# Patient Record
Sex: Female | Born: 1935 | Race: White | Hispanic: No | Marital: Married | Smoking: Former smoker
Health system: Southern US, Community
[De-identification: ages and names within clinical notes are randomized; demographics above are authoritative.]

## PROBLEM LIST (undated history)

## (undated) DIAGNOSIS — M1990 Unspecified osteoarthritis, unspecified site: Secondary | ICD-10-CM

## (undated) DIAGNOSIS — G43909 Migraine, unspecified, not intractable, without status migrainosus: Secondary | ICD-10-CM

## (undated) DIAGNOSIS — F0390 Unspecified dementia without behavioral disturbance: Secondary | ICD-10-CM

## (undated) DIAGNOSIS — E039 Hypothyroidism, unspecified: Secondary | ICD-10-CM

## (undated) DIAGNOSIS — I10 Essential (primary) hypertension: Secondary | ICD-10-CM

## (undated) DIAGNOSIS — E7800 Pure hypercholesterolemia, unspecified: Secondary | ICD-10-CM

## (undated) DIAGNOSIS — Z8739 Personal history of other diseases of the musculoskeletal system and connective tissue: Secondary | ICD-10-CM

## (undated) DIAGNOSIS — Z9289 Personal history of other medical treatment: Secondary | ICD-10-CM

## (undated) HISTORY — PX: CHOLECYSTECTOMY: SHX55

## (undated) HISTORY — PX: DILATION AND CURETTAGE OF UTERUS: SHX78

## (undated) HISTORY — PX: JOINT REPLACEMENT: SHX530

## (undated) HISTORY — PX: ABDOMINAL HYSTERECTOMY: SHX81

## (undated) HISTORY — PX: TOTAL HIP ARTHROPLASTY: SHX124

## (undated) HISTORY — PX: FRACTURE SURGERY: SHX138

## (undated) HISTORY — PX: TUBAL LIGATION: SHX77

---

## 1986-07-04 HISTORY — PX: ANKLE FRACTURE SURGERY: SHX122

## 2000-02-29 ENCOUNTER — Encounter: Payer: Self-pay | Admitting: Internal Medicine

## 2000-02-29 ENCOUNTER — Ambulatory Visit (HOSPITAL_COMMUNITY): Admission: RE | Admit: 2000-02-29 | Discharge: 2000-02-29 | Payer: Self-pay | Admitting: Internal Medicine

## 2000-07-18 ENCOUNTER — Encounter: Payer: Self-pay | Admitting: Orthopedic Surgery

## 2000-07-19 ENCOUNTER — Encounter: Payer: Self-pay | Admitting: Orthopedic Surgery

## 2000-07-19 ENCOUNTER — Inpatient Hospital Stay (HOSPITAL_COMMUNITY): Admission: RE | Admit: 2000-07-19 | Discharge: 2000-07-21 | Payer: Self-pay | Admitting: Orthopedic Surgery

## 2001-03-07 ENCOUNTER — Ambulatory Visit (HOSPITAL_COMMUNITY): Admission: RE | Admit: 2001-03-07 | Discharge: 2001-03-07 | Payer: Self-pay | Admitting: Internal Medicine

## 2001-03-07 ENCOUNTER — Encounter: Payer: Self-pay | Admitting: Internal Medicine

## 2002-03-08 ENCOUNTER — Ambulatory Visit (HOSPITAL_COMMUNITY): Admission: RE | Admit: 2002-03-08 | Discharge: 2002-03-08 | Payer: Self-pay | Admitting: Internal Medicine

## 2002-03-08 ENCOUNTER — Encounter: Payer: Self-pay | Admitting: Internal Medicine

## 2003-03-11 ENCOUNTER — Encounter: Payer: Self-pay | Admitting: Internal Medicine

## 2003-03-11 ENCOUNTER — Ambulatory Visit (HOSPITAL_COMMUNITY): Admission: RE | Admit: 2003-03-11 | Discharge: 2003-03-11 | Payer: Self-pay | Admitting: Internal Medicine

## 2003-12-08 ENCOUNTER — Encounter (INDEPENDENT_AMBULATORY_CARE_PROVIDER_SITE_OTHER): Payer: Self-pay | Admitting: Specialist

## 2003-12-08 ENCOUNTER — Ambulatory Visit (HOSPITAL_COMMUNITY): Admission: RE | Admit: 2003-12-08 | Discharge: 2003-12-08 | Payer: Self-pay | Admitting: Gastroenterology

## 2004-03-26 ENCOUNTER — Ambulatory Visit (HOSPITAL_COMMUNITY): Admission: RE | Admit: 2004-03-26 | Discharge: 2004-03-26 | Payer: Self-pay | Admitting: Internal Medicine

## 2004-11-17 ENCOUNTER — Encounter: Admission: RE | Admit: 2004-11-17 | Discharge: 2004-11-17 | Payer: Self-pay | Admitting: Internal Medicine

## 2005-01-18 ENCOUNTER — Encounter: Admission: RE | Admit: 2005-01-18 | Discharge: 2005-01-18 | Payer: Self-pay | Admitting: Orthopedic Surgery

## 2005-01-20 ENCOUNTER — Ambulatory Visit (HOSPITAL_BASED_OUTPATIENT_CLINIC_OR_DEPARTMENT_OTHER): Admission: RE | Admit: 2005-01-20 | Discharge: 2005-01-20 | Payer: Self-pay | Admitting: Orthopedic Surgery

## 2005-01-20 ENCOUNTER — Ambulatory Visit (HOSPITAL_COMMUNITY): Admission: RE | Admit: 2005-01-20 | Discharge: 2005-01-20 | Payer: Self-pay | Admitting: Orthopedic Surgery

## 2005-05-17 ENCOUNTER — Ambulatory Visit (HOSPITAL_COMMUNITY): Admission: RE | Admit: 2005-05-17 | Discharge: 2005-05-17 | Payer: Self-pay | Admitting: Internal Medicine

## 2006-04-26 ENCOUNTER — Encounter: Admission: RE | Admit: 2006-04-26 | Discharge: 2006-04-26 | Payer: Self-pay | Admitting: Internal Medicine

## 2006-06-15 ENCOUNTER — Encounter: Admission: RE | Admit: 2006-06-15 | Discharge: 2006-06-15 | Payer: Self-pay | Admitting: Internal Medicine

## 2008-04-30 ENCOUNTER — Ambulatory Visit (HOSPITAL_COMMUNITY): Admission: RE | Admit: 2008-04-30 | Discharge: 2008-04-30 | Payer: Self-pay | Admitting: Internal Medicine

## 2008-12-15 ENCOUNTER — Ambulatory Visit: Admission: RE | Admit: 2008-12-15 | Discharge: 2008-12-15 | Payer: Self-pay | Admitting: Orthopedic Surgery

## 2008-12-19 ENCOUNTER — Inpatient Hospital Stay (HOSPITAL_COMMUNITY): Admission: RE | Admit: 2008-12-19 | Discharge: 2008-12-22 | Payer: Self-pay | Admitting: Orthopedic Surgery

## 2009-07-07 ENCOUNTER — Ambulatory Visit (HOSPITAL_COMMUNITY): Admission: RE | Admit: 2009-07-07 | Discharge: 2009-07-07 | Payer: Self-pay | Admitting: Internal Medicine

## 2010-10-11 LAB — CROSSMATCH
ABO/RH(D): A NEG
Antibody Screen: POSITIVE
DAT, IgG: NEGATIVE

## 2010-10-11 LAB — BASIC METABOLIC PANEL
BUN: 15 mg/dL (ref 6–23)
BUN: 8 mg/dL (ref 6–23)
CO2: 29 mEq/L (ref 19–32)
Calcium: 8.5 mg/dL (ref 8.4–10.5)
Calcium: 8.8 mg/dL (ref 8.4–10.5)
Chloride: 98 mEq/L (ref 96–112)
Creatinine, Ser: 1.05 mg/dL (ref 0.4–1.2)
Creatinine, Ser: 1.19 mg/dL (ref 0.4–1.2)
GFR calc Af Amer: 53 mL/min — ABNORMAL LOW (ref >60)
GFR calc Af Amer: >60 mL/min (ref >60)
GFR calc non Af Amer: 44 mL/min — ABNORMAL LOW (ref >60)
GFR calc non Af Amer: 58 mL/min — ABNORMAL LOW (ref >60)
Glucose, Bld: 138 mg/dL — ABNORMAL HIGH (ref 70–99)
Potassium: 3.4 mEq/L — ABNORMAL LOW (ref 3.5–5.1)
Potassium: 4.3 mEq/L (ref 3.5–5.1)
Sodium: 133 mEq/L — ABNORMAL LOW (ref 135–145)

## 2010-10-11 LAB — COMPREHENSIVE METABOLIC PANEL
ALT: 19 U/L (ref 0–35)
AST: 33 U/L (ref 0–37)
Albumin: 4.5 g/dL (ref 3.5–5.2)
Creatinine, Ser: 1.23 mg/dL — ABNORMAL HIGH (ref 0.4–1.2)
Glucose, Bld: 112 mg/dL — ABNORMAL HIGH (ref 70–99)
Potassium: 4.3 mEq/L (ref 3.5–5.1)
Sodium: 136 mEq/L (ref 135–145)
Total Bilirubin: 0.8 mg/dL (ref 0.3–1.2)
Total Protein: 7.8 g/dL (ref 6.0–8.3)

## 2010-10-11 LAB — CBC
HCT: 30.6 % — ABNORMAL LOW (ref 36.0–46.0)
HCT: 31.3 % — ABNORMAL LOW (ref 36.0–46.0)
HCT: 34.7 % — ABNORMAL LOW (ref 36.0–46.0)
Hemoglobin: 10.5 g/dL — ABNORMAL LOW (ref 12.0–15.0)
Hemoglobin: 10.8 g/dL — ABNORMAL LOW (ref 12.0–15.0)
Hemoglobin: 11.8 g/dL — ABNORMAL LOW (ref 12.0–15.0)
MCHC: 34.2 g/dL (ref 30.0–36.0)
MCHC: 34.4 g/dL (ref 30.0–36.0)
MCV: 93.5 fL (ref 78.0–100.0)
Platelets: 225 10*3/uL (ref 150–400)
Platelets: 256 10*3/uL (ref 150–400)
RDW: 13 % (ref 11.5–15.5)
RDW: 13.3 % (ref 11.5–15.5)
RDW: 13.3 % (ref 11.5–15.5)
WBC: 7 10*3/uL (ref 4.0–10.5)
WBC: 7.8 10*3/uL (ref 4.0–10.5)
WBC: 7.1 10*3/uL (ref 4.0–10.5)

## 2010-10-11 LAB — PROTIME-INR
INR: 1.1 (ref 0.00–1.49)
Prothrombin Time: 14.5 seconds (ref 11.6–15.2)
Prothrombin Time: 15.9 seconds — ABNORMAL HIGH (ref 11.6–15.2)
Prothrombin Time: 17.4 seconds — ABNORMAL HIGH (ref 11.6–15.2)
Prothrombin Time: 13.2 seconds (ref 11.6–15.2)

## 2010-10-11 LAB — APTT: aPTT: 31 seconds (ref 24–37)

## 2010-11-16 NOTE — Op Note (Signed)
[name], Diamond [name] NO.:  [id number]   MEDICAL RECORD NO.:  [id number]          PATIENT TYPE:  INP   LOCATION:  5040                         FACILITY:  [facility]   PHYSICIAN:  Nadara Mustard, MD     DATE OF BIRTH:  09/20/1926   DATE OF PROCEDURE:  12/19/2008  DATE OF DISCHARGE:                               OPERATIVE REPORT   PREOPERATIVE DIAGNOSIS:  Osteoarthritis, right hip.   POSTOPERATIVE DIAGNOSIS:  Osteoarthritis, right hip.   PROCEDURE:  Right total hip arthroplasty with DePuy components, 54-mm  acetabulum, 10-degree +4 polyethylene liner, 36-mm head with a -2 neck.   SURGEON:  Nadara Mustard, MD   ANESTHESIA:  General.   ESTIMATED BLOOD LOSS:  Minimal.   ANTIBIOTICS:  Vancomycin 1 g preoperatively and intraoperatively.   DISPOSITION:  To PACU in stable condition.   INDICATIONS FOR PROCEDURE:  The patient is an 77 year old woman with an  abductor lurch pain with activities of daily living who has failed  conservative care and presents at this time for right total hip  arthroplasty.  Risks and benefits were discussed including infection,  neurovascular injury, persistent pain, dislocation, DVT, pulmonary  embolus, and need for additional surgery.  The patient states she  understands and wished to proceed at this time.   DESCRIPTION FOR PROCEDURE:  The patient was brought to OR room #1 and  underwent a general anesthetic.  After adequate level of anesthesia was  obtained, the patient was placed in the left lateral decubitus position  with the right side up and the right lower extremity was prepped using  DuraPrep and draped into a sterile field.  An Collier Flowers was used to cover  all exposed skin.  A posterolateral incision was made and this carried  down.  The tensor fascia lata was split.  Retractors were placed.  The  piriformis and short external rotators and capsule was tagged, cut, and  retracted off the femoral neck.  The femoral head was  dislocated.  The  femoral neck cut was made 1 cm proximal to the calcar.  Attention was  first focused on the acetabulum.  The acetabulum was sequentially reamed  up to a 54-mm acetabulum.  The acetabular was placed in 45 degrees of  abduction and 20 degrees of anteversion.  One screw 20 mm in length was  placed superiorly to stabilize the cup.  The 10-degree +4 polyethylene  liner was placed.  The femur was then sequentially broached up to a size  10.  The size 10 femur was then trialed with different size, -2 neck,  had equal leg length, negative shuck, full flexion of 120 degrees, full  abduction, internal rotation of 70 degrees and the hip was stable.  The  trial components were then removed.  The hip was irrigated continuously  throughout the case.  The final size 10 stem +2, -2 head was inserted.  This was reduced, again placed through a full range of motion.  She had  full extension, external rotation, flexion, internal rotation of 70  degrees with stable hip.  The wound was irrigated with  normal saline.  The piriformis, short external rotators, and capsule were reapproximated  using #1 Ethibond.  The tensor fascia lata was closed using #1 Vicryl.  Subcu was closed using 2-0 Vicryl.  Skin was closed using approximating  staples.  The wound was covered with Adaptic orthopedic sponges, ABD  dressing, and Hypafix tape.  The patient was extubated and taken to the  PACU in stable condition.      Nadara Mustard, MD  Electronically Signed     MVD/MEDQ  D:  12/19/2008  T:  12/20/2008  Job:  [id number]

## 2010-11-19 NOTE — Op Note (Signed)
NAMEJULINE, SANDERFORD NO.:  [id number]   MEDICAL RECORD NO.:  [id number]          PATIENT TYPE:  AMB   LOCATION:  DSC                          FACILITY:  [facility]   PHYSICIAN:  Nadara Mustard, MD     DATE OF BIRTH:  Jul 24, 1926   DATE OF PROCEDURE:  01/20/2005  DATE OF DISCHARGE:                                 OPERATIVE REPORT   PREOPERATIVE DIAGNOSIS:  Hallux valgus deformity of right great toe.   POSTOPERATIVE DIAGNOSIS:  Hallux valgus deformity of right great toe.   PROCEDURE:  Chevron osteotomy of first metatarsal, right great toe.   SURGEON.:  Nadara Mustard, MD   ANESTHESIA:  Ankle block.   ESTIMATED BLOOD LOSS:  Minimal.   ANTIBIOTICS:  Cleocin 300 mg IV.   DRAINS:  None.   COMPLICATIONS:  None.   TOURNIQUET TIME:  Esmarch at the ankle for approximately 20 minutes.   DISPOSITION:  To PACU in stable condition.   INDICATION FOR PROCEDURE:  The patient is a 77 year old woman with painful  hallux valgus deformity of her right great toe.  The patient has failed  conservative care and presents at this time for surgical intervention.  The  risks and benefits were discussed including infection, neurovascular injury,  persistent pain, and need for additional surgery.  The patient states she  understands and wishes to proceed at this time.   DESCRIPTION OF PROCEDURE:  The patient underwent an ankle block in the  holding area, then was brought to OR 6.  After adequate level of anesthesia  was obtained, the patient's right lower extremity was prepped using DuraPrep  and draped into a sterile field.  The leg was elevated and Esmarch was  wrapped around the ankle for tourniquet control.  A longitudinal medial  incision was made; this was carried down through the retinaculum.  A  football shape of the retinaculum was ellipsed from the plantar aspect to  locate the sesamoids.  An ostectomy was first performed and then a Chevron  osteotomy.  The metatarsal head  was translated laterally approximately 4 mm.  This was stabilized with a 0.0625 K-wire and a second ostectomy was then  again performed.  The wound was irrigated with normal saline.  The patient  did have some tophaceous gout changes secondary to chronic gout.  The  tophaceous material was removed from the joint.  The toe was held adducted  and the retinacular was closed using 2-0 Vicryl and the skin was closed  using far-near/near-far suture with 2-0 nylon.  The wound was covered with  Adaptic, orthopedic sponges, sterile Webril and a Coban dressing.  The  patient was then taken to the PACU in stable condition.   Plan for touchdown weightbearing and followup the office in 2 weeks.       MVD/MEDQ  D:  01/20/2005  T:  01/21/2005  Job:  [id number]

## 2010-11-19 NOTE — Op Note (Signed)
[place]. [facility]  Patient:    Diamond [name], Diamond [name]                      MRN: [id number] Proc. Date: 07/19/00 Attending:  Nadara Mustard, M.D.                           Operative Report  PREOPERATIVE DIAGNOSIS:  Osteoarthritis and collapse of the left ankle and left subtalar joint.  POSTOPERATIVE DIAGNOSIS:  Osteoarthritis and collapse of the left ankle and left subtalar joint.  OPERATION PERFORMED: 1. Left tibial calcaneal fusion with a Katrinka Blazing Nephews revision nail,    11 mm x 15 cm, locked proximally and distally. 2. Removal of deep buried hardware, left fibula.  SURGEON:  Nadara Mustard, M.D.  ANESTHESIA:  General endotracheal.  ESTIMATED BLOOD LOSS:  Minimal.  ANTIBIOTICS: 600 mg of Clindamycin IV preoperatively.  TOURNIQUET TIME:  84 minutes at 300 mmHg.  DRAINS:  Ivette Loyal compressive dressing.  DISPOSITION:  To PACU in stable condition.  INDICATIONS FOR PROCEDURE:  The patient is a 77 year old woman who has been unable to perform activities of daily living due to degenerative collapse of her left ankle, left subtalar joint and presents at this time for surgical intervention.  The risks and benefits of surgery were discussed including infection, neurovascular injury, failure of the hardware, need for additional surgery, failure of fusion.  The patient states that he understands and wishes to proceed at this time.  DESCRIPTION OF PROCEDURE:  The patient was brought to the operating room 5 and underwent a general endotracheal anesthetic.  After an adequate level of anesthesia was obtained, the patients left lower extremity was prepped using DuraPrep, draped into a sterile field and Ioban was used to cover all exposed skin.  The leg was elevated and tourniquet inflated 300 mmHg.  A lateral incision was made over the fibula.  Using deep subperiosteal dissection, the intramedullary implant was identified and was removed. the fibular  excision at the distal aspect of the fibula was excised and this was used for bone graft.  Using the saw and the osteotomes, the distal tibia and talar dome were resected parallel to the floor with the foot at 90/90.  This was carried over and debridement of the medial malleolus was also performed.  The sclerotic subtalar joint was also debrided parallel to the joint surface.  This was reduced with the patient in slight amount of valgus and the ankle at 90 degrees.  The skin incision was made plantarly.  Blunt dissection was carried down to the calcaneus.  A guide wire was inserted from the calcaneus to the tibia and this was checked with a C-arm in AP and lateral planes.  This was overreamed to a 10.5 for the 11 mm nail.  The nail was inserted with the guide instrument laterally.  Two screws were placed distally and one screw proximally.  The alignment was then checked and the screw location was verified.  The wounds were irrigated with normal saline.  The deep fissure was closed using 2-0 Vicryl.  The skin was closed using a vertical mattress with 2-0 nylon.  There was no tension on the skin.  The wound was covered with Adaptic, orthopedic sponges and a compressive Ivette Loyal dressing was applied.  The tourniquet was deflated after application of the dressing. There was good capillary refill in the toes.  The patient was extubated, taken to  post anesthesia care unit in stable condition.  Plan for discharge when stable with ambulation, nonweightbearing on the left.  Follow-up in the office in two weeks. DD:  07/19/00 TD:  07/19/00 Job: [id number]

## 2010-11-19 NOTE — Discharge Summary (Signed)
NAMELONI, DELBRIDGE NO.:  [id number]   MEDICAL RECORD NO.:  [id number]          PATIENT TYPE:  INP   LOCATION:  5040                         FACILITY:  [facility]   PHYSICIAN:  Nadara Mustard, MD     DATE OF BIRTH:  24-Sep-1926   DATE OF ADMISSION:  12/19/2008  DATE OF DISCHARGE:  12/22/2008                               DISCHARGE SUMMARY   FINAL DIAGNOSIS:  Osteoarthritis, right hip.   PROCEDURE:  Right total hip arthroplasty.   Discharged to home in stable condition with [facility] for  physical therapy.  Prescriptions for Coumadin and Tylox.  Followup in  the office in 2 weeks.   HISTORY OF PRESENT ILLNESS:  The patient is an 77 year old woman with  osteoarthritis of her right hip.  She has failed conservative care and  pain with activities of daily living and presented at this time for  total hip arthroplasty.  The patient underwent total hip arthroplasty on  December 19, 2008.  She received DePuy components with a 54-mm acetabulum,  10-degree +4 polyethylene liner, 36-mm head, -2 neck, and a #10 stem.  She received vancomycin for infection prophylaxis.  Postoperatively, the  patient progressed well.  She was maintained on antibiotics for 24 hours  for infection prophylaxis.  She was maintained on Coumadin for DVT  prophylaxis.  She progressed well with her physical therapy and was  discharged to home in stable condition on June 21 with followup in the  office in 2 weeks.      Nadara Mustard, MD  Electronically Signed     MVD/MEDQ  D:  02/05/2009  T:  02/05/2009  Job:  [id number]

## 2010-11-19 NOTE — Op Note (Signed)
NAME:  SRI, CLEGG                       ACCOUNT NO.:  [id number]   MEDICAL RECORD NO.:  [id number]                   PATIENT TYPE:  AMB   LOCATION:  ENDO                                 FACILITY:  [facility]   PHYSICIAN:  Danise Edge, M.D.                DATE OF BIRTH:  [birth year]   DATE OF PROCEDURE:  12/08/2003  DATE OF DISCHARGE:                                 OPERATIVE REPORT   PROCEDURE INDICATION:  Ms. Shamone Winzer is a 77 year old female born 01/24/1927.  Ms. Wank is scheduled to undergo her first screening  colonoscopy with polypectomy to prevent colon cancer.   ENDOSCOPIST:  Danise Edge, M.D.   PREMEDICATION:  1. Versed 5 mg.  2. Demerol 50 mg.   PROCEDURE:  After obtaining informed consent Ms. Krontz was placed in the  left lateral decubitus position.  I administered intravenous Demerol and  intravenous Versed to achieve conscious sedation for the procedure.  The  patient's blood pressure, oxygen saturation and cardiac rhythm were  monitored throughout the procedure and documented in the medical record.   Anal inspection and digital rectal exam were normal.  The Olympus adjustable  pediatric colonoscope was introduced into the rectum and advanced to the  cecum.  Colonic preparation for exam today was excellent.   Rectum.  Approximately four diminutive (less than 1 mm) sessile polyps were  removed from the mid-distal rectum with the hot biopsy forceps.  From the  distal rectum a 3 mm sessile polyp was removed with the electrocautery  snare.   Sigmoid colon and descending colon.  Left colonic diverticulosis.   Splenic flexure normal.   Transverse colon.  From the proximal transverse colon a 1 mm sessile polyp  was removed with the cold biopsy forceps.   Hepatic flexure normal.   Ascending colon normal.   Cecum and ileocecal valve normal.   ASSESSMENT:  1. Left colonic diverticulosis.  2. A diminutive polyp was removed from the proximal  transverse colon and     multiple diminutive polyps were     removed from the mid-distal rectum.  All polyps were submitted in one     bottle for pathologic evaluation.  3. From the distal rectum, a 3 mm sessile polyp was removed with     electrocautery snare and submitted for pathologic interpretation.                                               Danise Edge, M.D.    MJ/MEDQ  D:  12/08/2003  T:  12/08/2003  Job:  [id number]

## 2010-11-19 NOTE — H&P (Signed)
[place]. [facility]  Patient:    Diamond [name], Diamond [name]                      MRN: [id number] Adm. Date:  07/19/00 Attending:  Nadara Mustard, M.D.                         History and Physical  HISTORY OF PRESENT ILLNESS:  The patient is a 77 year old woman who has had an increasing two-month history of pain in her left ankle, unable to perform activities of daily living, unable to perform the sports activities that she likes to perform.  She is status post an open reduction, internal fixation with a intramedullary rod placed up the fibula on June 04, 1986.  The patient has a radiographically complete collapse of the ankle, pain with ADLs, and also has significant sclerotic changes in the subtalar joint, and presents at this time for a tibial to calcaneal fusion.  ALLERGIES:  PENICILLIN causes HER BODY AND TONGUE TO SWELL.  CODEINE causes NAUSEA AND VOMITING.  CURRENT MEDICATIONS: 1. Hydrochlorothiazide. 2. Synthroid. 3. Lipitor.  PAST SURGICAL HISTORY:  Positive for an ORIF of the left fibula in December 1987.  SOCIAL HISTORY:  Negative for tobacco.  Positive for occasional alcohol.  FAMILY HISTORY:  Positive for heart disease, hypertension, child born with a congenital heart defect.  REVIEW OF SYSTEMS:  Positive for hypertension, thyroid problems, high cholesterol, and arthritis.  PHYSICAL EXAMINATION:  VITAL SIGNS:  Temperature 96.4 degrees, respirations 16, pulse 72, blood pressure 162/78.  GENERAL:  She is in no acute distress.  LUNGS:  Clear to auscultation.  CARDIOVASCULAR:  A regular rate and rhythm.  NECK:  Supple, no bruit.  EXTREMITIES:  Range of motion of her left ankle is from -20 to 30 degrees, with pain with range of motion of the ankle, as well as less than 10 degrees of inversion and eversion of the subtalar joint, and pain with subtalar motion.  She has a good dorsalis pedis pulse.  RADIOGRAPHS:  Show the fibular rod with  complete collapse of the ankle and sclerotic changes of the subtalar joint.  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  PLAN:  Will schedule her for a tibial calcaneal fusion.  The risks and benefits were discussed, including infection, neurovascular injury, nonhealing of the bone, need for additional surgery, persistent pain. The patient states she understands and wishes to proceed at this time. DD:  07/19/00 TD:  07/19/00 Job: [id number]

## 2011-08-04 DIAGNOSIS — R195 Other fecal abnormalities: Secondary | ICD-10-CM | POA: Diagnosis not present

## 2011-08-11 DIAGNOSIS — E039 Hypothyroidism, unspecified: Secondary | ICD-10-CM | POA: Diagnosis not present

## 2011-08-11 DIAGNOSIS — E782 Mixed hyperlipidemia: Secondary | ICD-10-CM | POA: Diagnosis not present

## 2011-08-11 DIAGNOSIS — D649 Anemia, unspecified: Secondary | ICD-10-CM | POA: Diagnosis not present

## 2011-08-11 DIAGNOSIS — R195 Other fecal abnormalities: Secondary | ICD-10-CM | POA: Diagnosis not present

## 2011-08-11 DIAGNOSIS — I10 Essential (primary) hypertension: Secondary | ICD-10-CM | POA: Diagnosis not present

## 2011-09-20 DIAGNOSIS — M25569 Pain in unspecified knee: Secondary | ICD-10-CM | POA: Diagnosis not present

## 2011-09-20 DIAGNOSIS — M1A9XX1 Chronic gout, unspecified, with tophus (tophi): Secondary | ICD-10-CM | POA: Diagnosis not present

## 2011-09-20 DIAGNOSIS — M2020 Hallux rigidus, unspecified foot: Secondary | ICD-10-CM | POA: Diagnosis not present

## 2011-09-20 DIAGNOSIS — M25579 Pain in unspecified ankle and joints of unspecified foot: Secondary | ICD-10-CM | POA: Diagnosis not present

## 2011-10-11 DIAGNOSIS — M1A9XX1 Chronic gout, unspecified, with tophus (tophi): Secondary | ICD-10-CM | POA: Diagnosis not present

## 2011-10-11 DIAGNOSIS — M25579 Pain in unspecified ankle and joints of unspecified foot: Secondary | ICD-10-CM | POA: Diagnosis not present

## 2011-11-15 DIAGNOSIS — M79609 Pain in unspecified limb: Secondary | ICD-10-CM | POA: Diagnosis not present

## 2011-11-15 DIAGNOSIS — M7150 Other bursitis, not elsewhere classified, unspecified site: Secondary | ICD-10-CM | POA: Diagnosis not present

## 2011-11-15 DIAGNOSIS — M659 Synovitis and tenosynovitis, unspecified: Secondary | ICD-10-CM | POA: Diagnosis not present

## 2011-11-22 DIAGNOSIS — M7660 Achilles tendinitis, unspecified leg: Secondary | ICD-10-CM | POA: Diagnosis not present

## 2011-12-29 DIAGNOSIS — M2020 Hallux rigidus, unspecified foot: Secondary | ICD-10-CM | POA: Diagnosis not present

## 2011-12-29 DIAGNOSIS — M25569 Pain in unspecified knee: Secondary | ICD-10-CM | POA: Diagnosis not present

## 2011-12-29 DIAGNOSIS — M1A9XX1 Chronic gout, unspecified, with tophus (tophi): Secondary | ICD-10-CM | POA: Diagnosis not present

## 2012-02-09 DIAGNOSIS — E782 Mixed hyperlipidemia: Secondary | ICD-10-CM | POA: Diagnosis not present

## 2012-02-09 DIAGNOSIS — I10 Essential (primary) hypertension: Secondary | ICD-10-CM | POA: Diagnosis not present

## 2012-02-09 DIAGNOSIS — D649 Anemia, unspecified: Secondary | ICD-10-CM | POA: Diagnosis not present

## 2012-02-09 DIAGNOSIS — E039 Hypothyroidism, unspecified: Secondary | ICD-10-CM | POA: Diagnosis not present

## 2012-02-13 DIAGNOSIS — N3941 Urge incontinence: Secondary | ICD-10-CM | POA: Diagnosis not present

## 2012-03-14 DIAGNOSIS — Z23 Encounter for immunization: Secondary | ICD-10-CM | POA: Diagnosis not present

## 2012-03-15 DIAGNOSIS — M1A9XX1 Chronic gout, unspecified, with tophus (tophi): Secondary | ICD-10-CM | POA: Diagnosis not present

## 2012-03-15 DIAGNOSIS — M25559 Pain in unspecified hip: Secondary | ICD-10-CM | POA: Diagnosis not present

## 2012-03-15 DIAGNOSIS — M5137 Other intervertebral disc degeneration, lumbosacral region: Secondary | ICD-10-CM | POA: Diagnosis not present

## 2012-08-07 DIAGNOSIS — I10 Essential (primary) hypertension: Secondary | ICD-10-CM | POA: Diagnosis not present

## 2012-08-07 DIAGNOSIS — Z0000 Encounter for general adult medical examination without abnormal findings: Secondary | ICD-10-CM | POA: Diagnosis not present

## 2012-08-07 DIAGNOSIS — F329 Major depressive disorder, single episode, unspecified: Secondary | ICD-10-CM | POA: Diagnosis not present

## 2012-08-07 DIAGNOSIS — M949 Disorder of cartilage, unspecified: Secondary | ICD-10-CM | POA: Diagnosis not present

## 2012-08-07 DIAGNOSIS — E782 Mixed hyperlipidemia: Secondary | ICD-10-CM | POA: Diagnosis not present

## 2012-09-20 DIAGNOSIS — M899 Disorder of bone, unspecified: Secondary | ICD-10-CM | POA: Diagnosis not present

## 2012-09-20 DIAGNOSIS — M949 Disorder of cartilage, unspecified: Secondary | ICD-10-CM | POA: Diagnosis not present

## 2012-12-27 DIAGNOSIS — M5030 Other cervical disc degeneration, unspecified cervical region: Secondary | ICD-10-CM | POA: Diagnosis not present

## 2012-12-27 DIAGNOSIS — M47812 Spondylosis without myelopathy or radiculopathy, cervical region: Secondary | ICD-10-CM | POA: Diagnosis not present

## 2013-02-05 DIAGNOSIS — E782 Mixed hyperlipidemia: Secondary | ICD-10-CM | POA: Diagnosis not present

## 2013-02-05 DIAGNOSIS — E039 Hypothyroidism, unspecified: Secondary | ICD-10-CM | POA: Diagnosis not present

## 2013-02-05 DIAGNOSIS — I10 Essential (primary) hypertension: Secondary | ICD-10-CM | POA: Diagnosis not present

## 2013-02-18 DIAGNOSIS — N3020 Other chronic cystitis without hematuria: Secondary | ICD-10-CM | POA: Diagnosis not present

## 2013-02-18 DIAGNOSIS — N952 Postmenopausal atrophic vaginitis: Secondary | ICD-10-CM | POA: Diagnosis not present

## 2013-02-21 DIAGNOSIS — S5000XA Contusion of unspecified elbow, initial encounter: Secondary | ICD-10-CM | POA: Diagnosis not present

## 2013-02-26 DIAGNOSIS — Z23 Encounter for immunization: Secondary | ICD-10-CM | POA: Diagnosis not present

## 2013-03-05 DIAGNOSIS — M1A9XX1 Chronic gout, unspecified, with tophus (tophi): Secondary | ICD-10-CM | POA: Diagnosis not present

## 2013-03-11 DIAGNOSIS — E039 Hypothyroidism, unspecified: Secondary | ICD-10-CM | POA: Diagnosis not present

## 2013-04-17 ENCOUNTER — Encounter (HOSPITAL_BASED_OUTPATIENT_CLINIC_OR_DEPARTMENT_OTHER): Payer: Self-pay | Admitting: Emergency Medicine

## 2013-04-17 ENCOUNTER — Emergency Department (HOSPITAL_BASED_OUTPATIENT_CLINIC_OR_DEPARTMENT_OTHER)
Admission: EM | Admit: 2013-04-17 | Discharge: 2013-04-17 | Disposition: A | Discharge status: Home / Self Care | Payer: Medicare Other | Attending: Emergency Medicine | Admitting: Emergency Medicine

## 2013-04-17 ENCOUNTER — Emergency Department (HOSPITAL_BASED_OUTPATIENT_CLINIC_OR_DEPARTMENT_OTHER): Payer: Medicare Other

## 2013-04-17 DIAGNOSIS — R630 Anorexia: Secondary | ICD-10-CM | POA: Insufficient documentation

## 2013-04-17 DIAGNOSIS — E079 Disorder of thyroid, unspecified: Secondary | ICD-10-CM | POA: Insufficient documentation

## 2013-04-17 DIAGNOSIS — Z79899 Other long term (current) drug therapy: Secondary | ICD-10-CM | POA: Diagnosis not present

## 2013-04-17 DIAGNOSIS — I10 Essential (primary) hypertension: Secondary | ICD-10-CM | POA: Diagnosis not present

## 2013-04-17 DIAGNOSIS — R1110 Vomiting, unspecified: Secondary | ICD-10-CM | POA: Diagnosis not present

## 2013-04-17 DIAGNOSIS — R110 Nausea: Secondary | ICD-10-CM | POA: Diagnosis not present

## 2013-04-17 DIAGNOSIS — M109 Gout, unspecified: Secondary | ICD-10-CM | POA: Insufficient documentation

## 2013-04-17 DIAGNOSIS — Z880 Allergy status to penicillin: Secondary | ICD-10-CM | POA: Diagnosis not present

## 2013-04-17 DIAGNOSIS — N390 Urinary tract infection, site not specified: Secondary | ICD-10-CM | POA: Diagnosis not present

## 2013-04-17 DIAGNOSIS — R5381 Other malaise: Secondary | ICD-10-CM | POA: Insufficient documentation

## 2013-04-17 HISTORY — DX: Essential (primary) hypertension: I10

## 2013-04-17 LAB — URINALYSIS, ROUTINE W REFLEX MICROSCOPIC
Bilirubin Urine: NEGATIVE
Glucose, UA: NEGATIVE mg/dL
Ketones, ur: NEGATIVE mg/dL
pH: 5.5 (ref 5.0–8.0)

## 2013-04-17 LAB — CBC WITH DIFFERENTIAL/PLATELET
Basophils Absolute: 0 10*3/uL (ref 0.0–0.1)
Basophils Relative: 0 % (ref 0–1)
HCT: 31.4 % — ABNORMAL LOW (ref 36.0–46.0)
Hemoglobin: 10.5 g/dL — ABNORMAL LOW (ref 12.0–15.0)
Lymphocytes Relative: 6 % — ABNORMAL LOW (ref 12–46)
MCHC: 33.4 g/dL (ref 30.0–36.0)
MCV: 92.4 fL (ref 78.0–100.0)
Monocytes Absolute: 1 10*3/uL (ref 0.1–1.0)
Monocytes Relative: 7 % (ref 3–12)
Neutro Abs: 11.4 10*3/uL — ABNORMAL HIGH (ref 1.7–7.7)
Neutrophils Relative %: 86 % — ABNORMAL HIGH (ref 43–77)
Platelets: 263 10*3/uL (ref 150–400)
WBC: 13.1 10*3/uL — ABNORMAL HIGH (ref 4.0–10.5)

## 2013-04-17 LAB — COMPREHENSIVE METABOLIC PANEL
ALT: 16 U/L (ref 0–35)
AST: 26 U/L (ref 0–37)
Albumin: 3.7 g/dL (ref 3.5–5.2)
Alkaline Phosphatase: 106 U/L (ref 39–117)
BUN: 22 mg/dL (ref 6–23)
CO2: 23 mEq/L (ref 19–32)
Chloride: 98 mEq/L (ref 96–112)
Potassium: 3.5 mEq/L (ref 3.5–5.1)
Total Bilirubin: 1.2 mg/dL (ref 0.3–1.2)
Total Protein: 7.4 g/dL (ref 6.0–8.3)

## 2013-04-17 LAB — LIPASE, BLOOD: Lipase: 16 U/L (ref 11–59)

## 2013-04-17 MED ORDER — ONDANSETRON HCL 4 MG/2ML IJ SOLN
4.0000 mg | Freq: Once | INTRAMUSCULAR | Status: AC
  Administered 2013-04-17: 4 mg via INTRAVENOUS
  Filled 2013-04-17: qty 2

## 2013-04-17 MED ORDER — CEFPODOXIME PROXETIL 200 MG PO TABS: 100.0000 mg | ORAL_TABLET | Freq: Two times a day (BID) | ORAL | Status: AC

## 2013-04-17 MED ORDER — SODIUM CHLORIDE 0.9 % IV SOLN
1000.0000 mL | Freq: Once | INTRAVENOUS | Status: AC
  Administered 2013-04-17: 1000 mL via INTRAVENOUS

## 2013-04-17 MED ORDER — CEFPODOXIME PROXETIL 200 MG PO TABS
200.0000 mg | ORAL_TABLET | Freq: Once | ORAL | Status: AC
  Administered 2013-04-17: 200 mg via ORAL
  Filled 2013-04-17: qty 1

## 2013-04-17 MED ORDER — SODIUM CHLORIDE 0.9 % IV SOLN
Freq: Once | INTRAVENOUS | Status: AC
  Administered 2013-04-17: 13:00:00 via INTRAVENOUS

## 2013-04-17 NOTE — ED Notes (Signed)
MD at bedside. 

## 2013-04-17 NOTE — ED Provider Notes (Signed)
CSN: [id number]     Arrival date & time 04/17/13  1027 History   First MD Initiated Contact with Patient 04/17/13 1028     Chief Complaint  Patient presents with  . Nausea  . Emesis   (Consider location/radiation/quality/duration/timing/severity/associated sxs/prior Treatment) HPI Patient presents from home with concerns of nausea, vomiting, fatigue. Patient explicitly denies fever, dyspnea, chest pain, abdominal pain.  Symptoms began several days ago without clear precipitant.  Since onset symptoms have been progressive, with no clear alleviating or exacerbating factors. There is concurrent anorexia, with by mouth intolerance. Patient presents via EMS.  Providers report the patient was normotensive, awake and alert on their arrival, remained so throughout transport.  Past Medical History  Diagnosis Date  . Hypertension   . Thyroid disease   . Gout    No past surgical history on file. No family history on file. History  Substance Use Topics  . Smoking status: Not on file  . Smokeless tobacco: Not on file  . Alcohol Use: Not on file   OB History   Grav Para Term Preterm Abortions TAB SAB Ect Mult Living                 Review of Systems  Constitutional:       Per HPI, otherwise negative  HENT:       Per HPI, otherwise negative  Respiratory:       Per HPI, otherwise negative  Cardiovascular:       Per HPI, otherwise negative  Gastrointestinal: Positive for nausea and vomiting. Negative for abdominal pain and diarrhea.  Endocrine:       Negative aside from HPI  Genitourinary:       Neg aside from HPI   Musculoskeletal:       Per HPI, otherwise negative  Skin: Negative.   Neurological: Negative for syncope.    Allergies  Penicillins  Home Medications   Current Outpatient Rx  Name  Route  Sig  Dispense  Refill  . allopurinol (ZYLOPRIM) 100 MG tablet   Oral   Take 100 mg by mouth daily.         . citalopram (CELEXA) 10 MG tablet   Oral   Take 10 mg by  mouth daily.         . colchicine 0.6 MG tablet   Oral   Take 0.6 mg by mouth daily.         Marland Kitchen levothyroxine (SYNTHROID, LEVOTHROID) 100 MCG tablet   Oral   Take 100 mcg by mouth daily before breakfast.         . solifenacin (VESICARE) 10 MG tablet   Oral   Take by mouth daily.          BP 122/43  Pulse 88  Temp(Src) 99.3 F (37.4 C) (Oral)  Resp 16  Ht 5' (1.524 m)  Wt 128 lb (58.06 kg)  BMI 25 kg/m2  SpO2 92% Physical Exam  Nursing note and vitals reviewed. Constitutional: She is oriented to person, place, and time. She appears well-developed and well-nourished. No distress.  HENT:  Head: Normocephalic and atraumatic.  Eyes: Conjunctivae and EOM are normal.  Cardiovascular: Normal rate and regular rhythm.   Pulmonary/Chest: Effort normal and breath sounds normal. No stridor. No respiratory distress.  Abdominal: She exhibits no distension. There is no tenderness. There is no rebound.  Musculoskeletal: She exhibits no edema.  Neurological: She is alert and oriented to person, place, and time. No cranial nerve deficit.  Skin: Skin is warm and dry.  Psychiatric: She has a normal mood and affect.    ED Course  Procedures (including critical care time) Labs Review Labs Reviewed  CBC WITH DIFFERENTIAL - Abnormal; Notable for the following:    WBC 13.1 (*)    RBC 3.40 (*)    Hemoglobin 10.5 (*)    HCT 31.4 (*)    Neutrophils Relative % 86 (*)    Neutro Abs 11.4 (*)    Lymphocytes Relative 6 (*)    All other components within normal limits  COMPREHENSIVE METABOLIC PANEL  LIPASE, BLOOD  URINALYSIS, ROUTINE W REFLEX MICROSCOPIC  TROPONIN I  LACTIC ACID, PLASMA  CG4 I-STAT (LACTIC ACID)   Imaging Review Dg Chest 2 View  04/17/2013   CLINICAL DATA:  Nausea.  EXAM: CHEST  2 VIEW  COMPARISON:  12/15/2008.  FINDINGS: Trachea is midline. Heart size normal. Lungs are clear. No pleural fluid.  IMPRESSION: No acute findings.   Electronically Signed   By: Leanna Battles M.D.   On: 04/17/2013 11:15    EKG Interpretation   None      Cardiac monitor 75 sinus rhythm normal Pulse oximetry 100% room air normal   2:43 PM Patient substantially better.  She is talking freely, laughing. Patient's husband states that she feels substantially better as well.  Vital signs remained stable.  She and her husband are aware of all results, including evidence for urinary tract infection. MDM  No diagnosis found.  This elderly female presents from home with weakness and nausea.  On exam she is awake and alert, oriented x3 vital signs are initially unremarkable, and remained so throughout her emergency department course.  Patient's labs are most notable for evidence of ongoing renal dysfunction and mild leukocytosis with evidence of urinary tract infection.  The patient improved substantially here with fluid rehydration.  She was started on antibiotics, and given the absence of distress, fever, and with her substantial improvement, was appropriate for further evaluation and management as an outpatient.  She has both a primary care physician and a urologist with whom she can followup.   Gerhard Munch, MD 04/17/13 1444

## 2013-04-17 NOTE — ED Notes (Signed)
Patient preparing for discharge. 

## 2013-04-18 LAB — URINE CULTURE: Colony Count: >=100000

## 2013-04-19 DIAGNOSIS — N3020 Other chronic cystitis without hematuria: Secondary | ICD-10-CM | POA: Diagnosis not present

## 2013-04-19 DIAGNOSIS — N3090 Cystitis, unspecified without hematuria: Secondary | ICD-10-CM | POA: Diagnosis not present

## 2013-05-22 DIAGNOSIS — M1710 Unilateral primary osteoarthritis, unspecified knee: Secondary | ICD-10-CM | POA: Diagnosis not present

## 2013-07-31 DIAGNOSIS — M25529 Pain in unspecified elbow: Secondary | ICD-10-CM | POA: Diagnosis not present

## 2013-07-31 DIAGNOSIS — R29898 Other symptoms and signs involving the musculoskeletal system: Secondary | ICD-10-CM | POA: Diagnosis not present

## 2013-08-08 ENCOUNTER — Other Ambulatory Visit: Payer: Self-pay | Admitting: Internal Medicine

## 2013-08-08 DIAGNOSIS — F3289 Other specified depressive episodes: Secondary | ICD-10-CM | POA: Diagnosis not present

## 2013-08-08 DIAGNOSIS — N183 Chronic kidney disease, stage 3 unspecified: Secondary | ICD-10-CM | POA: Diagnosis not present

## 2013-08-08 DIAGNOSIS — Z1231 Encounter for screening mammogram for malignant neoplasm of breast: Secondary | ICD-10-CM

## 2013-08-08 DIAGNOSIS — M899 Disorder of bone, unspecified: Secondary | ICD-10-CM | POA: Diagnosis not present

## 2013-08-08 DIAGNOSIS — M949 Disorder of cartilage, unspecified: Secondary | ICD-10-CM | POA: Diagnosis not present

## 2013-08-08 DIAGNOSIS — I10 Essential (primary) hypertension: Secondary | ICD-10-CM | POA: Diagnosis not present

## 2013-08-08 DIAGNOSIS — F329 Major depressive disorder, single episode, unspecified: Secondary | ICD-10-CM | POA: Diagnosis not present

## 2013-08-08 DIAGNOSIS — E782 Mixed hyperlipidemia: Secondary | ICD-10-CM | POA: Diagnosis not present

## 2013-08-08 DIAGNOSIS — E039 Hypothyroidism, unspecified: Secondary | ICD-10-CM | POA: Diagnosis not present

## 2013-08-08 DIAGNOSIS — Z0000 Encounter for general adult medical examination without abnormal findings: Secondary | ICD-10-CM | POA: Diagnosis not present

## 2013-08-12 DIAGNOSIS — M19029 Primary osteoarthritis, unspecified elbow: Secondary | ICD-10-CM | POA: Diagnosis not present

## 2013-08-14 DIAGNOSIS — M25529 Pain in unspecified elbow: Secondary | ICD-10-CM | POA: Diagnosis not present

## 2013-08-23 ENCOUNTER — Ambulatory Visit
Admission: RE | Admit: 2013-08-23 | Discharge: 2013-08-23 | Disposition: A | Discharge status: Home / Self Care | Payer: Medicare Other | Source: Ambulatory Visit | Attending: Internal Medicine | Admitting: Internal Medicine

## 2013-08-23 DIAGNOSIS — E039 Hypothyroidism, unspecified: Secondary | ICD-10-CM | POA: Diagnosis not present

## 2013-08-23 DIAGNOSIS — Z1231 Encounter for screening mammogram for malignant neoplasm of breast: Secondary | ICD-10-CM | POA: Diagnosis not present

## 2013-11-04 DIAGNOSIS — N3941 Urge incontinence: Secondary | ICD-10-CM | POA: Diagnosis not present

## 2013-11-04 DIAGNOSIS — N3020 Other chronic cystitis without hematuria: Secondary | ICD-10-CM | POA: Diagnosis not present

## 2013-11-14 DIAGNOSIS — M5137 Other intervertebral disc degeneration, lumbosacral region: Secondary | ICD-10-CM | POA: Diagnosis not present

## 2013-11-14 DIAGNOSIS — S93419A Sprain of calcaneofibular ligament of unspecified ankle, initial encounter: Secondary | ICD-10-CM | POA: Diagnosis not present

## 2014-02-06 DIAGNOSIS — N183 Chronic kidney disease, stage 3 unspecified: Secondary | ICD-10-CM | POA: Diagnosis not present

## 2014-02-06 DIAGNOSIS — E782 Mixed hyperlipidemia: Secondary | ICD-10-CM | POA: Diagnosis not present

## 2014-02-06 DIAGNOSIS — M109 Gout, unspecified: Secondary | ICD-10-CM | POA: Diagnosis not present

## 2014-02-06 DIAGNOSIS — I10 Essential (primary) hypertension: Secondary | ICD-10-CM | POA: Diagnosis not present

## 2014-02-06 DIAGNOSIS — E039 Hypothyroidism, unspecified: Secondary | ICD-10-CM | POA: Diagnosis not present

## 2014-03-03 DIAGNOSIS — Z23 Encounter for immunization: Secondary | ICD-10-CM | POA: Diagnosis not present

## 2014-03-26 DIAGNOSIS — M1710 Unilateral primary osteoarthritis, unspecified knee: Secondary | ICD-10-CM | POA: Diagnosis not present

## 2014-03-26 DIAGNOSIS — M47817 Spondylosis without myelopathy or radiculopathy, lumbosacral region: Secondary | ICD-10-CM | POA: Diagnosis not present

## 2014-03-26 DIAGNOSIS — M5137 Other intervertebral disc degeneration, lumbosacral region: Secondary | ICD-10-CM | POA: Diagnosis not present

## 2014-05-12 DIAGNOSIS — N3020 Other chronic cystitis without hematuria: Secondary | ICD-10-CM | POA: Diagnosis not present

## 2014-06-03 ENCOUNTER — Emergency Department (HOSPITAL_BASED_OUTPATIENT_CLINIC_OR_DEPARTMENT_OTHER): Payer: Medicare Other

## 2014-06-03 ENCOUNTER — Encounter (HOSPITAL_BASED_OUTPATIENT_CLINIC_OR_DEPARTMENT_OTHER): Payer: Self-pay | Admitting: *Deleted

## 2014-06-03 ENCOUNTER — Emergency Department (HOSPITAL_BASED_OUTPATIENT_CLINIC_OR_DEPARTMENT_OTHER)
Admission: EM | Admit: 2014-06-03 | Discharge: 2014-06-03 | Disposition: A | Discharge status: Home / Self Care | Payer: Medicare Other | Source: Home / Self Care | Attending: Emergency Medicine | Admitting: Emergency Medicine

## 2014-06-03 DIAGNOSIS — Z96641 Presence of right artificial hip joint: Secondary | ICD-10-CM | POA: Diagnosis present

## 2014-06-03 DIAGNOSIS — Z791 Long term (current) use of non-steroidal anti-inflammatories (NSAID): Secondary | ICD-10-CM | POA: Diagnosis not present

## 2014-06-03 DIAGNOSIS — B9620 Unspecified Escherichia coli [E. coli] as the cause of diseases classified elsewhere: Secondary | ICD-10-CM | POA: Diagnosis not present

## 2014-06-03 DIAGNOSIS — R195 Other fecal abnormalities: Secondary | ICD-10-CM | POA: Diagnosis not present

## 2014-06-03 DIAGNOSIS — E039 Hypothyroidism, unspecified: Secondary | ICD-10-CM | POA: Diagnosis present

## 2014-06-03 DIAGNOSIS — S42202A Unspecified fracture of upper end of left humerus, initial encounter for closed fracture: Secondary | ICD-10-CM | POA: Diagnosis not present

## 2014-06-03 DIAGNOSIS — M109 Gout, unspecified: Secondary | ICD-10-CM | POA: Diagnosis present

## 2014-06-03 DIAGNOSIS — E785 Hyperlipidemia, unspecified: Secondary | ICD-10-CM | POA: Diagnosis present

## 2014-06-03 DIAGNOSIS — I10 Essential (primary) hypertension: Secondary | ICD-10-CM | POA: Diagnosis present

## 2014-06-03 DIAGNOSIS — R488 Other symbolic dysfunctions: Secondary | ICD-10-CM | POA: Diagnosis not present

## 2014-06-03 DIAGNOSIS — R262 Difficulty in walking, not elsewhere classified: Secondary | ICD-10-CM | POA: Diagnosis not present

## 2014-06-03 DIAGNOSIS — R531 Weakness: Secondary | ICD-10-CM | POA: Diagnosis not present

## 2014-06-03 DIAGNOSIS — S42292D Other displaced fracture of upper end of left humerus, subsequent encounter for fracture with routine healing: Secondary | ICD-10-CM | POA: Diagnosis not present

## 2014-06-03 DIAGNOSIS — Z880 Allergy status to penicillin: Secondary | ICD-10-CM | POA: Diagnosis not present

## 2014-06-03 DIAGNOSIS — F329 Major depressive disorder, single episode, unspecified: Secondary | ICD-10-CM | POA: Diagnosis not present

## 2014-06-03 DIAGNOSIS — D649 Anemia, unspecified: Secondary | ICD-10-CM | POA: Diagnosis present

## 2014-06-03 DIAGNOSIS — M6281 Muscle weakness (generalized): Secondary | ICD-10-CM | POA: Diagnosis not present

## 2014-06-03 DIAGNOSIS — N390 Urinary tract infection, site not specified: Secondary | ICD-10-CM | POA: Diagnosis not present

## 2014-06-03 DIAGNOSIS — E079 Disorder of thyroid, unspecified: Secondary | ICD-10-CM | POA: Diagnosis present

## 2014-06-03 DIAGNOSIS — S42302A Unspecified fracture of shaft of humerus, left arm, initial encounter for closed fracture: Secondary | ICD-10-CM | POA: Diagnosis not present

## 2014-06-03 DIAGNOSIS — Z0389 Encounter for observation for other suspected diseases and conditions ruled out: Secondary | ICD-10-CM | POA: Diagnosis not present

## 2014-06-03 DIAGNOSIS — R51 Headache: Secondary | ICD-10-CM | POA: Diagnosis not present

## 2014-06-03 DIAGNOSIS — W19XXXA Unspecified fall, initial encounter: Secondary | ICD-10-CM

## 2014-06-03 DIAGNOSIS — S42212A Unspecified displaced fracture of surgical neck of left humerus, initial encounter for closed fracture: Secondary | ICD-10-CM | POA: Diagnosis not present

## 2014-06-03 DIAGNOSIS — Z87891 Personal history of nicotine dependence: Secondary | ICD-10-CM | POA: Diagnosis not present

## 2014-06-03 DIAGNOSIS — K2971 Gastritis, unspecified, with bleeding: Secondary | ICD-10-CM | POA: Diagnosis not present

## 2014-06-03 DIAGNOSIS — N3281 Overactive bladder: Secondary | ICD-10-CM | POA: Diagnosis not present

## 2014-06-03 DIAGNOSIS — K922 Gastrointestinal hemorrhage, unspecified: Secondary | ICD-10-CM | POA: Diagnosis not present

## 2014-06-03 DIAGNOSIS — B999 Unspecified infectious disease: Secondary | ICD-10-CM | POA: Diagnosis not present

## 2014-06-03 DIAGNOSIS — S0990XA Unspecified injury of head, initial encounter: Secondary | ICD-10-CM | POA: Diagnosis not present

## 2014-06-03 DIAGNOSIS — W1830XD Fall on same level, unspecified, subsequent encounter: Secondary | ICD-10-CM | POA: Diagnosis not present

## 2014-06-03 DIAGNOSIS — R296 Repeated falls: Secondary | ICD-10-CM | POA: Diagnosis present

## 2014-06-03 LAB — CBC WITH DIFFERENTIAL/PLATELET
BASOS ABS: 0 10*3/uL (ref 0.0–0.1)
BASOS PCT: 0 % (ref 0–1)
EOS ABS: 0.3 10*3/uL (ref 0.0–0.7)
Eosinophils Relative: 3 % (ref 0–5)
HCT: 32.9 % — ABNORMAL LOW (ref 36.0–46.0)
Hemoglobin: 11.1 g/dL — ABNORMAL LOW (ref 12.0–15.0)
Lymphocytes Relative: 28 % (ref 12–46)
Lymphs Abs: 2.9 10*3/uL (ref 0.7–4.0)
MCH: 31.7 pg (ref 26.0–34.0)
MCHC: 33.7 g/dL (ref 30.0–36.0)
MCV: 94 fL (ref 78.0–100.0)
MONOS PCT: 6 % (ref 3–12)
Monocytes Absolute: 0.7 10*3/uL (ref 0.1–1.0)
NEUTROS PCT: 63 % (ref 43–77)
Neutro Abs: 6.6 10*3/uL (ref 1.7–7.7)
Platelets: 269 10*3/uL (ref 150–400)
RBC: 3.5 MIL/uL — ABNORMAL LOW (ref 3.87–5.11)
RDW: 13 % (ref 11.5–15.5)
WBC: 10.4 10*3/uL (ref 4.0–10.5)

## 2014-06-03 LAB — BASIC METABOLIC PANEL
ANION GAP: 16 — AB (ref 5–15)
BUN: 23 mg/dL (ref 6–23)
CHLORIDE: 97 meq/L (ref 96–112)
CO2: 25 mEq/L (ref 19–32)
CREATININE: 1.3 mg/dL — AB (ref 0.50–1.10)
Calcium: 9.7 mg/dL (ref 8.4–10.5)
GFR, EST AFRICAN AMERICAN: 42 mL/min — AB (ref >90)
GFR, EST NON AFRICAN AMERICAN: 36 mL/min — AB (ref >90)
Glucose, Bld: 122 mg/dL — ABNORMAL HIGH (ref 70–99)
POTASSIUM: 3.9 meq/L (ref 3.7–5.3)
Sodium: 138 mEq/L (ref 137–147)

## 2014-06-03 MED ORDER — OXYCODONE-ACETAMINOPHEN 5-325 MG PO TABS: 2.0000 | ORAL_TABLET | ORAL | Status: DC | PRN

## 2014-06-03 NOTE — Discharge Instructions (Signed)
Humerus Fracture, Treated with Immobilization  The humerus is the large bone in your upper arm. You have a broken (fractured) humerus. These fractures are easily diagnosed with X-rays.  TREATMENT   Simple fractures which will heal without disability are treated with simple immobilization. Immobilization means you will wear a cast, splint, or sling. You have a fracture which will do well with immobilization. The fracture will heal well simply by being held in a good position until it is stable enough to begin range of motion exercises. Do not take part in activities which would further injure your arm.   HOME CARE INSTRUCTIONS    Put ice on the injured area.   Put ice in a plastic bag.   Place a towel between your skin and the bag.   Leave the ice on for 15-20 minutes, 03-04 times a day.   If you have a cast:   Do not scratch the skin under the cast using sharp or pointed objects.   Check the skin around the cast every day. You may put lotion on any red or sore areas.   Keep your cast dry and clean.   If you have a splint:   Wear the splint as directed.   Keep your splint dry and clean.   You may loosen the elastic around the splint if your fingers become numb, tingle, or turn cold or blue.   If you have a sling:   Wear the sling as directed.   Do not put pressure on any part of your cast or splint until it is fully hardened.   Your cast or splint can be protected during bathing with a plastic bag. Do not lower the cast or splint into water.   Only take over-the-counter or prescription medicines for pain, discomfort, or fever as directed by your caregiver.   Do range of motion exercises as instructed by your caregiver.   Follow up as directed by your caregiver. This is very important in order to avoid permanent injury or disability and chronic pain.  SEEK IMMEDIATE MEDICAL CARE IF:    Your skin or nails in the injured arm turn blue or gray.   Your arm feels cold or numb.   You develop severe  pain in the injured arm.   You are having problems with the medicines you were given.  MAKE SURE YOU:    Understand these instructions.   Will watch your condition.   Will get help right away if you are not doing well or get worse.  Document Released: 09/26/2000 Document Revised: 09/12/2011 Document Reviewed: 08/04/2010  [employer] Patient Information 2015 [employer]. This information is not intended to replace advice given to you by your health care provider. Make sure you discuss any questions you have with your health care provider.

## 2014-06-03 NOTE — ED Provider Notes (Signed)
CSN: [id number]     Arrival date & time 06/03/14  2028 History  This chart was scribed for Mirian MoMatthew Tanessa Tidd, MD by Gwenyth Oberatherine Macek, ED Scribe. This patient was seen in room MH12/MH12 and the patient's care was started at 8:58 PM.    Chief Complaint  Patient presents with  . Shoulder Injury   The history is provided by the patient. No language interpreter was used.    HPI Comments: Diamond [name] is a 80 y.o. female who presents to the Emergency Department complaining of constant, moderate left shoulder pain that extends to her elbow and started 1 hour ago after she fell on her kitchen floor. She notes mild headache as an associated symptom. Pt states she was getting her medicine from a cabinet while wearing socks and slipped. She hit her head, but did not lose consciousness after the fall. Pt notes pain becomes worse with movement. She last ate 3 hours ago. Pt denies taking anti-coagulants. She denies neck pain, back pain, abdominal pain, chest pain, nausea, diarrhea, constipation, vomiting and fever as associated symptoms.  Timing of symptoms is constant.  Past Medical History  Diagnosis Date  . Hypertension   . Thyroid disease   . Gout    Past Surgical History  Procedure Laterality Date  . Tubal ligation    . Joint replacement     History reviewed. No pertinent family history. History  Substance Use Topics  . Smoking status: Never Smoker   . Smokeless tobacco: Not on file  . Alcohol Use: No   OB History    No data available     Review of Systems  Constitutional: Negative for fever.  Cardiovascular: Negative for chest pain.  Gastrointestinal: Negative for nausea, vomiting, abdominal pain, diarrhea and constipation.  Musculoskeletal: Positive for arthralgias. Negative for back pain and neck pain.  Neurological: Positive for headaches.  All other systems reviewed and are negative.  Allergies  Penicillins  Home Medications   Prior to Admission medications   Medication  Sig Start Date End Date Taking? Authorizing Provider  allopurinol (ZYLOPRIM) 100 MG tablet Take 100 mg by mouth daily.    Historical Provider, MD  citalopram (CELEXA) 10 MG tablet Take 10 mg by mouth daily.    Historical Provider, MD  colchicine 0.6 MG tablet Take 0.6 mg by mouth daily.    Historical Provider, MD  levothyroxine (SYNTHROID, LEVOTHROID) 100 MCG tablet Take 100 mcg by mouth daily before breakfast.    Historical Provider, MD  oxyCODONE-acetaminophen (PERCOCET/ROXICET) 5-325 MG per tablet Take 2 tablets by mouth every 4 (four) hours as needed for severe pain. 06/03/14   Mirian MoMatthew Dorise Gangi, MD  solifenacin (VESICARE) 10 MG tablet Take by mouth daily.    Historical Provider, MD   BP 173/70 mmHg  Pulse 70  Temp(Src) 97.8 F (36.6 C) (Oral)  Resp 16  Ht 5' (1.524 m)  Wt 121 lb (54.885 kg)  BMI 23.63 kg/m2  SpO2 100% Physical Exam  Constitutional: She is oriented to person, place, and time. She appears well-developed and well-nourished.  HENT:  Head: Normocephalic and atraumatic.  Right Ear: External ear normal.  Left Ear: External ear normal.  Eyes: Conjunctivae and EOM are normal. Pupils are equal, round, and reactive to light.  Neck: Normal range of motion. Neck supple.  Cardiovascular: Normal rate, regular rhythm, normal heart sounds and intact distal pulses.   Pulmonary/Chest: Effort normal and breath sounds normal.  Abdominal: Soft. Bowel sounds are normal. There is no tenderness.  Musculoskeletal:  Left shoulder: She exhibits decreased range of motion, tenderness, bony tenderness and deformity.       Left upper arm: She exhibits tenderness, bony tenderness, swelling and deformity.  Neurological: She is alert and oriented to person, place, and time.  Skin: Skin is warm and dry.  Vitals reviewed.   ED Course  Procedures (including critical care time) DIAGNOSTIC STUDIES: Oxygen Saturation is 98% on RA, normal by my interpretation.    COORDINATION OF CARE: 9:02 PM  Discussed results of shoulder x-ray with pt. Discussed treatment plan which includes x-rays of left humerus and left elbow and CT head. Pt agreed to plan.  Labs Review Labs Reviewed  CBC WITH DIFFERENTIAL - Abnormal; Notable for the following:    RBC 3.50 (*)    Hemoglobin 11.1 (*)    HCT 32.9 (*)    All other components within normal limits  BASIC METABOLIC PANEL - Abnormal; Notable for the following:    Glucose, Bld 122 (*)    Creatinine, Ser 1.30 (*)    GFR calc non Af Amer 36 (*)    GFR calc Af Amer 42 (*)    Anion gap 16 (*)    All other components within normal limits    Imaging Review Dg Chest 2 View  06/03/2014   CLINICAL DATA:  Status post fall today with a left humerus fracture.  EXAM: CHEST  2 VIEW  COMPARISON:  PA and lateral chest 04/17/2013.  FINDINGS: The lungs are clear. Heart size is normal. No pneumothorax or pleural fluid. Acute proximal left humerus fracture is identified. Degenerative disease is seen about the acromioclavicular joints.  IMPRESSION: No acute cardiopulmonary disease.  Acute proximal left humerus fracture.   Electronically Signed   By: Drusilla Kanner M.D.   On: 06/03/2014 21:39   Ct Head Wo Contrast  06/03/2014   CLINICAL DATA:  Fall with head injury and headache. Initial encounter.  EXAM: CT HEAD WITHOUT CONTRAST  TECHNIQUE: Contiguous axial images were obtained from the base of the skull through the vertex without intravenous contrast.  COMPARISON:  None  FINDINGS: Atrophy and chronic small-vessel white matter ischemic changes identified.  No acute intracranial abnormalities are identified, including mass lesion or mass effect, hydrocephalus, extra-axial fluid collection, midline shift, hemorrhage, or acute infarction.  No acute bony abnormalities are noted.  Chronic right sphenoid sinus disease noted.  IMPRESSION: No evidence of acute intracranial abnormality.  Atrophy and chronic small-vessel white matter ischemic changes.  Chronic right sphenoid  sinusitis.   Electronically Signed   By: Laveda Abbe M.D.   On: 06/03/2014 21:52   Dg Shoulder Left  06/03/2014   CLINICAL DATA:  Status post fall today with a blow to the left shoulder. Left shoulder pain.  EXAM: LEFT SHOULDER - 2+ VIEW  COMPARISON:  None.  FINDINGS: The patient has a fracture of the proximal left humerus. The fracture is oblique in orientation extending from the lateral cortex at the surgical neck in an inferior and medial orientation through the proximal diaphysis. No other acute abnormality is identified. Acromioclavicular degenerative change is noted.  IMPRESSION: Proximal left humerus fractured as described.   Electronically Signed   By: Drusilla Kanner M.D.   On: 06/03/2014 21:08   Dg Humerus Left  06/03/2014   CLINICAL DATA:  80 year old female status post fall today with severe left shoulder pain  EXAM: LEFT HUMERUS - 2+ VIEW  COMPARISON:  Concurrently obtained radiographs of the left shoulder  FINDINGS: Comminuted proximal humerus fracture through  the surgical neck. There are at least 3 fracture fragments. Humeral head remains located. The visualized thorax remains unremarkable. There is associated soft tissue swelling.  IMPRESSION: Comminuted 3 part proximal left humeral diaphyseal fracture extending into the surgical neck.   Electronically Signed   By: Malachy MoanHeath  McCullough M.D.   On: 06/03/2014 21:39     EKG Interpretation   Date/Time:  Tuesday June 03 2014 21:41:51 EST Ventricular Rate:  72 PR Interval:  146 QRS Duration: 84 QT Interval:  308 QTC Calculation: 337 R Axis:   69 Text Interpretation:  Sinus rhythm with occasional Premature ventricular  complexes ST \\T \ T wave abnormality, consider inferior ischemia Abnormal  ECG No significant change since last tracing Confirmed by Mirian MoGentry, Genisis Sonnier  [phone]([id number]) on 06/03/2014 10:32:39 PM      MDM   Final diagnoses:  Proximal humerus fracture, left, closed, initial encounter    80 y.o. female with pertinent PMH of  HTN, gout presents with proximal humeral fracture after mechanical fall with direct blow.  Rest of imaging unremarkable. NV intact. No open injuries. Consulted orthopedics who recommended sling and fu in the office next day for potential operative repair.    1. Proximal humerus fracture, left, closed, initial encounter   2. Nicholaus BloomFall           Gerica Koble, MD 06/04/14 1147

## 2014-06-03 NOTE — ED Notes (Signed)
Patient transported to X-ray 

## 2014-06-03 NOTE — ED Notes (Signed)
Pt c/o fall with left shoulder pain x 1 hr ago

## 2014-06-04 DIAGNOSIS — S42295A Other nondisplaced fracture of upper end of left humerus, initial encounter for closed fracture: Secondary | ICD-10-CM | POA: Diagnosis not present

## 2014-06-04 DIAGNOSIS — M25512 Pain in left shoulder: Secondary | ICD-10-CM | POA: Diagnosis not present

## 2014-06-06 ENCOUNTER — Inpatient Hospital Stay (HOSPITAL_COMMUNITY)
Admission: EM | Admit: 2014-06-06 | Discharge: 2014-06-09 | DRG: 690 | Disposition: A | Discharge status: Skilled Nursing Facility | Payer: Medicare Other | Attending: Internal Medicine | Admitting: Internal Medicine

## 2014-06-06 ENCOUNTER — Encounter (HOSPITAL_COMMUNITY): Payer: Self-pay | Admitting: *Deleted

## 2014-06-06 DIAGNOSIS — D649 Anemia, unspecified: Secondary | ICD-10-CM | POA: Diagnosis present

## 2014-06-06 DIAGNOSIS — I10 Essential (primary) hypertension: Secondary | ICD-10-CM | POA: Diagnosis present

## 2014-06-06 DIAGNOSIS — B999 Unspecified infectious disease: Secondary | ICD-10-CM | POA: Diagnosis not present

## 2014-06-06 DIAGNOSIS — N3281 Overactive bladder: Secondary | ICD-10-CM | POA: Diagnosis present

## 2014-06-06 DIAGNOSIS — M109 Gout, unspecified: Secondary | ICD-10-CM | POA: Diagnosis present

## 2014-06-06 DIAGNOSIS — R296 Repeated falls: Secondary | ICD-10-CM | POA: Diagnosis present

## 2014-06-06 DIAGNOSIS — Z791 Long term (current) use of non-steroidal anti-inflammatories (NSAID): Secondary | ICD-10-CM | POA: Diagnosis not present

## 2014-06-06 DIAGNOSIS — Z96641 Presence of right artificial hip joint: Secondary | ICD-10-CM | POA: Diagnosis present

## 2014-06-06 DIAGNOSIS — K922 Gastrointestinal hemorrhage, unspecified: Secondary | ICD-10-CM | POA: Diagnosis not present

## 2014-06-06 DIAGNOSIS — Z880 Allergy status to penicillin: Secondary | ICD-10-CM

## 2014-06-06 DIAGNOSIS — F329 Major depressive disorder, single episode, unspecified: Secondary | ICD-10-CM | POA: Diagnosis present

## 2014-06-06 DIAGNOSIS — N390 Urinary tract infection, site not specified: Principal | ICD-10-CM | POA: Insufficient documentation

## 2014-06-06 DIAGNOSIS — S42292D Other displaced fracture of upper end of left humerus, subsequent encounter for fracture with routine healing: Secondary | ICD-10-CM | POA: Diagnosis not present

## 2014-06-06 DIAGNOSIS — E039 Hypothyroidism, unspecified: Secondary | ICD-10-CM | POA: Diagnosis not present

## 2014-06-06 DIAGNOSIS — Z87891 Personal history of nicotine dependence: Secondary | ICD-10-CM | POA: Diagnosis not present

## 2014-06-06 DIAGNOSIS — E079 Disorder of thyroid, unspecified: Secondary | ICD-10-CM | POA: Diagnosis present

## 2014-06-06 DIAGNOSIS — W1830XD Fall on same level, unspecified, subsequent encounter: Secondary | ICD-10-CM | POA: Diagnosis not present

## 2014-06-06 DIAGNOSIS — R531 Weakness: Secondary | ICD-10-CM | POA: Diagnosis not present

## 2014-06-06 DIAGNOSIS — S42302A Unspecified fracture of shaft of humerus, left arm, initial encounter for closed fracture: Secondary | ICD-10-CM | POA: Diagnosis not present

## 2014-06-06 DIAGNOSIS — F32A Depression, unspecified: Secondary | ICD-10-CM | POA: Diagnosis present

## 2014-06-06 DIAGNOSIS — R488 Other symbolic dysfunctions: Secondary | ICD-10-CM | POA: Diagnosis not present

## 2014-06-06 DIAGNOSIS — B9620 Unspecified Escherichia coli [E. coli] as the cause of diseases classified elsewhere: Secondary | ICD-10-CM | POA: Diagnosis not present

## 2014-06-06 DIAGNOSIS — E785 Hyperlipidemia, unspecified: Secondary | ICD-10-CM | POA: Diagnosis present

## 2014-06-06 DIAGNOSIS — R195 Other fecal abnormalities: Secondary | ICD-10-CM | POA: Diagnosis not present

## 2014-06-06 DIAGNOSIS — M6281 Muscle weakness (generalized): Secondary | ICD-10-CM | POA: Diagnosis not present

## 2014-06-06 DIAGNOSIS — R262 Difficulty in walking, not elsewhere classified: Secondary | ICD-10-CM | POA: Diagnosis not present

## 2014-06-06 DIAGNOSIS — K2971 Gastritis, unspecified, with bleeding: Secondary | ICD-10-CM | POA: Diagnosis not present

## 2014-06-06 HISTORY — DX: Personal history of other diseases of the musculoskeletal system and connective tissue: Z87.39

## 2014-06-06 HISTORY — DX: Unspecified osteoarthritis, unspecified site: M19.90

## 2014-06-06 HISTORY — DX: Migraine, unspecified, not intractable, without status migrainosus: G43.909

## 2014-06-06 HISTORY — DX: Pure hypercholesterolemia, unspecified: E78.00

## 2014-06-06 HISTORY — DX: Hypothyroidism, unspecified: E03.9

## 2014-06-06 HISTORY — DX: Personal history of other medical treatment: Z92.89

## 2014-06-06 LAB — URINALYSIS, ROUTINE W REFLEX MICROSCOPIC
Bilirubin Urine: NEGATIVE
GLUCOSE, UA: NEGATIVE mg/dL
KETONES UR: NEGATIVE mg/dL
Nitrite: POSITIVE — AB
Protein, ur: 30 mg/dL — AB
Specific Gravity, Urine: 1.016 (ref 1.005–1.030)
Urobilinogen, UA: 0.2 mg/dL (ref 0.0–1.0)
pH: 5.5 (ref 5.0–8.0)

## 2014-06-06 LAB — RETICULOCYTES
RBC.: 3.02 MIL/uL — AB (ref 3.87–5.11)
RETIC COUNT ABSOLUTE: 54.4 10*3/uL (ref 19.0–186.0)
Retic Ct Pct: 1.8 % (ref 0.4–3.1)

## 2014-06-06 LAB — VITAMIN B12: Vitamin B-12: 897 pg/mL (ref 211–911)

## 2014-06-06 LAB — CBC WITH DIFFERENTIAL/PLATELET
BASOS ABS: 0 10*3/uL (ref 0.0–0.1)
BASOS PCT: 0 % (ref 0–1)
Eosinophils Absolute: 0.1 10*3/uL (ref 0.0–0.7)
Eosinophils Relative: 1 % (ref 0–5)
HEMATOCRIT: 30 % — AB (ref 36.0–46.0)
Hemoglobin: 9.8 g/dL — ABNORMAL LOW (ref 12.0–15.0)
LYMPHS PCT: 15 % (ref 12–46)
Lymphs Abs: 1.5 10*3/uL (ref 0.7–4.0)
MCH: 30.5 pg (ref 26.0–34.0)
MCHC: 32.7 g/dL (ref 30.0–36.0)
MCV: 93.5 fL (ref 78.0–100.0)
MONO ABS: 0.8 10*3/uL (ref 0.1–1.0)
Monocytes Relative: 8 % (ref 3–12)
NEUTROS ABS: 7.8 10*3/uL — AB (ref 1.7–7.7)
Neutrophils Relative %: 76 % (ref 43–77)
Platelets: 237 10*3/uL (ref 150–400)
RBC: 3.21 MIL/uL — ABNORMAL LOW (ref 3.87–5.11)
RDW: 13.3 % (ref 11.5–15.5)
WBC: 10.2 10*3/uL (ref 4.0–10.5)

## 2014-06-06 LAB — COMPREHENSIVE METABOLIC PANEL
ALT: 13 U/L (ref 0–35)
AST: 32 U/L (ref 0–37)
Albumin: 3.9 g/dL (ref 3.5–5.2)
Alkaline Phosphatase: 63 U/L (ref 39–117)
Anion gap: 14 (ref 5–15)
BILIRUBIN TOTAL: 0.6 mg/dL (ref 0.3–1.2)
BUN: 19 mg/dL (ref 6–23)
CHLORIDE: 98 meq/L (ref 96–112)
CO2: 27 meq/L (ref 19–32)
CREATININE: 1.13 mg/dL — AB (ref 0.50–1.10)
Calcium: 10 mg/dL (ref 8.4–10.5)
GFR calc Af Amer: 50 mL/min — ABNORMAL LOW (ref >90)
GFR calc non Af Amer: 43 mL/min — ABNORMAL LOW (ref >90)
Glucose, Bld: 108 mg/dL — ABNORMAL HIGH (ref 70–99)
POTASSIUM: 4.2 meq/L (ref 3.7–5.3)
Sodium: 139 mEq/L (ref 137–147)
Total Protein: 7.4 g/dL (ref 6.0–8.3)

## 2014-06-06 LAB — URINE MICROSCOPIC-ADD ON

## 2014-06-06 LAB — FOLATE

## 2014-06-06 LAB — POC OCCULT BLOOD, ED: Fecal Occult Bld: POSITIVE — AB

## 2014-06-06 LAB — IRON AND TIBC
IRON: 23 ug/dL — AB (ref 42–135)
Saturation Ratios: 8 % — ABNORMAL LOW (ref 20–55)
TIBC: 279 ug/dL (ref 250–470)
UIBC: 256 ug/dL (ref 125–400)

## 2014-06-06 LAB — FERRITIN: Ferritin: 108 ng/mL (ref 10–291)

## 2014-06-06 MED ORDER — SIMVASTATIN 40 MG PO TABS
40.0000 mg | ORAL_TABLET | Freq: Every day | ORAL | Status: DC
  Administered 2014-06-06 – 2014-06-08 (×3): 40 mg via ORAL
  Filled 2014-06-06 (×4): qty 1

## 2014-06-06 MED ORDER — PANTOPRAZOLE SODIUM 40 MG IV SOLR
40.0000 mg | INTRAVENOUS | Status: DC
  Administered 2014-06-06 – 2014-06-09 (×4): 40 mg via INTRAVENOUS
  Filled 2014-06-06 (×3): qty 40

## 2014-06-06 MED ORDER — ACETAMINOPHEN 650 MG RE SUPP: 650.0000 mg | Freq: Four times a day (QID) | RECTAL | Status: DC | PRN

## 2014-06-06 MED ORDER — CEFTRIAXONE SODIUM IN DEXTROSE 20 MG/ML IV SOLN
1.0000 g | INTRAVENOUS | Status: DC
  Administered 2014-06-07 – 2014-06-08 (×2): 1 g via INTRAVENOUS
  Filled 2014-06-06 (×3): qty 50

## 2014-06-06 MED ORDER — CITALOPRAM HYDROBROMIDE 20 MG PO TABS
20.0000 mg | ORAL_TABLET | Freq: Every day | ORAL | Status: DC
  Administered 2014-06-07 – 2014-06-09 (×3): 20 mg via ORAL
  Filled 2014-06-06 (×3): qty 1

## 2014-06-06 MED ORDER — MORPHINE SULFATE 2 MG/ML IJ SOLN: 2.0000 mg | INTRAMUSCULAR | Status: DC | PRN

## 2014-06-06 MED ORDER — LEVOTHYROXINE SODIUM 50 MCG PO TABS
50.0000 ug | ORAL_TABLET | Freq: Every day | ORAL | Status: DC
  Administered 2014-06-07 – 2014-06-09 (×3): 50 ug via ORAL
  Filled 2014-06-06 (×5): qty 1

## 2014-06-06 MED ORDER — DEXTROSE 5 % IV SOLN
1.0000 g | Freq: Once | INTRAVENOUS | Status: AC
  Administered 2014-06-06: 1 g via INTRAVENOUS

## 2014-06-06 MED ORDER — ACETAMINOPHEN 325 MG PO TABS
650.0000 mg | ORAL_TABLET | Freq: Four times a day (QID) | ORAL | Status: DC | PRN
  Administered 2014-06-07 – 2014-06-09 (×5): 650 mg via ORAL
  Filled 2014-06-06 (×4): qty 2

## 2014-06-06 MED ORDER — DARIFENACIN HYDROBROMIDE ER 7.5 MG PO TB24
7.5000 mg | ORAL_TABLET | Freq: Every day | ORAL | Status: DC
  Administered 2014-06-07 – 2014-06-09 (×3): 7.5 mg via ORAL
  Filled 2014-06-06 (×3): qty 1

## 2014-06-06 NOTE — Consult Note (Signed)
Subjective:   HPI  The patient is an 80 year old female who was admitted to the hospital because of weakness and falling. She recently fell and sustained a left humeral fracture. She is currently in a sling.  We are asked to see her in regards to heme positive stool. The patient and her husband report that she has had dark stools for months. When I asked him specifically about this and they said that the stool  was a dark brown not black. She denies rectal bleeding. She denies abdominal pain, vomiting, or hematemesis. She takes Aleve every day for gout and has done so over the past year. She had a colonoscopy 10 years ago by Dr. Danise EdgeMartin Johnson with findings of diverticulosis and colon polyps. I asked her specifically if she had another colonoscopy 2 years ago but she states no. Review of Systems No chest pain or shortness of breath  Past Medical History  Diagnosis Date  . Hypertension   . Thyroid disease   . Gout    Past Surgical History  Procedure Laterality Date  . Tubal ligation    . Joint replacement     History   Social History  . Marital Status: Married    Spouse Name: N/A    Number of Children: N/A  . Years of Education: N/A   Occupational History  . Not on file.   Social History Main Topics  . Smoking status: Never Smoker   . Smokeless tobacco: Not on file  . Alcohol Use: No  . Drug Use: No  . Sexual Activity: Not on file   Other Topics Concern  . Not on file   Social History Narrative   family history is not on file. Current facility-administered medications: acetaminophen (TYLENOL) tablet 650 mg, 650 mg, Oral, Q6H PRN **OR** acetaminophen (TYLENOL) suppository 650 mg, 650 mg, Rectal, Q6H PRN, Maryann Mikhail, DO;  [START ON 06/07/2014] cefTRIAXone (ROCEPHIN) 1 g in dextrose 5 % 50 mL IVPB - Premix, 1 g, Intravenous, Q24H, Maryann Mikhail, DO;  [START ON 06/07/2014] citalopram (CELEXA) tablet 20 mg, 20 mg, Oral, Daily, Maryann Mikhail, DO [START ON 06/07/2014]  darifenacin (ENABLEX) 24 hr tablet 7.5 mg, 7.5 mg, Oral, Daily, Maryann Mikhail, DO;  [START ON 06/07/2014] levothyroxine (SYNTHROID, LEVOTHROID) tablet 50 mcg, 50 mcg, Oral, QAC breakfast, Maryann Mikhail, DO;  morphine 2 MG/ML injection 2 mg, 2 mg, Intravenous, Q4H PRN, Maryann Mikhail, DO;  pantoprazole (PROTONIX) injection 40 mg, 40 mg, Intravenous, Q24H, Maryann Mikhail, DO, 40 mg at 06/06/14 1406 simvastatin (ZOCOR) tablet 40 mg, 40 mg, Oral, q1800, Maryann Mikhail, DO Allergies  Allergen Reactions  . Penicillins Swelling    Lips      Objective:     BP 130/55 mmHg  Pulse 76  Temp(Src) 98.5 F (36.9 C) (Oral)  Resp 20  Ht 5' (1.524 m)  Wt 61.916 kg (136 lb 8 oz)  BMI 26.66 kg/m2  SpO2 96%  She is in no distress  Heart regular rhythm no murmurs  Lungs clear  Left arm is in a sling  Abdomen: Bowel sounds normal, soft, nontender  Hemoglobin and hematocrit are 9.8 and 30. One year ago hemoglobin and hematocrit were 10.5 and 31.4.  Laboratory No components found for: D1    Assessment:     Heme positive stool.      Plan:     I think that this patient's heme positive stool could be explained by her daily use of Aleve. She did not describe true melena to  me. Provided that her hemoglobin and hematocrit remained relatively stable which they have appeared to be over the past year I would elect not to put this patient through an EGD or colonoscopy. The risk of sedation and procedures in this elderly patient seem to outweigh the benefits in my opinion just for diagnostic purposes. Since she continues to take Aleve I would recommend that she be on a prophylactic PPI to protect her gastric mucosa such as pantoprazole 40 mg daily.

## 2014-06-06 NOTE — Progress Notes (Signed)
PT Cancellation Note  Patient Details Name: Diamond [name] MRN: [id number] DOB: 02-10-1927   Cancelled Treatment:    Reason Eval/Treat Not Completed: Other (comment) (Pt declined) Pt had just finished with OT and c/o back pain from sitting up in the chair, therefore declining PT eval at this time. Will reattempt evaluation as able.   SPC left in pt room for use with next PT per OT recommendation.    Karolee StampsGray, April [facility]on [facility] 06/06/2014, 3:55 PM

## 2014-06-06 NOTE — ED Provider Notes (Signed)
CSN: [id number]     Arrival date & time 06/06/14  1016 History   First MD Initiated Contact with Patient 06/06/14 1034     Chief Complaint  Patient presents with  . Fall  . Weakness     (Consider location/radiation/quality/duration/timing/severity/associated sxs/prior Treatment) HPI Comments: Patient presents to the ER for evaluation of progressive worsening of generalized weakness. Patient reports that she was seen in the ER 4 days ago after a fall. She was diagnosed with a proximal humerus fracture, has been wearing a sling for this. Since the fall, she has had progressive worsening of her generalized weakness. This morning she was unable to get out of bed and husband was unable to get her to stand because of her weakness. She has not had any fever, nausea, vomiting, diarrhea. Patient denies chest pain and shortness of breath.  Patient is a 80 y.o. female presenting with fall and weakness.  Fall Pertinent negatives include no chest pain and no shortness of breath.  Weakness Pertinent negatives include no chest pain and no shortness of breath.    Past Medical History  Diagnosis Date  . Hypertension   . Hypothyroidism   . High cholesterol   . History of gout   . History of blood transfusion     "related to my hip OR"  . Migraine     "q 5-6 years" (06/06/2014)  . Arthritis     "everywhere"   Past Surgical History  Procedure Laterality Date  . Tubal ligation    . Joint replacement    . Cholecystectomy  1970's  . Total hip arthroplasty Right 1980's  . Fracture surgery    . Ankle fracture surgery Right 1988    "crushed it"  . Dilation and curettage of uterus     History reviewed. No pertinent family history. History  Substance Use Topics  . Smoking status: Former Smoker -- 2.00 packs/day for 40 years    Types: Cigarettes    Quit date: 05/05/1987  . Smokeless tobacco: Never Used  . Alcohol Use: Yes     Comment: "stopped drinking in 2000"   OB History    No data  available     Review of Systems  Constitutional: Positive for fatigue.  Respiratory: Negative for shortness of breath.   Cardiovascular: Negative for chest pain.  Musculoskeletal: Positive for arthralgias.  Neurological: Positive for weakness.  All other systems reviewed and are negative.     Allergies  Penicillins  Home Medications   Prior to Admission medications   Medication Sig Start Date End Date Taking? Authorizing Provider  citalopram (CELEXA) 20 MG tablet Take 20 mg by mouth daily.   Yes Historical Provider, MD  levothyroxine (SYNTHROID, LEVOTHROID) 50 MCG tablet Take 50 mcg by mouth daily before breakfast.   Yes Historical Provider, MD  oxyCODONE-acetaminophen (PERCOCET/ROXICET) 5-325 MG per tablet Take 2 tablets by mouth every 4 (four) hours as needed for severe pain. 06/03/14  Yes Mirian Mo, MD  simvastatin (ZOCOR) 40 MG tablet Take 40 mg by mouth daily.   Yes Historical Provider, MD  solifenacin (VESICARE) 5 MG tablet Take 5 mg by mouth daily.   Yes Historical Provider, MD   BP 136/61 mmHg  Pulse 80  Temp(Src) 98.2 F (36.8 C) (Oral)  Resp 16  Ht 5' (1.524 m)  Wt 136 lb 8 oz (61.916 kg)  BMI 26.66 kg/m2  SpO2 98% Physical Exam  Constitutional: She is oriented to person, place, and time. She appears well-developed and well-nourished.  No distress.  HENT:  Head: Normocephalic and atraumatic.  Right Ear: Hearing normal.  Left Ear: Hearing normal.  Nose: Nose normal.  Mouth/Throat: Oropharynx is clear and moist and mucous membranes are normal.  Eyes: Conjunctivae and EOM are normal. Pupils are equal, round, and reactive to light.  Neck: Normal range of motion. Neck supple.  Cardiovascular: Regular rhythm, S1 normal and S2 normal.  Exam reveals no gallop and no friction rub.   No murmur heard. Pulmonary/Chest: Effort normal and breath sounds normal. No respiratory distress. She exhibits no tenderness.  Abdominal: Soft. Normal appearance and bowel sounds are  normal. There is no hepatosplenomegaly. There is no tenderness. There is no rebound, no guarding, no tenderness at McBurney's point and negative Murphy's sign. No hernia.  Musculoskeletal:       Right shoulder: She exhibits decreased range of motion and bony tenderness.  Neurological: She is alert and oriented to person, place, and time. She has normal strength. No cranial nerve deficit or sensory deficit. Coordination normal. GCS eye subscore is 4. GCS verbal subscore is 5. GCS motor subscore is 6.  Skin: Skin is warm, dry and intact. No rash noted. No cyanosis.  Psychiatric: She has a normal mood and affect. Her speech is normal and behavior is normal. Thought content normal.  Nursing note and vitals reviewed.   ED Course  Procedures (including critical care time) Labs Review Labs Reviewed  CBC WITH DIFFERENTIAL - Abnormal; Notable for the following:    RBC 3.21 (*)    Hemoglobin 9.8 (*)    HCT 30.0 (*)    Neutro Abs 7.8 (*)    All other components within normal limits  COMPREHENSIVE METABOLIC PANEL - Abnormal; Notable for the following:    Glucose, Bld 108 (*)    Creatinine, Ser 1.13 (*)    GFR calc non Af Amer 43 (*)    GFR calc Af Amer 50 (*)    All other components within normal limits  URINALYSIS, ROUTINE W REFLEX MICROSCOPIC - Abnormal; Notable for the following:    APPearance TURBID (*)    Hgb urine dipstick SMALL (*)    Protein, ur 30 (*)    Nitrite POSITIVE (*)    Leukocytes, UA LARGE (*)    All other components within normal limits  URINE MICROSCOPIC-ADD ON - Abnormal; Notable for the following:    Squamous Epithelial / LPF FEW (*)    Bacteria, UA MANY (*)    All other components within normal limits  IRON AND TIBC - Abnormal; Notable for the following:    Iron 23 (*)    Saturation Ratios 8 (*)    All other components within normal limits  RETICULOCYTES - Abnormal; Notable for the following:    RBC. 3.02 (*)    All other components within normal limits  BASIC  METABOLIC PANEL - Abnormal; Notable for the following:    Glucose, Bld 103 (*)    Creatinine, Ser 1.17 (*)    GFR calc non Af Amer 41 (*)    GFR calc Af Amer 48 (*)    All other components within normal limits  CBC - Abnormal; Notable for the following:    RBC 3.05 (*)    Hemoglobin 9.6 (*)    HCT 28.7 (*)    All other components within normal limits  POC OCCULT BLOOD, ED - Abnormal; Notable for the following:    Fecal Occult Bld POSITIVE (*)    All other components within normal limits  URINE CULTURE  VITAMIN B12  FOLATE  FERRITIN  TSH    Imaging Review No results found.   EKG Interpretation None      MDM   Final diagnoses:  Generalized weakness  Frequent falls  UTI (lower urinary tract infection)  Gastrointestinal hemorrhage, unspecified gastritis, unspecified gastrointestinal hemorrhage type    Patient presents to the ER for evaluation of generalized weakness. Patient has had a fall 4 days ago secondary to weakness, resulting in proximal humerus fracture. Since then she has progressively worsened. She was unable to get out of bed this morning. Workup reveals evidence of urinary tract infection. She also has anemia below her baseline with fecal occult positive stools. Vital signs, however, are normal. She will require hospitalization for further management of UTI and workup for anemia.    Gilda Creasehristopher J. Pollina, MD 06/07/14 1003

## 2014-06-06 NOTE — ED Notes (Signed)
Attempted report 

## 2014-06-06 NOTE — H&P (Signed)
[facility] History and Physical  Diamond RedDelores A [name] ZOX:[id number] DOB: 18-May-1927 DOA: 06/06/2014  Referring physician: Dr. Jaci Carrelhristopher Pollina PCP: Dr.[facility] Specialists:   Chief Complaint: Weakness and fall  HPI: Diamond [name] is a 80 y.o. female  With a history hypertension, hypothyroidism presented to the emergency department with complaints of generalized weakness and fall. Patient was recently seen on 06/03/2014 after sustaining a fall and found to have a left humeral fracture. She was placed in a sling and sent home at that time. Patient then presented on day of admission with complaints of recurrent falls.  Patient's husband is unable to take care of her at home due to her continuous falls.  EMS has been called several times to assist with lifting the patient off of the floor after sliding out of bed.  She denies dizziness, chest pain, shortness of breath, abdominal pain, nausea, vomiting, diarrhea, constipation.  She does state that over the past several months she has had several dark stools.  She admits to having a colonoscopy 2 years ago and it was normal.  In the ER, patient was found to have a positive fecal occult, a UTI.  [facility] was called for admission.  Review of Systems:  Constitutional: Denies fever, chills, diaphoresis, appetite change and fatigue.  HEENT: Denies photophobia, eye pain, redness, hearing loss, ear pain, congestion, sore throat, rhinorrhea, sneezing, mouth sores, trouble swallowing, neck pain, neck stiffness and tinnitus.   Respiratory: Denies SOB, DOE, cough, chest tightness,  and wheezing.   Cardiovascular: Denies chest pain, palpitations and leg swelling.  Gastrointestinal: Has had dark stools over the past few months Genitourinary: Denies dysuria, urgency, frequency, hematuria, flank pain and difficulty urinating.  Musculoskeletal: Denies myalgias, back pain, joint swelling, arthralgias and gait problem.  Skin: Denies pallor, rash and wound.    Neurological: Complains of generalized weakness.  Hematological: Denies adenopathy. Easy bruising, personal or family bleeding history  Psychiatric/Behavioral: Denies suicidal ideation, mood changes, confusion, nervousness, sleep disturbance and agitation  Past Medical History  Diagnosis Date  . Hypertension   . Thyroid disease   . Gout    Past Surgical History  Procedure Laterality Date  . Tubal ligation    . Joint replacement     Social History:  reports that she has never smoked. She does not have any smokeless tobacco history on file. She reports that she does not drink alcohol or use illicit drugs.  Allergies  Allergen Reactions  . Penicillins Swelling    Lips     History reviewed. No pertinent family history.   Prior to Admission medications   Medication Sig Start Date End Date Taking? Authorizing Provider  citalopram (CELEXA) 20 MG tablet Take 20 mg by mouth daily.   Yes Historical Provider, MD  levothyroxine (SYNTHROID, LEVOTHROID) 50 MCG tablet Take 50 mcg by mouth daily before breakfast.   Yes Historical Provider, MD  oxyCODONE-acetaminophen (PERCOCET/ROXICET) 5-325 MG per tablet Take 2 tablets by mouth every 4 (four) hours as needed for severe pain. 06/03/14  Yes Mirian MoMatthew Gentry, MD  simvastatin (ZOCOR) 40 MG tablet Take 40 mg by mouth daily.   Yes Historical Provider, MD  solifenacin (VESICARE) 5 MG tablet Take 5 mg by mouth daily.   Yes Historical Provider, MD   Physical Exam: Filed Vitals:   06/06/14 1300  BP: 110/70  Pulse: 81  Temp:   Resp:      General: Well developed, well nourished, NAD, appears stated age  HEENT: NCAT, PERRLA, EOMI, Anicteic Sclera, mucous membranes  moist.   Neck: Supple, no JVD, no masses  Cardiovascular: S1 S2 auscultated, no rubs, murmurs or gallops. Regular rate and rhythm.  Respiratory: Clear to auscultation bilaterally with equal chest rise  Abdomen: Soft, nontender, nondistended, + bowel sounds  Extremities: warm dry  without cyanosis clubbing or edema. LUE in sling  Neuro: AAOx3, cranial nerves grossly intact. Strength equal and bilateral in upper/lower ext, limited testing in LUE due to recent fracture and pain  Skin: Without rashes exudates or nodules  Psych: Normal affect and demeanor with intact judgement and insight  Labs on Admission:  Basic Metabolic Panel:  Recent Labs Lab 06/03/14 2143 06/06/14 1128  NA 138 139  K 3.9 4.2  CL 97 98  CO2 25 27  GLUCOSE 122* 108*  BUN 23 19  CREATININE 1.30* 1.13*  CALCIUM 9.7 10.0   Liver Function Tests:  Recent Labs Lab 06/06/14 1128  AST 32  ALT 13  ALKPHOS 63  BILITOT 0.6  PROT 7.4  ALBUMIN 3.9   No results for input(s): LIPASE, AMYLASE in the last 168 hours. No results for input(s): AMMONIA in the last 168 hours. CBC:  Recent Labs Lab 06/03/14 2143 06/06/14 1128  WBC 10.4 10.2  NEUTROABS 6.6 7.8*  HGB 11.1* 9.8*  HCT 32.9* 30.0*  MCV 94.0 93.5  PLT 269 237   Cardiac Enzymes: No results for input(s): CKTOTAL, CKMB, CKMBINDEX, TROPONINI in the last 168 hours.  BNP (last 3 results) No results for input(s): PROBNP in the last 8760 hours. CBG: No results for input(s): GLUCAP in the last 168 hours.  Radiological Exams on Admission: No results found.  EKG: None  Assessment/Plan  UTI  -Patient will be admitted to medical floor. -UA showed many bacteria, TNTC WBC, large leukocytes, positive nitrites -Urine culture currently pending -Continue ceftriaxone  Generalized weakness with frequent falls -Possibly secondary to urinary tract infection versus anemia -Will check TSH, vitamin B-12, folate -Will consult PT and OT for evaluation and treatment  ?GI Bleed/ Melanotic Stools/normocytic anemia -Patient states this has been ongoing for the last several months -Patient has had colonoscopy 2 years ago and states it was normal (not in EPIC, there is a pathology report from 2005) -Consulted gastroenterology Deboraha Sprang([facility]) -Fecal  was positive -Baseline hemoglobin appears to be approximately 10 -Continue to monitor CBC -Will place patient on clear liquid diet -Will give patient Protonix IV  Left humeral fracture -Orthopedics consulted and appreciated -Continue sling  Depression -Continue Celexa  Overactive bladder -Continue present care  Hypothyroidism -Continue Synthroid  DVT prophylaxis: SCDs  Code Status: Full  Condition: Guarded  Family Communication: Family at bedside. Admission, patients condition and plan of care including tests being ordered have been discussed with the patient and family who indicate understanding and agree with the plan and Code Status.  Disposition Plan: Admitted   Time spent: 60 minutes  Kayde Warehime D.O. [facility] Pager [phone]  If 7PM-7AM, please contact night-coverage [email or web address] Password [email or web address] 06/06/2014, 1:43 PM

## 2014-06-06 NOTE — ED Notes (Signed)
Hospitalist at the bedside 

## 2014-06-06 NOTE — Plan of Care (Signed)
Problem: Phase I Progression Outcomes Goal: Pain controlled with appropriate interventions Outcome: Completed/Met Date Met:  06/06/14 Goal: OOB as tolerated unless otherwise ordered Outcome: Completed/Met Date Met:  06/06/14

## 2014-06-06 NOTE — Progress Notes (Signed)
ANTIBIOTIC CONSULT NOTE - INITIAL  Pharmacy Consult for Rocephin Indication: UTI  Allergies  Allergen Reactions  . Penicillins Swelling    Lips     Patient Measurements: Height: 5' (152.4 cm) Weight: 136 lb 8 oz (61.916 kg) IBW/kg (Calculated) : 45.5  Vital Signs: Temp: 98.5 F (36.9 C) (12/04 1439) Temp Source: Oral (12/04 1439) BP: 130/55 mmHg (12/04 1439) Pulse Rate: 76 (12/04 1439) Intake/Output from previous day:   Intake/Output from this shift: Total I/O In: 50 [I.V.:50] Out: -   Labs:  Recent Labs  06/03/14 2143 06/06/14 1128  WBC 10.4 10.2  HGB 11.1* 9.8*  PLT 269 237  CREATININE 1.30* 1.13*   Estimated Creatinine Clearance: 29.9 mL/min (by C-G formula based on Cr of 1.13). No results for input(s): VANCOTROUGH, VANCOPEAK, VANCORANDOM, GENTTROUGH, GENTPEAK, GENTRANDOM, TOBRATROUGH, TOBRAPEAK, TOBRARND, AMIKACINPEAK, AMIKACINTROU, AMIKACIN in the last 72 hours.   Microbiology: No results found for this or any previous visit (from the past 720 hour(s)).  Medical History: Past Medical History  Diagnosis Date  . Hypertension   . Thyroid disease   . Gout     Medications:  Anti-infectives    Start     Dose/Rate Route Frequency Ordered Stop   06/06/14 1315  cefTRIAXone (ROCEPHIN) 1 g in dextrose 5 % 50 mL IVPB     1 g100 mL/hr over 30 Minutes Intravenous  Once 06/06/14 1306 06/06/14 1355     Assessment: 80 year old female admitted with generalized weakness and fall with dirty UA to start Rocephin for UTI. WBC wnl. Afebrile. SCr 1.30 >>1.13. Estimated CrCl ~30 mL/min. One time dose was given at 1300 today. Urine culture pending.   Note patient has history of lip swelling with penicillins. Spoke with Dr. Catha GosselinMikhail- patient tolerated first dose and MD wants to continue with monitoring.   Goal of Therapy:  Clinical resolution of infection  Plan:  1. Rocephin 1g IV q24h. 2. Monitor for any signs and symptoms of allergic reaction.   Link SnufferJessica Shanera Meske,  PharmD, BCPS Clinical Pharmacist [phone] 06/06/2014,3:18 PM

## 2014-06-06 NOTE — ED Notes (Signed)
Pt and family member reports having fall 4 days ago and injured left arm/shhoulder. Having increase in fatigue and unsteady gait, difficulty ambulating since last night. A&o at triage.

## 2014-06-06 NOTE — ED Notes (Signed)
Phlebotomy at the bedside  

## 2014-06-06 NOTE — Evaluation (Signed)
Occupational Therapy Evaluation Patient Details Name: Diamond [name] A Schembri MRN: [id number] DOB: 12-05-1926 Today's Date: 06/06/2014    History of Present Illness This 80 yo admitted with weakness and falls. Found to have a UTI, occult blood in stool, and anemia. Patient was recently seen on 06/03/2014 after sustaining a fall and found to have a left humeral fracture (sling and NWB'ing--no surgery per family). She was placed in a sling and sent home at that time. Patient then presented on day of admission with complaints of recurrent falls.  Patient's husband is unable to take care of her at home due to her continuous falls.   Clinical Impression   This 80 yo female admitted with above presents to acute OT with increased pain, decreased balance, decreased mobility, decreased use of LUE, fear of falling, and decreased cognition all affecting her ability to care for herself. She will benefit from acute OT with follow up at SNF.    Follow Up Recommendations  SNF    Equipment Recommendations   (TBD at next venue)       Precautions / Restrictions Precautions Precautions: Fall Required Braces or Orthoses: Sling (LUE at all times) Restrictions Weight Bearing Restrictions: Yes LUE Weight Bearing: Non weight bearing      Mobility Bed Mobility Overal bed mobility: Needs Assistance Bed Mobility: Supine to Sit     Supine to sit: Mod assist     General bed mobility comments: with min guard A to use RUE to scoot forward to EOB with increased time and VCs  Transfers Overall transfer level: Needs assistance Equipment used: 1 person hand held assist Transfers: Sit to/from Stand Sit to Stand: Mod assist              Balance Overall balance assessment: Needs assistance Sitting-balance support: Single extremity supported;Feet supported Sitting balance-Leahy Scale: Poor     Standing balance support: Single extremity supported Standing balance-Leahy Scale: Poor                              ADL Overall ADL's : Needs assistance/impaired Eating/Feeding: Set up;Supervision/ safety;Sitting   Grooming: Moderate assistance;Sitting   Upper Body Bathing: Moderate assistance;Sitting   Lower Body Bathing: Moderate assistance (with Mod A sit<>stand)   Upper Body Dressing : Total assistance;Sitting   Lower Body Dressing: Total assistance (with Mod A sit<>stand)   Toilet Transfer: Moderate assistance;Ambulation (one person HHA with gait belt)   Toileting- Clothing Manipulation and Hygiene: Total assistance (with Mod A sit<>stand)         General ADL Comments: Pt quite fearful of falling               Pertinent Vitals/Pain Pain Assessment: Faces Pain Score: 5  Pain Location: back Pain Descriptors / Indicators: Aching Pain Intervention(s): Monitored during session;Repositioned     Hand Dominance  right   Extremity/Trunk Assessment Upper Extremity Assessment Upper Extremity Assessment: LUE deficits/detail LUE Deficits / Details: 3 part humeral fx--non-operative, sling at all times LUE Coordination: decreased fine motor;decreased gross motor              Cognition Arousal/Alertness: Awake/alert Behavior During Therapy: WFL for tasks assessed/performed Overall Cognitive Status: Impaired/Different from baseline Area of Impairment: Orientation Orientation Level: Disoriented to;Time;Situation   Memory: Decreased recall of precautions (for LUE)                        Home Living Family/patient expects to  be discharged to:: Skilled nursing facility                                        Prior Functioning/Environment Level of Independence: Independent (until 3 days ago when she fell and broke her LUE)             OT Diagnosis: Generalized weakness;Cognitive deficits;Acute pain   OT Problem List: Decreased strength;Decreased range of motion;Decreased activity tolerance;Impaired balance (sitting and/or  standing);Pain;Impaired UE functional use;Decreased coordination;Decreased cognition;Decreased knowledge of use of DME or AE;Decreased knowledge of precautions   OT Treatment/Interventions: Self-care/ADL training;Patient/family education;Balance training;DME and/or AE instruction;Cognitive remediation/compensation;Therapeutic activities    OT Goals(Current goals can be found in the care plan section) Acute Rehab OT Goals Patient Stated Goal: to not fall again OT Goal Formulation: With patient Time For Goal Achievement: 06/13/14 Potential to Achieve Goals: Good  OT Frequency: Min 2X/week   Barriers to D/C: Decreased caregiver support             End of Session Equipment Utilized During Treatment: Gait belt  Activity Tolerance: Patient limited by fatigue;Patient limited by pain Patient left: in bed;with call bell/phone within reach;with bed alarm set;with family/visitor present   Time: [id number] OT Time Calculation (min): 35 min Charges:  OT General Charges $OT Visit: 1 Procedure OT Evaluation $Initial OT Evaluation Tier I: 1 Procedure OT Treatments $Self Care/Home Management : 23-37 mins  Evette GeorgesLeonard, Reinhart Saulters Eva [phone] 06/06/2014, 4:12 PM

## 2014-06-06 NOTE — ED Notes (Signed)
New sling applied. One on pt was too small.

## 2014-06-06 NOTE — Progress Notes (Signed)
Report called to ED  

## 2014-06-07 LAB — BASIC METABOLIC PANEL
Anion gap: 14 (ref 5–15)
BUN: 16 mg/dL (ref 6–23)
CHLORIDE: 100 meq/L (ref 96–112)
CO2: 26 meq/L (ref 19–32)
Calcium: 9.7 mg/dL (ref 8.4–10.5)
Creatinine, Ser: 1.17 mg/dL — ABNORMAL HIGH (ref 0.50–1.10)
GFR calc Af Amer: 48 mL/min — ABNORMAL LOW (ref >90)
GFR calc non Af Amer: 41 mL/min — ABNORMAL LOW (ref >90)
GLUCOSE: 103 mg/dL — AB (ref 70–99)
POTASSIUM: 3.9 meq/L (ref 3.7–5.3)
Sodium: 140 mEq/L (ref 137–147)

## 2014-06-07 LAB — CBC
HCT: 28.7 % — ABNORMAL LOW (ref 36.0–46.0)
HEMOGLOBIN: 9.6 g/dL — AB (ref 12.0–15.0)
MCH: 31.5 pg (ref 26.0–34.0)
MCHC: 33.4 g/dL (ref 30.0–36.0)
MCV: 94.1 fL (ref 78.0–100.0)
Platelets: 247 10*3/uL (ref 150–400)
RBC: 3.05 MIL/uL — AB (ref 3.87–5.11)
RDW: 13.4 % (ref 11.5–15.5)
WBC: 7.7 10*3/uL (ref 4.0–10.5)

## 2014-06-07 LAB — TSH: TSH: 3.89 u[IU]/mL (ref 0.350–4.500)

## 2014-06-07 NOTE — Plan of Care (Signed)
Problem: Phase II Progression Outcomes Goal: IV changed to normal saline lock Outcome: Completed/Met Date Met:  06/07/14     

## 2014-06-07 NOTE — Plan of Care (Signed)
Problem: Consults Goal: General Medical Patient Education See Patient Education Module for specific education.  Outcome: Completed/Met Date Met:  06/07/14 Goal: Skin Care Protocol Initiated - if Braden Score 18 or less If consults are not indicated, leave blank or document N/A  Outcome: Completed/Met Date Met:  06/07/14 Goal: Nutrition Consult-if indicated Outcome: Completed/Met Date Met:  06/07/14 Goal: Diabetes Guidelines if Diabetic/Glucose > 140 If diabetic or lab glucose is > 140 mg/dl - Initiate Diabetes/Hyperglycemia Guidelines & Document Interventions  Outcome: Not Applicable Date Met:  43/83/81  Problem: Phase I Progression Outcomes Goal: Initial discharge plan identified Outcome: Progressing Goal: Hemodynamically stable Outcome: Progressing Goal: Other Phase I Outcomes/Goals Outcome: Progressing  Problem: Phase II Progression Outcomes Goal: Progress activity as tolerated unless otherwise ordered Outcome: Progressing Goal: Discharge plan established Outcome: Progressing Goal: Vital signs remain stable Outcome: Progressing Goal: Obtain order to discontinue catheter if appropriate Outcome: Progressing Goal: Other Phase II Outcomes/Goals Outcome: Progressing  Problem: Phase III Progression Outcomes Goal: Pain controlled on oral analgesia Outcome: Progressing Goal: Activity at appropriate level-compared to baseline (UP IN CHAIR FOR HEMODIALYSIS)  Outcome: Progressing Goal: Voiding independently Outcome: Progressing Goal: IV/normal saline lock discontinued Outcome: Progressing Goal: Foley discontinued Outcome: Progressing Goal: Discharge plan remains appropriate-arrangements made Outcome: Progressing Goal: Other Phase III Outcomes/Goals Outcome: Progressing  Problem: Discharge Progression Outcomes Goal: Discharge plan in place and appropriate Outcome: Progressing Goal: Pain controlled with appropriate interventions Outcome: Progressing Goal: Hemodynamically  stable Outcome: Progressing Goal: Complications resolved/controlled Outcome: Progressing Goal: Tolerating diet Outcome: Progressing Goal: Activity appropriate for discharge plan Outcome: Progressing Goal: Other Discharge Outcomes/Goals Outcome: Progressing

## 2014-06-07 NOTE — Plan of Care (Signed)
Problem: Phase I Progression Outcomes Goal: Voiding-avoid urinary catheter unless indicated Outcome: Completed/Met Date Met:  06/07/14

## 2014-06-07 NOTE — Evaluation (Signed)
Physical Therapy Evaluation Patient Details Name: Diamond [name] A Jerger MRN: [id number] DOB: 28-Dec-1926 Today's Date: 06/07/2014   History of Present Illness  This 80 yo admitted with weakness and falls. Found to have a UTI, occult blood in stool, and anemia. Patient was recently seen on 06/03/2014 after sustaining a fall and found to have a left humeral fracture (sling and NWB'ing--no surgery per family). She was placed in a sling and sent home at that time. Patient then presented on day of admission with complaints of recurrent falls.  Patient's husband is unable to take care of her at home due to her continuous falls.  Clinical Impression  Pt was seen for evaluation of her physical function after recent history of multiple falls and difficulty managing safety at home.  Pt has reduced LE strength and ROM in ankles, hips that are factoring into her ability to catch her balance.  Her current functional level is to need SNF care to recover deficits and reduce fall risk.    Follow Up Recommendations SNF;Supervision/Assistance - 24 hour    Equipment Recommendations  Other (comment) (hemiwalker)    Recommendations for Other Services       Precautions / Restrictions Precautions Precautions: Fall Required Braces or Orthoses: Sling (new L humeral fracture) Restrictions Weight Bearing Restrictions: Yes LUE Weight Bearing: Non weight bearing (L arm)      Mobility  Bed Mobility Overal bed mobility: Needs Assistance Bed Mobility: Supine to Sit     Supine to sit: Mod assist     General bed mobility comments: Pt just tends to reach for PT's hand rather than initiate her sequence to help herself  Transfers Overall transfer level: Needs assistance Equipment used: 1 person hand held assist Transfers: Sit to/from [employer] CorporationStand;Stand Pivot Transfers Sit to Stand: Min assist Stand pivot transfers: Min assist       General transfer comment: used a SPC and reminded pt mult times how to manage  cane  Ambulation/Gait Ambulation/Gait assistance: Min assist;Min guard Ambulation Distance (Feet): 200 Feet Assistive device: Straight cane;1 person hand held assist Gait Pattern/deviations: Step-to pattern;Shuffle;Decreased step length - right;Decreased step length - left;Decreased dorsiflexion - right;Decreased dorsiflexion - left;Drifts right/left;Narrow base of support;Ataxic Gait velocity: slow then fast Gait velocity interpretation: at or above normal speed for age/gender General Gait Details: Pt is too unsafe at a faster speed and has to stop at times to control it.  Not sure what she is doing herself, struggling to control  Stairs            Wheelchair Mobility    Modified Rankin (Stroke Patients Only)       Balance Overall balance assessment: Needs assistance (with supervision not to take off before she is ready) Sitting-balance support: Feet supported Sitting balance-Leahy Scale: Fair     Standing balance support: Single extremity supported Standing balance-Leahy Scale: Poor Standing balance comment: Pt is somewhat confined in sling and does not see this as an impediment                             Pertinent Vitals/Pain Pain Assessment: No/denies pain Pain Score: 0-No pain    Home Living Family/patient expects to be discharged to:: Private residence Living Arrangements: Children Available Help at Discharge: Family Type of Home: House Home Access: Stairs to enter Entrance Stairs-Rails: None Entrance Stairs-Number of Steps: 2 Home Layout: One level Home Equipment: Shower seat      Prior Function Level of Independence:  Independent               Hand Dominance        Extremity/Trunk Assessment   Upper Extremity Assessment: LUE deficits/detail       LUE Deficits / Details: 3 part humeral fx--non-operative, sling at all times   Lower Extremity Assessment: Generalized weakness      Cervical / Trunk Assessment: Normal   Communication   Communication: No difficulties  Cognition Arousal/Alertness: Awake/alert Behavior During Therapy: WFL for tasks assessed/performed Overall Cognitive Status: Impaired/Different from baseline Area of Impairment: Safety/judgement;Problem solving     Memory: Decreased recall of precautions   Safety/Judgement: Decreased awareness of safety;Decreased awareness of deficits   Problem Solving: Slow processing;Requires verbal cues;Requires tactile cues General Comments: reminders for sequencing sit to [facility]    General Comments General comments (skin integrity, edema, etc.): Pt in sling on LUE and cannot stop herself from drifiting into obstacles, needed cues to avoid them    Exercises        Assessment/Plan    PT Assessment Patient needs continued PT services  PT Diagnosis Abnormality of gait   PT Problem List Decreased strength;Decreased range of motion;Decreased activity tolerance;Decreased balance;Decreased mobility;Decreased coordination;Decreased cognition;Decreased knowledge of use of DME;Decreased safety awareness  PT Treatment Interventions DME instruction;Gait training;Stair training;Functional mobility training;Therapeutic activities;Therapeutic exercise;Balance training;Neuromuscular re-education;Cognitive remediation;Patient/family education   PT Goals (Current goals can be found in the Care Plan section) Acute Rehab PT Goals Patient Stated Goal: to not fall again PT Goal Formulation: With patient/family Time For Goal Achievement: 06/20/14 Potential to Achieve Goals: Good    Frequency Min 3X/week   Barriers to discharge Inaccessible home environment;Other (comment) (needs 24/7 sup with injured L UE from recent fall)      Co-evaluation               End of Session Equipment Utilized During Treatment: Gait belt Activity Tolerance: Patient tolerated treatment well;Patient limited by fatigue Patient left: in chair;with call bell/phone  within reach;with family/visitor present;with chair alarm set Nurse Communication: Mobility status         Time: 1201-1229 PT Time Calculation (min) (ACUTE ONLY): 28 min   Charges:   PT Evaluation $Initial PT Evaluation Tier I: 1 Procedure PT Treatments $Gait Training: 8-22 mins  Samul Dadauth Chaquana Nichols, PT MS Acute Rehab Dept. Number: [phone]   PT G Codes:          Ivar DrapeStout, Lashonne Shull E 06/07/2014, 1:31 PM

## 2014-06-07 NOTE — Progress Notes (Signed)
No signs of active bleeding. Hemoglobin and hematocrit stable. Patient feels well.  As per my note yesterday if hemoglobin and hematocrit remain stable I would not pursue endoscopic evaluation. We will sign off. Call us if needed.

## 2014-06-07 NOTE — Progress Notes (Signed)
[facility] PROGRESS NOTE  Diamond [name] ZOX:[id number] DOB: 01-28-1927 DOA: 06/06/2014 PCP: [name],[name], MD  Assessment/Plan: #1 urinary tract infection Urine cultures are pending. Continue empiric Rocephin.  #2?? Melanotic stools/normocytic anemia Unknown etiology. Patient had FOBT which was positive by her hemoglobin has remained stable. Patient with no overt GI bleed. Patient hemodynamically stable. Patient has been seen in consultation by gastroenterology, who feel that if patient's hemoglobin remained stable no further endoscopic evaluation is needed at this time. Follow H&H. Continue PPI.  #3 generalized weakness and frequent falls Likely secondary to urinary tract infection and debility. Urine cultures are pending. TSH is within normal limits at 3.890. PT/OT pending.  #4 left humeral fracture Continue current sling. Follow-up with orthopedics.  #5 depression Continue home regimen of Celexa.  #6 hyperlipidemia Continue Zocor.   #7 overactive bladder Continue Enablex.  #8 prophylaxis PPI for GI prophylaxis. SCDs for DVT prophylaxis.   Code Status: Full Family Communication: Updated patient, no family at bedside. Disposition Plan: Back to skilled nursing facility when medically stable.   Consultants:  Gastroenterology: Dr. [name] 06/06/2014  Procedures:  None  Antibiotics:  IV Rocephin 06/06/2014  HPI/Subjective: Patient with no complaints. Patient denies any melanotic stools.  Objective: Filed Vitals:   06/07/14 0520  BP: 136/61  Pulse: 80  Temp: 98.2 F (36.8 C)  Resp: 16    Intake/Output Summary (Last 24 hours) at 06/07/14 0910 Last data filed at 06/06/14 2015  Gross per 24 hour  Intake     50 ml  Output    100 ml  Net    -50 ml   Filed Weights   06/06/14 1032 06/06/14 1439  Weight: 55.339 kg (122 lb) 61.916 kg (136 lb 8 oz)    Exam:   General:  NAD  Cardiovascular: RRR  Respiratory: CTAB  Abdomen: Soft, nontender,  nondistended, positive bowel sounds.  Musculoskeletal: No clubbing cyanosis or edema. Left upper extremity in a sling.  Data Reviewed: Basic Metabolic Panel:  Recent Labs Lab 06/03/14 2143 06/06/14 1128 06/07/14 0631  NA 138 139 140  K 3.9 4.2 3.9  CL 97 98 100  CO2 25 27 26   GLUCOSE 122* 108* 103*  BUN 23 19 16   CREATININE 1.30* 1.13* 1.17*  CALCIUM 9.7 10.0 9.7   Liver Function Tests:  Recent Labs Lab 06/06/14 1128  AST 32  ALT 13  ALKPHOS 63  BILITOT 0.6  PROT 7.4  ALBUMIN 3.9   No results for input(s): LIPASE, AMYLASE in the last 168 hours. No results for input(s): AMMONIA in the last 168 hours. CBC:  Recent Labs Lab 06/03/14 2143 06/06/14 1128 06/07/14 0631  WBC 10.4 10.2 7.7  NEUTROABS 6.6 7.8*  --   HGB 11.1* 9.8* 9.6*  HCT 32.9* 30.0* 28.7*  MCV 94.0 93.5 94.1  PLT 269 237 247   Cardiac Enzymes: No results for input(s): CKTOTAL, CKMB, CKMBINDEX, TROPONINI in the last 168 hours. BNP (last 3 results) No results for input(s): PROBNP in the last 8760 hours. CBG: No results for input(s): GLUCAP in the last 168 hours.  No results found for this or any previous visit (from the past 240 hour(s)).   Studies: No results found.  Scheduled Meds: . cefTRIAXone (ROCEPHIN)  IV  1 g Intravenous Q24H  . citalopram  20 mg Oral Daily  . darifenacin  7.5 mg Oral Daily  . levothyroxine  50 mcg Oral QAC breakfast  . pantoprazole (PROTONIX) IV  40 mg Intravenous Q24H  . simvastatin  40 mg Oral q1800   Continuous Infusions:   Principal Problem:   UTI (lower urinary tract infection) Active Problems:   Anemia   Generalized weakness   Left humeral fracture   Depression   Hypothyroidism   Overactive bladder   Normocytic anemia    Time spent: 40 MINS    [facility] Hospital[facility]OMPSON,[name] [facility] Pager [phone]. If 7PM-7AM, please contact night-coverage at [email or web address], password [email or web address] 06/07/2014, 9:10 AM  LOS: 1 day

## 2014-06-08 DIAGNOSIS — B9620 Unspecified Escherichia coli [E. coli] as the cause of diseases classified elsewhere: Secondary | ICD-10-CM

## 2014-06-08 LAB — URINE CULTURE: Colony Count: >=100000

## 2014-06-08 LAB — CBC
HEMATOCRIT: 28 % — AB (ref 36.0–46.0)
HEMOGLOBIN: 9.2 g/dL — AB (ref 12.0–15.0)
MCH: 30.3 pg (ref 26.0–34.0)
MCHC: 32.9 g/dL (ref 30.0–36.0)
MCV: 92.1 fL (ref 78.0–100.0)
Platelets: 281 10*3/uL (ref 150–400)
RBC: 3.04 MIL/uL — ABNORMAL LOW (ref 3.87–5.11)
RDW: 13.4 % (ref 11.5–15.5)
WBC: 7.7 10*3/uL (ref 4.0–10.5)

## 2014-06-08 LAB — BASIC METABOLIC PANEL
Anion gap: 16 — ABNORMAL HIGH (ref 5–15)
BUN: 19 mg/dL (ref 6–23)
CHLORIDE: 100 meq/L (ref 96–112)
CO2: 25 mEq/L (ref 19–32)
CREATININE: 1.18 mg/dL — AB (ref 0.50–1.10)
Calcium: 9 mg/dL (ref 8.4–10.5)
GFR calc non Af Amer: 41 mL/min — ABNORMAL LOW (ref >90)
GFR, EST AFRICAN AMERICAN: 47 mL/min — AB (ref >90)
GLUCOSE: 104 mg/dL — AB (ref 70–99)
POTASSIUM: 3.7 meq/L (ref 3.7–5.3)
Sodium: 141 mEq/L (ref 137–147)

## 2014-06-08 MED ORDER — IRON DEXTRAN 50 MG/ML IJ SOLN
25.0000 mg | Freq: Once | INTRAMUSCULAR | Status: AC
  Administered 2014-06-08: 25 mg via INTRAVENOUS
  Filled 2014-06-08: qty 0.5

## 2014-06-08 MED ORDER — POLYSACCHARIDE IRON COMPLEX 150 MG PO CAPS
150.0000 mg | ORAL_CAPSULE | Freq: Every day | ORAL | Status: DC
  Administered 2014-06-08 – 2014-06-09 (×2): 150 mg via ORAL
  Filled 2014-06-08 (×2): qty 1

## 2014-06-08 MED ORDER — SODIUM CHLORIDE 0.9 % IV SOLN
1000.0000 mg | Freq: Once | INTRAVENOUS | Status: AC
  Administered 2014-06-08: 1000 mg via INTRAVENOUS
  Filled 2014-06-08 (×2): qty 20

## 2014-06-08 NOTE — Clinical Social Work Psychosocial (Signed)
Clinical Social Work Department BRIEF PSYCHOSOCIAL ASSESSMENT 06/08/2014  Patient:  Diamond [name], Diamond [name]     Account Number:  [id number]     Admit date:  06/06/2014  Clinical Social Worker:  Hubert Azure  Date/Time:  06/08/2014 05:40 PM  Referred by:  Physician  Date Referred:  06/08/2014 Referred for  SNF Placement   Other Referral:   Interview type:  Patient Other interview type:   Patient's husband Diamond [name] and daughters were present at bedside.    PSYCHOSOCIAL DATA Living Status:  HUSBAND Admitted from facility:   Level of care:   Primary support name:  Diamond [name] [phone]) Primary support relationship to patient:  SPOUSE Degree of support available:   Good    CURRENT CONCERNS Current Concerns  Post-Acute Placement   Other Concerns:    SOCIAL WORK ASSESSMENT / PLAN CSW met with patient who was alert and oriented. Patient's husband was present at bedside and daughters were present in the room. CSW introduced self and explained role. CSW explained SNF placement process and discussed d/c plan with patient. Per patient, she does not use any equipment at home and prefers [facility] as she has friends there and the facility is close to her home. CSW encouraged patient to have at least 2 additional facilities. Patient states she only wants to go to [facility], but is agreeable to [facility] search in the event [place] does not have a bed available.   Assessment/plan status:  Other - See comment Other assessment/ plan:   CSW to complete FL2 and submit PASARR for SNF placement.   Information/referral to community resources:   CSW provided patient and husband with community SNF list.    PATIENT'S/FAMILY'S RESPONSE TO PLAN OF CARE: Patient and husband were cooperative and pleasant. Patient states she needs the rehab, but wants to go to a facility she has heard good things about from friends "[facility]." Patient understands she can  receive quality rehab in a "quality place," that is not [facility] if she has to go somewhere else.    [place], [place] Weekend Clinical Social Worker [phone]

## 2014-06-08 NOTE — Progress Notes (Signed)
[facility] PROGRESS NOTE  Diamond [name] ZOX:[id number] DOB: 1927/05/13 DOA: 06/06/2014 PCP: [name],[name], MD  Assessment/Plan: #1 E Coli urinary tract infection Urine cultures c/w E Coli. Sensitivities are pending. Continue empiric Rocephin.  #2?? Melanotic stools/normocytic anemia Unknown etiology. Patient had FOBT which was positive by her hemoglobin has remained stable. Patient with no overt GI bleed. Patient hemodynamically stable. Patient has been seen in consultation by gastroenterology, who feel that if patient's hemoglobin remained stable no further endoscopic evaluation is needed at this time. Follow H&H. Continue PPI.  #3 generalized weakness and frequent falls Likely secondary to urinary tract infection and debility. Urine cultures with E Coli, sensitivites pending. TSH is within normal limits at 3.890. PT/OT pending.  #4 left humeral fracture Continue current sling. Follow-up with orthopedics.  #5 depression Continue home regimen of Celexa.  #6 hyperlipidemia Continue Zocor.   #7 overactive bladder Continue Enablex.  #8 prophylaxis PPI for GI prophylaxis. SCDs for DVT prophylaxis.   Code Status: Full Family Communication: Updated patient, no family at bedside. Disposition Plan: Back to skilled nursing facility when medically stable.   Consultants:  Gastroenterology: Dr. [name] 06/06/2014  Procedures:  None  Antibiotics:  IV Rocephin 06/06/2014  HPI/Subjective: Patient with no complaints. Patient denies any melanotic stools. Patient states she feels better.  Objective: Filed Vitals:   06/08/14 0546  BP: 126/47  Pulse: 70  Temp: 97.4 F (36.3 C)  Resp: 18    Intake/Output Summary (Last 24 hours) at 06/08/14 0832 Last data filed at 06/07/14 1327  Gross per 24 hour  Intake    290 ml  Output      0 ml  Net    290 ml   Filed Weights   06/06/14 1032 06/06/14 1439  Weight: 55.339 kg (122 lb) 61.916 kg (136 lb 8 oz)     Exam:   General:  NAD  Cardiovascular: RRR  Respiratory: CTAB  Abdomen: Soft, nontender, nondistended, positive bowel sounds.  Musculoskeletal: No clubbing cyanosis or edema. Left upper extremity in a sling.  Data Reviewed: Basic Metabolic Panel:  Recent Labs Lab 06/03/14 2143 06/06/14 1128 06/07/14 0631  NA 138 139 140  K 3.9 4.2 3.9  CL 97 98 100  CO2 25 27 26   GLUCOSE 122* 108* 103*  BUN 23 19 16   CREATININE 1.30* 1.13* 1.17*  CALCIUM 9.7 10.0 9.7   Liver Function Tests:  Recent Labs Lab 06/06/14 1128  AST 32  ALT 13  ALKPHOS 63  BILITOT 0.6  PROT 7.4  ALBUMIN 3.9   No results for input(s): LIPASE, AMYLASE in the last 168 hours. No results for input(s): AMMONIA in the last 168 hours. CBC:  Recent Labs Lab 06/03/14 2143 06/06/14 1128 06/07/14 0631 06/08/14 0614  WBC 10.4 10.2 7.7 7.7  NEUTROABS 6.6 7.8*  --   --   HGB 11.1* 9.8* 9.6* 9.2*  HCT 32.9* 30.0* 28.7* 28.0*  MCV 94.0 93.5 94.1 92.1  PLT 269 237 247 281   Cardiac Enzymes: No results for input(s): CKTOTAL, CKMB, CKMBINDEX, TROPONINI in the last 168 hours. BNP (last 3 results) No results for input(s): PROBNP in the last 8760 hours. CBG: No results for input(s): GLUCAP in the last 168 hours.  Recent Results (from the past 240 hour(s))  Urine culture     Status: None (Preliminary result)   Collection Time: 06/06/14  1:19 PM  Result Value Ref Range Status   Specimen Description URINE, CLEAN CATCH  Final   Special Requests NONE  Final   Culture  Setup Time   Final    06/06/2014 13:35 Performed at [facility]    Colony Count   Final    >=100,000 COLONIES/ML Performed at [facility]    Culture   Final    ESCHERICHIA COLI Performed at [facility]    Report Status PENDING  Incomplete     Studies: No results found.  Scheduled Meds: . cefTRIAXone (ROCEPHIN)  IV  1 g Intravenous Q24H  . citalopram  20 mg Oral Daily  . darifenacin  7.5 mg Oral  Daily  . iron polysaccharides  150 mg Oral Daily  . levothyroxine  50 mcg Oral QAC breakfast  . pantoprazole (PROTONIX) IV  40 mg Intravenous Q24H  . simvastatin  40 mg Oral q1800   Continuous Infusions:   Principal Problem:   E. coli UTI Active Problems:   Anemia   Generalized weakness   Left humeral fracture   Depression   Hypothyroidism   Overactive bladder   Normocytic anemia    Time spent: 40 MINS    [facility] CenterHOMPSON,Anaise Sterbenz MD [facility] Pager [phone]. If 7PM-7AM, please contact night-coverage at [email or web address], password [email or web address] 06/08/2014, 8:32 AM  LOS: 2 days

## 2014-06-08 NOTE — Plan of Care (Signed)
Problem: Phase I Progression Outcomes Goal: Initial discharge plan identified Outcome: Completed/Met Date Met:  06/08/14 Goal: Hemodynamically stable Outcome: Completed/Met Date Met:  06/08/14 Goal: Other Phase I Outcomes/Goals Outcome: Not Applicable Date Met:  15/52/08  Problem: Phase II Progression Outcomes Goal: Progress activity as tolerated unless otherwise ordered Outcome: Completed/Met Date Met:  06/08/14 Goal: Discharge plan established Outcome: Progressing Goal: Vital signs remain stable Outcome: Completed/Met Date Met:  06/08/14 Goal: Obtain order to discontinue catheter if appropriate Outcome: Not Applicable Date Met:  08/26/34 Goal: Other Phase II Outcomes/Goals Outcome: Not Applicable Date Met:  06/26/48  Problem: Phase III Progression Outcomes Goal: Voiding independently Outcome: Completed/Met Date Met:  06/08/14 Goal: Foley discontinued Outcome: Not Applicable Date Met:  75/30/05  Problem: Discharge Progression Outcomes Goal: Tolerating diet Outcome: Completed/Met Date Met:  06/08/14

## 2014-06-08 NOTE — Progress Notes (Signed)
UR Completed.  [phone]  

## 2014-06-08 NOTE — Progress Notes (Signed)
MEDICATION RELATED CONSULT NOTE - INITIAL   Pharmacy Consult for IV Iron Indication: Iron replacement  Allergies  Allergen Reactions  . Codeine Swelling    Lips  . Penicillins Swelling    Lips     Patient Measurements: Height: 5' (152.4 cm) Weight: 136 lb 8 oz (61.916 kg) IBW/kg (Calculated) : 45.5  Vital Signs: Temp: 97.4 F (36.3 C) (12/06 0546) Temp Source: Oral (12/06 0546) BP: 126/47 mmHg (12/06 0546) Pulse Rate: 70 (12/06 0546) Intake/Output from previous day: 12/05 0701 - 12/06 0700 In: 290 [P.O.:240; IV Piggyback:50] Out: -  Intake/Output from this shift:    Labs:  Recent Labs  06/06/14 1128 06/07/14 0631 06/08/14 0614  WBC 10.2 7.7 7.7  HGB 9.8* 9.6* 9.2*  HCT 30.0* 28.7* 28.0*  PLT 237 247 281  CREATININE 1.13* 1.17* 1.18*  ALBUMIN 3.9  --   --   PROT 7.4  --   --   AST 32  --   --   ALT 13  --   --   ALKPHOS 63  --   --   BILITOT 0.6  --   --    Estimated Creatinine Clearance: 28.7 mL/min (by C-G formula based on Cr of 1.18).   Microbiology: Recent Results (from the past 720 hour(s))  Urine culture     Status: None (Preliminary result)   Collection Time: 06/06/14  1:19 PM  Result Value Ref Range Status   Specimen Description URINE, CLEAN CATCH  Final   Special Requests NONE  Final   Culture  Setup Time   Final    06/06/2014 13:35 Performed at [facility]    Colony Count   Final    >=100,000 COLONIES/ML Performed at [facility]    Culture   Final    ESCHERICHIA COLI Performed at [facility]    Report Status PENDING  Incomplete    Medical History: Past Medical History  Diagnosis Date  . Hypertension   . Hypothyroidism   . High cholesterol   . History of gout   . History of blood transfusion     "related to my hip OR"  . Migraine     "q 5-6 years" (06/06/2014)  . Arthritis     "everywhere"    Assessment: 80 year old female with melanotic stools and normocytic anemia (FOBT +) to receive IV iron  replacement with low iron stores.   Hgb 9.2 (down from 9.6).  Iron -23, TIBC 279, Sat ratios 8, Ferritin 108.  Goal of Therapy:  Hgb 14-15  Plan:  Iron dextran 25mg  test dose x1 with monitoring for 1 hr. If no reaction at 1 hr, give Iron dextran 1000mg  x1.   Link SnufferJessica Karolina Zamor, PharmD, BCPS Clinical Pharmacist [phone] 06/08/2014,9:20 AM

## 2014-06-08 NOTE — Progress Notes (Signed)
Pt was able to tolerate iv iron well. No c/o of pain. Pt is comfortable at the moment.

## 2014-06-09 DIAGNOSIS — E039 Hypothyroidism, unspecified: Secondary | ICD-10-CM | POA: Diagnosis not present

## 2014-06-09 DIAGNOSIS — S42302A Unspecified fracture of shaft of humerus, left arm, initial encounter for closed fracture: Secondary | ICD-10-CM | POA: Diagnosis not present

## 2014-06-09 DIAGNOSIS — F329 Major depressive disorder, single episode, unspecified: Secondary | ICD-10-CM | POA: Diagnosis not present

## 2014-06-09 DIAGNOSIS — R296 Repeated falls: Secondary | ICD-10-CM | POA: Diagnosis not present

## 2014-06-09 DIAGNOSIS — M6281 Muscle weakness (generalized): Secondary | ICD-10-CM | POA: Diagnosis not present

## 2014-06-09 DIAGNOSIS — S42292D Other displaced fracture of upper end of left humerus, subsequent encounter for fracture with routine healing: Secondary | ICD-10-CM | POA: Diagnosis not present

## 2014-06-09 DIAGNOSIS — R262 Difficulty in walking, not elsewhere classified: Secondary | ICD-10-CM | POA: Diagnosis not present

## 2014-06-09 DIAGNOSIS — B999 Unspecified infectious disease: Secondary | ICD-10-CM | POA: Diagnosis not present

## 2014-06-09 DIAGNOSIS — N390 Urinary tract infection, site not specified: Secondary | ICD-10-CM | POA: Insufficient documentation

## 2014-06-09 DIAGNOSIS — I10 Essential (primary) hypertension: Secondary | ICD-10-CM | POA: Diagnosis not present

## 2014-06-09 DIAGNOSIS — B9620 Unspecified Escherichia coli [E. coli] as the cause of diseases classified elsewhere: Secondary | ICD-10-CM | POA: Diagnosis not present

## 2014-06-09 DIAGNOSIS — W1830XD Fall on same level, unspecified, subsequent encounter: Secondary | ICD-10-CM | POA: Diagnosis not present

## 2014-06-09 DIAGNOSIS — S42302D Unspecified fracture of shaft of humerus, left arm, subsequent encounter for fracture with routine healing: Secondary | ICD-10-CM | POA: Diagnosis not present

## 2014-06-09 DIAGNOSIS — D649 Anemia, unspecified: Secondary | ICD-10-CM | POA: Diagnosis not present

## 2014-06-09 DIAGNOSIS — R531 Weakness: Secondary | ICD-10-CM | POA: Diagnosis not present

## 2014-06-09 DIAGNOSIS — E038 Other specified hypothyroidism: Secondary | ICD-10-CM | POA: Diagnosis not present

## 2014-06-09 DIAGNOSIS — R488 Other symbolic dysfunctions: Secondary | ICD-10-CM | POA: Diagnosis not present

## 2014-06-09 DIAGNOSIS — E780 Pure hypercholesterolemia: Secondary | ICD-10-CM | POA: Diagnosis not present

## 2014-06-09 LAB — CBC
HCT: 28.5 % — ABNORMAL LOW (ref 36.0–46.0)
Hemoglobin: 9.6 g/dL — ABNORMAL LOW (ref 12.0–15.0)
MCH: 31.9 pg (ref 26.0–34.0)
MCHC: 33.7 g/dL (ref 30.0–36.0)
MCV: 94.7 fL (ref 78.0–100.0)
Platelets: 288 10*3/uL (ref 150–400)
RBC: 3.01 MIL/uL — ABNORMAL LOW (ref 3.87–5.11)
RDW: 13.4 % (ref 11.5–15.5)
WBC: 8.3 10*3/uL (ref 4.0–10.5)

## 2014-06-09 LAB — BASIC METABOLIC PANEL
Anion gap: 15 (ref 5–15)
BUN: 19 mg/dL (ref 6–23)
CALCIUM: 9 mg/dL (ref 8.4–10.5)
CO2: 24 meq/L (ref 19–32)
Chloride: 100 mEq/L (ref 96–112)
Creatinine, Ser: 1.08 mg/dL (ref 0.50–1.10)
GFR calc Af Amer: 53 mL/min — ABNORMAL LOW (ref >90)
GFR, EST NON AFRICAN AMERICAN: 45 mL/min — AB (ref >90)
Glucose, Bld: 107 mg/dL — ABNORMAL HIGH (ref 70–99)
Potassium: 3.7 mEq/L (ref 3.7–5.3)
SODIUM: 139 meq/L (ref 137–147)

## 2014-06-09 MED ORDER — OXYCODONE-ACETAMINOPHEN 5-325 MG PO TABS: 1.0000 | ORAL_TABLET | ORAL | Status: DC | PRN

## 2014-06-09 MED ORDER — CEFUROXIME AXETIL 500 MG PO TABS
500.0000 mg | ORAL_TABLET | Freq: Two times a day (BID) | ORAL | Status: DC
Start: 2014-06-09 — End: 2014-06-09
  Administered 2014-06-09: 500 mg via ORAL
  Filled 2014-06-09 (×3): qty 1

## 2014-06-09 MED ORDER — PANTOPRAZOLE SODIUM 40 MG PO TBEC: 40.0000 mg | DELAYED_RELEASE_TABLET | Freq: Every day | ORAL | Status: DC

## 2014-06-09 MED ORDER — POLYSACCHARIDE IRON COMPLEX 150 MG PO CAPS: 150.0000 mg | ORAL_CAPSULE | Freq: Every day | ORAL | Status: DC

## 2014-06-09 MED ORDER — CEFUROXIME AXETIL 500 MG PO TABS: 500.0000 mg | ORAL_TABLET | Freq: Two times a day (BID) | ORAL | Status: DC

## 2014-06-09 NOTE — Plan of Care (Signed)
Problem: Phase III Progression Outcomes Goal: Pain controlled on oral analgesia Outcome: Completed/Met Date Met:  06/09/14     

## 2014-06-09 NOTE — Plan of Care (Signed)
Problem: Phase III Progression Outcomes Goal: Discharge plan remains appropriate-arrangements made Outcome: Progressing     

## 2014-06-09 NOTE — Clinical Social Work Placement (Addendum)
Clinical Social Work Department CLINICAL SOCIAL WORK PLACEMENT NOTE 06/09/2014  Patient:  Diamond [name],Diamond [name]  Account Number:  [id number] Admit date:  06/06/2014  Clinical Social Worker:  Derenda FennelBASHIRA NIXON, CLINICAL SOCIAL WORKER  Date/time:  06/09/2014 11:11 AM  Clinical Social Work is seeking post-discharge placement for this patient at the following level of care:   SKILLED NURSING   (*CSW will update this form in Epic as items are completed)   06/09/2014  Patient/family provided with [facility] of Clinical Social Work's list of facilities offering this level of care within the geographic area requested by the patient (or if unable, by the patient's family).  06/09/2014  Patient/family informed of their freedom to choose among providers that offer the needed level of care, that participate in Medicare, Medicaid or managed care program needed by the patient, have an available bed and are willing to accept the patient.  06/09/2014  Patient/family informed of [facility]' ownership interest in [facility], as well as of the fact that they are under no obligation to receive care at this facility.  PASARR submitted to [employer] on 06/09/2014 PASARR number received on 06/09/2014  FL2 transmitted to all facilities in geographic area requested by pt/family on  06/09/2014 FL2 transmitted to all facilities within larger geographic area on   Patient informed that his/her managed care company has contracts with or will negotiate with  certain facilities, including the following:   YES     Patient/family informed of bed offers received:  06/09/2014 [facility] Physician recommends and patient chooses bed at    [facility] be [facility]  [facility] 06[facility]14  [facility](Tyrihanna Wingert [place], [place], [phone]) Patient to be transferred to facility by [facility]  (Aidan Moten Hol[place], [place]CSWA, [phone]) Patient and family notified of transfer on 06/09/14  [facility](Addalyne Vandehei [place], [place],  [phone]) Name of family member notified:  Chrissie NoaWilliam- pt spouse  [facility](Mima Cranmore [facility], [place]CSWA, [phone])  The following physician request were entered in Epic:   Additional Comments:   Derenda FennelBashira Nixon, MSW, LCSWA [phone] 06/09/2014 11:13 AM

## 2014-06-09 NOTE — Progress Notes (Signed)
[facility]

## 2014-06-09 NOTE — Plan of Care (Signed)
Problem: Phase III Progression Outcomes Goal: Activity at appropriate level-compared to baseline (UP IN CHAIR FOR HEMODIALYSIS)  Outcome: Progressing     

## 2014-06-09 NOTE — Clinical Social Work Note (Addendum)
[facility] Anticipated discharge date:12/7 Family notified: husband at bedside [facility] at 3:15  CSW signing off.  Merlyn LotJenna Holoman, LCSWA Clinical Social Worker [phone]

## 2014-06-09 NOTE — Progress Notes (Signed)
Physical Therapy Treatment Patient Details Name: Diamond [name] Engebretsen MRN: [id number] DOB: 1927-05-05 Today's Date: 06/09/2014    History of Present Illness This 80 yo admitted with weakness and falls. Found to have [name] UTI, occult blood in stool, and anemia. Patient was recently seen on 06/03/2014 after sustaining [name] fall and found to have [name] left humeral fracture (sling and NWB'ing--no surgery per family). She was placed in [name] sling and sent home at that time. Patient then presented on day of admission with complaints of recurrent falls.  Patient's husband is unable to take care of her at home due to her continuous falls.    PT Comments    Progressing this session with endurance and stability with cane.  Still high fall risk needing SNF level rehab and limited safety due to cognitive deficits as well needing cues for precautions with left arm and difficulty with direction finding back to room in hallway.   Follow Up Recommendations  SNF;Supervision/Assistance - 24 hour     Equipment Recommendations  Other (comment) (TBA at next venue)    Recommendations for Other Services       Precautions / Restrictions Precautions Precautions: Fall Required Braces or Orthoses: Sling Restrictions LUE Weight Bearing: Non weight bearing    Mobility  Bed Mobility               General bed mobility comments: up in chair  Transfers Overall transfer level: Needs assistance Equipment used: Straight cane Transfers: Sit to/from Stand Sit to Stand: Min guard            Ambulation/Gait Ambulation/Gait assistance: Min assist Ambulation Distance (Feet): 200 Feet Assistive device: Straight cane Gait Pattern/deviations: Step-through pattern;Decreased stride length;Shuffle     General Gait Details: cues for step length   Stairs            Wheelchair Mobility    Modified Rankin (Stroke Patients Only)       Balance Overall balance assessment: Needs assistance         Standing  balance support: Single extremity supported Standing balance-Leahy Scale: Poor Standing balance comment: needs external support with cane for balance/safety                    Cognition Arousal/Alertness: Awake/alert Behavior During Therapy: WFL for tasks assessed/performed Overall Cognitive Status: Impaired/Different from baseline Area of Impairment: Memory     Memory: Decreased short-term memory;Decreased recall of precautions (thought it was [name] week ago she last saw PT, was 2 days ago)       Problem Solving: Slow processing General Comments: multimodal cues for transfers with cane    Exercises Other Exercises Other Exercises: seated scapular retraction x 5 w/5 sec hold Other Exercises: squeezing rolled washcloth x 5    General Comments        Pertinent Vitals/Pain Faces Pain Scale: Hurts little more Pain Location: back and left arm with mobility Pain Intervention(s): Monitored during session    Home Living                      Prior Function            PT Goals (current goals can now be found in the care plan section) Progress towards PT goals: Progressing toward goals    Frequency  Min 3X/week    PT Plan Current plan remains appropriate    Co-evaluation             End of Session  Equipment Utilized During Treatment: Gait belt Activity Tolerance: Patient limited by fatigue Patient left: in chair;with call bell/phone within reach;with family/visitor present;with chair alarm set     Time: 1002-1033 PT Time Calculation (min) (ACUTE ONLY): 31 min  Charges:  $Gait Training: 8-22 mins $Therapeutic Activity: 8-22 mins                    G Codes:      WYNN,CYNDI 06/09/2014, 1:31 PM Sheran Lawlessyndi Wynn, PT [phone] 06/09/2014

## 2014-06-09 NOTE — Care Management Note (Signed)
    Page 1 of 1   06/09/2014     8:04:22 PM CARE MANAGEMENT NOTE 06/09/2014  Patient:  Diamond [name],Diamond [name]   Account Number:  [id number]  Date Initiated:  06/09/2014  Documentation initiated by:  Letha CapeAYLOR,Advik Weatherspoon  Subjective/Objective Assessment:   dx fall, humerus fx, uti  admit- from home.     Action/Plan:   pt eval- rec snf   Anticipated DC Date:  06/09/2014   Anticipated DC Plan:  SKILLED NURSING FACILITY  In-house referral  Clinical Social Worker         Choice offered to / List presented to:             Status of service:  Completed, signed off Medicare Important Message given?  YES (If response is "NO", the following Medicare IM given date fields will be blank) Date Medicare IM given:  06/09/2014 Medicare IM given by:  Letha CapeAYLOR,Kaleeyah Cuffie Date Additional Medicare IM given:   Additional Medicare IM given by:    Discharge Disposition:  SKILLED NURSING FACILITY  Per UR Regulation:  Reviewed for med. necessity/level of care/duration of stay  If discussed at Long Length of Stay Meetings, dates discussed:    Comments:

## 2014-06-09 NOTE — Clinical Social Work Note (Signed)
[facility].    [facility] if patient is able to DC.  CSW will continue to follow.  Merlyn LotJenna Holoman, LCSWA Clinical Social Worker [phone]

## 2014-06-09 NOTE — Progress Notes (Signed)
[facility] at bedside to receive patient.

## 2014-06-09 NOTE — Progress Notes (Signed)
Medicare Important Message given?  YES (If response is "NO", the following Medicare IM given date fields will be blank) Date Medicare IM given:  06/09/14 Medicare IM given by:  Shade Rivenbark 

## 2014-06-09 NOTE — Discharge Summary (Signed)
Physician Discharge Summary  Mylo RedDelores A Cabana ZOX:[id number] DOB: 10-27-1926 DOA: 06/06/2014  PCP: [name],[name], MD  Admit date: 06/06/2014 Discharge date: 06/09/2014  Time spent: 65 minutes  Recommendations for Outpatient Follow-up:  1. Patient be discharged to [facility] skilled nursing facility.  2. Follow-up with Dr. Lajoyce Cornersuda of orthopedics 06/19/2014 at 3:15 PM for follow-up on left humeral fracture. 3. Follow up with M.D. at the skilled nursing facility. Patient will need a CBC done in 1 week to follow-up on her hemoglobin.  Discharge Diagnoses:  Principal Problem:   E. coli UTI Active Problems:   Anemia   Generalized weakness   Left humeral fracture   Depression   Hypothyroidism   Overactive bladder   Normocytic anemia   UTI (lower urinary tract infection)   Discharge Condition: Stable and improved  Diet recommendation: Regular  Filed Weights   06/06/14 1032 06/06/14 1439  Weight: 55.339 kg (122 lb) 61.916 kg (136 lb 8 oz)    History of present illness:  Imagine A Archuletta is a 80 y.o. female  With a history hypertension, hypothyroidism presented to the emergency department with complaints of generalized weakness and fall. Patient was recently seen on 06/03/2014 after sustaining a fall and found to have a left humeral fracture. She was placed in a sling and sent home at that time. Patient then presented on day of admission with complaints of recurrent falls. Patient's husband is unable to take care of her at home due to her continuous falls. EMS has been called several times to assist with lifting the patient off of the floor after sliding out of bed. She denied dizziness, chest pain, shortness of breath, abdominal pain, nausea, vomiting, diarrhea, constipation. She did state that over the past several months she has had several dark stools. She admited to having a colonoscopy 2 years ago and it was normal. In the ER, patient was found to have a positive fecal occult,  a UTI. [facility] was called for admission.   Hospital Course:  #1 E Coli urinary tract infection On admission urinalysis done was consistent with a UTI. Patient was placed empirically on IV Rocephin. Urine cultures grew Escherichia coli which was pansensitive. Patient was transitioned to oral Ceftin and will be discharged on 5 days of oral Ceftin to complete a one-week course of antibiotic therapy.   #2?? Melanotic stools/normocytic anemia Unknown etiology. Patient had FOBT which was positive but her hemoglobin has remained stable. Patient with no overt GI bleed. Patient hemodynamically stable. Patient has been seen in consultation by gastroenterology, who felt that if patient's hemoglobin remained stable no further endoscopic evaluation is needed at this time. Patient was placed on a PPI. Patient be discharged on a PPI. Patient's hemoglobin remained stable throughout the hospitalization.  #3 generalized weakness and frequent falls Likely secondary to urinary tract infection and debility. Patient was empirically placed on IV Rocephin. Urine cultures with E Coli, which was pansensitive. Patient will be transitioned to oral Ceftin and will need 5 more days of oral Ceftin to complete a one-week course of antibiotic therapy. Patient be discharged to skilled nursing facility.   #4 left humeral fracture Patient prior to admission was recently seen after a fall on 06/03/2014 after sustaining a fall and found to have a left humeral fracture. Patient was placed in a sling. Patient was admitted and her pain was managed. Patient will follow-up with Dr. Lajoyce Cornersuda of orthopedics as outpatient on 06/19/2014.   #5 depression Continued on home regimen of Celexa.  #  6 hyperlipidemia Continued on Zocor.   #7 overactive bladder Continued on Enablex.   Procedures:  None  Consultations:  Gastroenterology: Dr. Evette Cristal 06/06/2014  Discharge Exam: Filed Vitals:   06/09/14 1449  BP: 149/88  Pulse:   Temp: 97.7 F  (36.5 C)  Resp: 15    General: NAD Cardiovascular: RRR Respiratory: CTAB  Discharge Instructions You were cared for by a hospitalist during your hospital stay. If you have any questions about your discharge medications or the care you received while you were in the hospital after you are discharged, you can call the unit and asked to speak with the hospitalist on call if the hospitalist that took care of you is not available. Once you are discharged, your primary care physician will handle any further medical issues. Please note that NO REFILLS for any discharge medications will be authorized once you are discharged, as it is imperative that you return to your primary care physician (or establish a relationship with a primary care physician if you do not have one) for your aftercare needs so that they can reassess your need for medications and monitor your lab values.  Discharge Instructions    Diet general    Complete by:  As directed      Discharge instructions    Complete by:  As directed   Follow up Dr Lajoyce Corners on 06/19/14 Follow up [facility] SN[facility]     Increase activity slowly    Complete by:  As directed   NWB LUE          Current Discharge Medication List    START taking these medications   Details  cefUROXime (CEFTIN) 500 MG tablet Take 1 tablet (500 mg total) by mouth 2 (two) times daily with a meal. Take for 5 days then stop. Qty: 10 tablet, Refills: 0    iron polysaccharides (NIFEREX) 150 MG capsule Take 1 capsule (150 mg total) by mouth daily. Qty: 30 capsule, Refills: 0    pantoprazole (PROTONIX) 40 MG tablet Take 1 tablet (40 mg total) by mouth daily. Qty: 30 tablet, Refills: 0      CONTINUE these medications which have CHANGED   Details  oxyCODONE-acetaminophen (PERCOCET/ROXICET) 5-325 MG per tablet Take 1-2 tablets by mouth every 4 (four) hours as needed for severe pain. Qty: 20 tablet, Refills: 0      CONTINUE these medications which have NOT CHANGED   Details   citalopram (CELEXA) 20 MG tablet Take 20 mg by mouth daily.    levothyroxine (SYNTHROID, LEVOTHROID) 50 MCG tablet Take 50 mcg by mouth daily before breakfast.    simvastatin (ZOCOR) 40 MG tablet Take 40 mg by mouth daily.    solifenacin (VESICARE) 5 MG tablet Take 5 mg by mouth daily.       Allergies  Allergen Reactions  . Codeine Swelling    Lips  . Penicillins Swelling    Lips swelled with PCN; tolerating Cephalosporins 05/2014   Follow-up Information    Follow up with Nadara Mustard, MD On 06/19/2014.   Specialty:  Orthopedic Surgery   Why:  Appointment with Lajoyce Corners will be on 06/19/14 at 3:15pm    Contact information:   [address] [place] [address] [phone]       Please follow up.   Why:  f/u with MD at SNF       The results of significant diagnostics from this hospitalization (including imaging, microbiology, ancillary and laboratory) are listed below for reference.    Significant  Diagnostic Studies: Dg Chest 2 View  06/03/2014   CLINICAL DATA:  Status post fall today with a left humerus fracture.  EXAM: CHEST  2 VIEW  COMPARISON:  PA and lateral chest 04/17/2013.  FINDINGS: The lungs are clear. Heart size is normal. No pneumothorax or pleural fluid. Acute proximal left humerus fracture is identified. Degenerative disease is seen about the acromioclavicular joints.  IMPRESSION: No acute cardiopulmonary disease.  Acute proximal left humerus fracture.   Electronically Signed   By: Drusilla Kannerhomas  Dalessio M.D.   On: 06/03/2014 21:39   Ct Head Wo Contrast  06/03/2014   CLINICAL DATA:  Fall with head injury and headache. Initial encounter.  EXAM: CT HEAD WITHOUT CONTRAST  TECHNIQUE: Contiguous axial images were obtained from the base of the skull through the vertex without intravenous contrast.  COMPARISON:  None  FINDINGS: Atrophy and chronic small-vessel white matter ischemic changes identified.  No acute intracranial abnormalities are identified, including mass  lesion or mass effect, hydrocephalus, extra-axial fluid collection, midline shift, hemorrhage, or acute infarction.  No acute bony abnormalities are noted.  Chronic right sphenoid sinus disease noted.  IMPRESSION: No evidence of acute intracranial abnormality.  Atrophy and chronic small-vessel white matter ischemic changes.  Chronic right sphenoid sinusitis.   Electronically Signed   By: Laveda AbbeJeff  Hu M.D.   On: 06/03/2014 21:52   Dg Shoulder Left  06/03/2014   CLINICAL DATA:  Status post fall today with a blow to the left shoulder. Left shoulder pain.  EXAM: LEFT SHOULDER - 2+ VIEW  COMPARISON:  None.  FINDINGS: The patient has a fracture of the proximal left humerus. The fracture is oblique in orientation extending from the lateral cortex at the surgical neck in an inferior and medial orientation through the proximal diaphysis. No other acute abnormality is identified. Acromioclavicular degenerative change is noted.  IMPRESSION: Proximal left humerus fractured as described.   Electronically Signed   By: Drusilla Kannerhomas  Dalessio M.D.   On: 06/03/2014 21:08   Dg Humerus Left  06/03/2014   CLINICAL DATA:  80 year old female status post fall today with severe left shoulder pain  EXAM: LEFT HUMERUS - 2+ VIEW  COMPARISON:  Concurrently obtained radiographs of the left shoulder  FINDINGS: Comminuted proximal humerus fracture through the surgical neck. There are at least 3 fracture fragments. Humeral head remains located. The visualized thorax remains unremarkable. There is associated soft tissue swelling.  IMPRESSION: Comminuted 3 part proximal left humeral diaphyseal fracture extending into the surgical neck.   Electronically Signed   By: Malachy MoanHeath  McCullough M.D.   On: 06/03/2014 21:39    Microbiology: Recent Results (from the past 240 hour(s))  Urine culture     Status: None   Collection Time: 06/06/14  1:19 PM  Result Value Ref Range Status   Specimen Description URINE, CLEAN CATCH  Final   Special Requests NONE   Final   Culture  Setup Time   Final    06/06/2014 13:35 Performed at [facility]    Colony Count   Final    >=100,000 COLONIES/ML Performed at [facility]    Culture   Final    ESCHERICHIA COLI Performed at [facility]    Report Status 06/08/2014 FINAL  Final   Organism ID, Bacteria ESCHERICHIA COLI  Final      Susceptibility   Escherichia coli - MIC*    AMPICILLIN 4 SENSITIVE Sensitive     CEFAZOLIN <=4 SENSITIVE Sensitive     CEFTRIAXONE <=1 SENSITIVE  Sensitive     CIPROFLOXACIN <=0.25 SENSITIVE Sensitive     GENTAMICIN <=1 SENSITIVE Sensitive     LEVOFLOXACIN <=0.12 SENSITIVE Sensitive     NITROFURANTOIN <=16 SENSITIVE Sensitive     TOBRAMYCIN <=1 SENSITIVE Sensitive     TRIMETH/SULFA <=20 SENSITIVE Sensitive     PIP/TAZO <=4 SENSITIVE Sensitive     * ESCHERICHIA COLI     Labs: Basic Metabolic Panel:  Recent Labs Lab 06/03/14 2143 06/06/14 1128 06/07/14 0631 06/08/14 0614 06/09/14 0600  NA 138 139 140 141 139  K 3.9 4.2 3.9 3.7 3.7  CL 97 98 100 100 100  CO2 25 27 26 25 24   GLUCOSE 122* 108* 103* 104* 107*  BUN 23 19 16 19 19   CREATININE 1.30* 1.13* 1.17* 1.18* 1.08  CALCIUM 9.7 10.0 9.7 9.0 9.0   Liver Function Tests:  Recent Labs Lab 06/06/14 1128  AST 32  ALT 13  ALKPHOS 63  BILITOT 0.6  PROT 7.4  ALBUMIN 3.9   No results for input(s): LIPASE, AMYLASE in the last 168 hours. No results for input(s): AMMONIA in the last 168 hours. CBC:  Recent Labs Lab 06/03/14 2143 06/06/14 1128 06/07/14 0631 06/08/14 0614 06/09/14 0600  WBC 10.4 10.2 7.7 7.7 8.3  NEUTROABS 6.6 7.8*  --   --   --   HGB 11.1* 9.8* 9.6* 9.2* 9.6*  HCT 32.9* 30.0* 28.7* 28.0* 28.5*  MCV 94.0 93.5 94.1 92.1 94.7  PLT 269 237 247 281 288   Cardiac Enzymes: No results for input(s): CKTOTAL, CKMB, CKMBINDEX, TROPONINI in the last 168 hours. BNP: BNP (last 3 results) No results for input(s): PROBNP in the last 8760 hours. CBG: No results for  input(s): GLUCAP in the last 168 hours.     SignedRamiro Harvest MD [facility] 06/09/2014, 2:58 PM

## 2014-06-13 ENCOUNTER — Non-Acute Institutional Stay (SKILLED_NURSING_FACILITY): Payer: Medicare Other | Admitting: Internal Medicine

## 2014-06-13 DIAGNOSIS — S42302D Unspecified fracture of shaft of humerus, left arm, subsequent encounter for fracture with routine healing: Secondary | ICD-10-CM

## 2014-06-13 DIAGNOSIS — F329 Major depressive disorder, single episode, unspecified: Secondary | ICD-10-CM

## 2014-06-13 DIAGNOSIS — N390 Urinary tract infection, site not specified: Secondary | ICD-10-CM | POA: Diagnosis not present

## 2014-06-13 DIAGNOSIS — E038 Other specified hypothyroidism: Secondary | ICD-10-CM

## 2014-06-13 DIAGNOSIS — B9620 Unspecified Escherichia coli [E. coli] as the cause of diseases classified elsewhere: Secondary | ICD-10-CM

## 2014-06-13 DIAGNOSIS — F32A Depression, unspecified: Secondary | ICD-10-CM

## 2014-06-13 DIAGNOSIS — D649 Anemia, unspecified: Secondary | ICD-10-CM

## 2014-06-13 DIAGNOSIS — R531 Weakness: Secondary | ICD-10-CM | POA: Diagnosis not present

## 2014-06-13 NOTE — Progress Notes (Signed)
Patient ID: Diamond [name], female   DOB: 22-Jul-1926, 80 y.o.   MRN: [id number]    this is an acute visit.  Level and continues on this for a one-week course treated with Rocephin 1 cultures came back positive for pansensitive Escherichia coli-she was transitioned to oral Ceftin care skilled.  Facility [facility].  Chief complaint-acute visit status post hospitalization for weakness with history of UTI.  History of present illness.  Patient is a pleasant 80 year old female with a history of hypertension-hypothyroidism-presented to the ER complaining of generalized weakness and falls.  She actually had a previous left humeral fracture because of a fall.  Apparently her husband was unable to care for her at home because of her continuous falls.  She also stated over the past several months she's had several dark stools-.  In the ER she was found to have a positive fecal occult as well as an Escherichia coli UTI and was admitted.  Urine culture did grew out Escherichia coli this was treated empirically with Rocephin and then switched to Ceftin once sensitivities were known.  In regards to the occult positive stool-unknown etiology-hemoglobin remained stable during hospitalization with no overt GI bleed.  She was seen by GI who felt if patient's hemoglobin remains stable no further endoscopic evaluation was needed.  She was started on a PPI.  In regards to her generalized weakness this was thought secondary to general debility in the UTI-she is here for rehabilitation.  Her humeral fracture on the left is followed by Dr. Doran Stableruda-she continues in a sling.  Previous medical history.  Escherichia coli UTI.  Anemia.  Occult positive stool.  Weakness.  Left humeral fracture.  Depression.  Hypothyroidism.  Overactive bladder.  Family medical social history as been reviewed per discharge note on 06/09/2014.  Medications reviewed per MAR.  It includes.  Ceftin 500 mg  daily until December 12.  Iron 150 mg daily.  Protonic 40 mg daily.   Percocet 11/04/2023 milligrams 1 or 2 tabs every 4 hours as needed for severe pain.  Celexa 20 mg daily.  Synthroid 50 g daily.  Zocor 40 mg daily.  Vesicare 5 mg daily.  Review of systems.  In general does not complain of fever chills continues with significant weakness generalized.  Skin does not complain of rashes or itching.  Head ears eyes nose mouth and throat does not complain of sore throat or visual changes.  Respiratory-does not complain of shortness of breath or cough currently-.  Cardiac does not complain of chest pain does not have significant lower extrem some history of occult positive stool she is on a proton pump inhibitor-she does not complain of abdominal discomfort nausea or vomiting diarrhea or constipation.  Musculoskeletal is status post left humeral fracture does at times have pain depending on the position of her arm apparently the Percocet does help.  Neurologic does not complain of dizziness headache or numbness.  Psych does have a history of depression and apparently according in nursing episodes of confusion at times this is also what her husband tells me.  Physical exam.  Temperature is 97.2 pulse 60 respirations 18 blood pressure 130/70.  In general this is a pleasant elderly female in no distress lying in bed.  Her skin is warm and dry.  She does have some bruising at the site of her left humerus this is chronic per patient and her husband.  Eyes pupils appear reactive to light visual acuity appears intact.  Oropharynx is clear mucous membranes  appear fairly moist.  Chest is clear to auscultation there is no labored breathing.  Heart is regular rate and rhythm without murmur gallop or rub she has pedal pulse is intact-there is no significant lower extremity edema.  Abdomen is soft nontender with positive bowel sounds.  Muscle skeletal limited exam since patient  is in bed is able to move all extremities 4 I do not note any deformities other than having a sling on her left arm there is some mild edema of the arm radial pulse is intact as well as capillary refill.  Neurologic is grossly intact her speech is clear I do not see any lateralizing findings.  Psych she appears grossly alert and oriented although she does have at times periods of confusion -she appeared to have somewhat of a sketchy history of her humeral fracture and treatment -- while discussing that with her and her husband at bedside  Labs.  This #7 2015.  WBC 8.3 hemoglobin 9.6 platelets 288.  Sodium 139 potassium 3.7 BUN 18 creatinine 1.08.  Assessment and plan.   #1-UTI-she is completing a course of Ceftin at this point appears relatively asymptomatic she has been afebrile at this point continue to monitor she does have some mild confusion although suspect this may be relatively baseline.  #2-history of left humeral fracture-this is followed by orthopedics-she also will be obtaining physical therapy.  #3 history of occult positive stool-as noted above conservative follow-up as long as hemoglobin remains stable and there is no overt GI bleeding-I discussed this with her husband at bedside-will monitor CBC next laboratory day-.  Again this has been assessed by GI in the hospital- and she is on a proton pump inhibitor.  #4-depression-she is on Celexa this appears to be relatively stable she does not appear to be overtly depressed she is pleasant and engaged on exam this evening.  #5-history of anemia-she is on iron again will update CBC next laboratory day.  #6-history of thyroidism she continues on Synthroid. TSH at hospital was within normal limits at 3.89   #7-history hyperlipidemia-she is on Zocor-since I suspect her stay here to be fairly short will not be aggressive pursuing lipid panel again will monitor certainly status changes will be more aggressive pursuing lab work in  this regard-we will update liver function tests.  History of overactive bladder she continues on Enablex   [id number]-of note greater than 35 minutes spent assessing patient- reviewing her hospital records-discussing her status with her husband at bedside-and coordinating and formulating a plan of care for numerous diagnoses-of note greater than 50% of time spent coordinating plan of care r

## 2014-06-15 ENCOUNTER — Encounter: Payer: Self-pay | Admitting: Internal Medicine

## 2014-06-18 ENCOUNTER — Non-Acute Institutional Stay (SKILLED_NURSING_FACILITY): Payer: Medicare Other | Admitting: Internal Medicine

## 2014-06-18 ENCOUNTER — Encounter: Payer: Self-pay | Admitting: Internal Medicine

## 2014-06-18 DIAGNOSIS — R531 Weakness: Secondary | ICD-10-CM | POA: Diagnosis not present

## 2014-06-18 DIAGNOSIS — F329 Major depressive disorder, single episode, unspecified: Secondary | ICD-10-CM | POA: Diagnosis not present

## 2014-06-18 DIAGNOSIS — E7800 Pure hypercholesterolemia, unspecified: Secondary | ICD-10-CM | POA: Insufficient documentation

## 2014-06-18 DIAGNOSIS — F32A Depression, unspecified: Secondary | ICD-10-CM

## 2014-06-18 DIAGNOSIS — S42302D Unspecified fracture of shaft of humerus, left arm, subsequent encounter for fracture with routine healing: Secondary | ICD-10-CM | POA: Diagnosis not present

## 2014-06-18 DIAGNOSIS — B9620 Unspecified Escherichia coli [E. coli] as the cause of diseases classified elsewhere: Secondary | ICD-10-CM

## 2014-06-18 DIAGNOSIS — D649 Anemia, unspecified: Secondary | ICD-10-CM | POA: Diagnosis not present

## 2014-06-18 DIAGNOSIS — N390 Urinary tract infection, site not specified: Secondary | ICD-10-CM | POA: Diagnosis not present

## 2014-06-18 NOTE — Assessment & Plan Note (Signed)
Continued on home regimen of Celexa.

## 2014-06-18 NOTE — Assessment & Plan Note (Signed)
Continue zocor 40 nmg

## 2014-06-18 NOTE — Progress Notes (Signed)
MRN: [id number] Name: Diamond [name]  Sex: female Age: 80 y.o. DOB: Jan 17, 1927  [name] Facility[facility] Level Of Care: SNF Provider: [name], Ayliana Casciano D Emergency Contacts: Extended Emergency Contact Information Primary Emergency Contact: Bratz,[name] Address: [address]          [place], [place] [address] [place] Home Phone: [phone] Mobile Phone: [phone] Relation: Spouse Secondary Emergency Contact: [name],[name] Address: [address]          [place], [place] [address] [place] Mobile Phone: [phone] Relation: Daughter  Code Status: FULL  Allergies: Codeine and Penicillins  Chief Complaint  Patient presents with  . New Admit To SNF    HPI: Patient is 80 y.o. female who was hospitalized for UTI and admitted to SNF for OT/PT for freq falls and generalized weakness.  Past Medical History  Diagnosis Date  . Hypertension   . Hypothyroidism   . History of gout   . History of blood transfusion     "related to my hip OR"  . Migraine     "q 5-6 years" (06/06/2014)  . Arthritis     "everywhere"  . High cholesterol     Past Surgical History  Procedure Laterality Date  . Tubal ligation    . Joint replacement    . Cholecystectomy  1970's  . Total hip arthroplasty Right 1980's  . Fracture surgery    . Ankle fracture surgery Right 1988    "crushed it"  . Dilation and curettage of uterus        Medication List       This list is accurate as of: 06/18/14  7:44 PM.  Always use your most recent med list.               cefUROXime 500 MG tablet  Commonly known as:  CEFTIN  Take 1 tablet (500 mg total) by mouth 2 (two) times daily with a meal. Take for 5 days then stop.     citalopram 20 MG tablet  Commonly known as:  CELEXA  Take 20 mg by mouth daily.     iron polysaccharides 150 MG capsule  Commonly known as:  NIFEREX  Take 1 capsule (150 mg total) by mouth daily.     levothyroxine 50  MCG tablet  Commonly known as:  SYNTHROID, LEVOTHROID  Take 50 mcg by mouth daily before breakfast.     oxyCODONE-acetaminophen 5-325 MG per tablet  Commonly known as:  PERCOCET/ROXICET  Take 1-2 tablets by mouth every 4 (four) hours as needed for severe pain.     pantoprazole 40 MG tablet  Commonly known as:  PROTONIX  Take 1 tablet (40 mg total) by mouth daily.     simvastatin 40 MG tablet  Commonly known as:  ZOCOR  Take 40 mg by mouth daily.     solifenacin 5 MG tablet  Commonly known as:  VESICARE  Take 5 mg by mouth daily.        No orders of the defined types were placed in this encounter.    Immunization History  Administered Date(s) Administered  . Influenza-Unspecified 05/04/2014    History  Substance Use Topics  . Smoking status: Former Smoker -- 2.00 packs/day for 40 years    Types: Cigarettes    Quit date: 05/05/1987  . Smokeless tobacco: Never Used  . Alcohol Use: Yes     Comment: "stopped drinking in 2000"    Family history is noncontributory  Review of Systems  DATA OBTAINED: from  family member GENERAL:  no fevers, fatigue better, appetite changes SKIN: No itching, rash or wounds EYES: No eye pain, redness, discharge EARS: No earache, tinnitus, change in hearing NOSE: No congestion, drainage or bleeding  MOUTH/THROAT: No mouth or tooth pain, No sore throat RESPIRATORY: No cough, wheezing, SOB CARDIAC: No chest pain, palpitations, lower extremity edema  GI: No abdominal pain, No N/V/D or constipation, No heartburn or reflux  GU: No dysuria, frequency or urgency, or incontinence  MUSCULOSKELETAL: No unrelieved bone/joint pain NEUROLOGIC: No headache, dizziness or focal weakness PSYCHIATRIC: No overt anxiety or sadness, No behavior issue.   Filed Vitals:   06/18/14 1750  BP: 110/70  Pulse: 77  Temp: 97.1 F (36.2 C)  Resp: 18    Physical Exam  GENERAL APPEARANCE: Alert, conversant,  No acute distress.  SKIN: No diaphoresis  rash HEAD: Normocephalic, atraumatic  EYES: Conjunctiva/lids clear. Pupils round, reactive. EOMs intact.  EARS: External exam WNL, canals clear. Hearing grossly normal.  NOSE: No deformity or discharge.  MOUTH/THROAT: Lips w/o lesions  RESPIRATORY: Breathing is even, unlabored. Lung sounds are clear   CARDIOVASCULAR: Heart RRR no murmurs, rubs or gallops. No peripheral edema.   GASTROINTESTINAL: Abdomen is soft, non-tender, not distended w/ normal bowel sounds. GENITOURINARY: Bladder non tender, not distended  MUSCULOSKELETAL: No abnormal joints or musculature NEUROLOGIC:  Cranial nerves 2-12 grossly intact. Moves all extremities  PSYCHIATRIC: Mood and affect appropriate to situation, no behavioral issues  Patient Active Problem List   Diagnosis Date Noted  . High cholesterol   . UTI (lower urinary tract infection)   . Anemia 06/06/2014  . E. coli UTI 06/06/2014  . Generalized weakness 06/06/2014  . Left humeral fracture 06/06/2014  . Depression 06/06/2014  . Hypothyroidism 06/06/2014  . Overactive bladder 06/06/2014  . Normocytic anemia 06/06/2014  . Frequent falls   . Bleeding gastrointestinal     CBC    Component Value Date/Time   WBC 8.3 06/09/2014 0600   RBC 3.01* 06/09/2014 0600   RBC 3.02* 06/06/2014 1317   HGB 9.6* 06/09/2014 0600   HCT 28.5* 06/09/2014 0600   PLT 288 06/09/2014 0600   MCV 94.7 06/09/2014 0600   LYMPHSABS 1.5 06/06/2014 1128   MONOABS 0.8 06/06/2014 1128   EOSABS 0.1 06/06/2014 1128   BASOSABS 0.0 06/06/2014 1128    CMP     Component Value Date/Time   NA 139 06/09/2014 0600   K 3.7 06/09/2014 0600   CL 100 06/09/2014 0600   CO2 24 06/09/2014 0600   GLUCOSE 107* 06/09/2014 0600   BUN 19 06/09/2014 0600   CREATININE 1.08 06/09/2014 0600   CALCIUM 9.0 06/09/2014 0600   PROT 7.4 06/06/2014 1128   ALBUMIN 3.9 06/06/2014 1128   AST 32 06/06/2014 1128   ALT 13 06/06/2014 1128   ALKPHOS 63 06/06/2014 1128   BILITOT 0.6 06/06/2014 1128    GFRNONAA 45* 06/09/2014 0600   GFRAA 53* 06/09/2014 0600    Assessment and Plan  E. coli UTI E Coli urinary tract infection On admission urinalysis done was consistent with a UTI. Patient was placed empirically on IV Rocephin. Urine cultures grew Escherichia coli which was pansensitive. Patient was transitioned to oral Ceftin and will be discharged on 5 days of oral Ceftin to complete a one-week course of antibiotic therapy.    Normocytic anemia Unknown etiology. Patient had FOBT which was positive but her hemoglobin has remained stable. Patient with no overt GI bleed.  Patient hemodynamically stable. Patient has been seen in consultation by gastroenterology, who felt that if patient's hemoglobin remained stable no further endoscopic evaluation is needed at this time. Patient was placed on a PPI. Patient be discharged on a PPI. Patient's hemoglobin remained stable 9.6, throughout the hospitalization.  Generalized weakness Likely secondary to urinary tract infection and debility. Patient was empirically placed on IV Rocephin. Urine cultures with E Coli, which was pansensitive. Patient will be transitioned to oral Ceftin and will need 5 more days of oral Ceftin to complete a one-week course of antibiotic therapy. Patient be discharged to skilled nursing facility.   Left humeral fracture Patient prior to admission was recently seen after a fall on 06/03/2014 after sustaining a fall and found to have a left humeral fracture. Patient was placed in a sling. Patient was admitted and her pain was managed. Patient will follow-up with Dr. Lajoyce Cornersuda of orthopedics as outpatient on 06/19/2014.  Depression Continued on home regimen of Celexa.   High cholesterol Continue zocor 40 nmg    Margit HanksALEXANDER, Teddy Pena D, MD

## 2014-06-18 NOTE — Assessment & Plan Note (Signed)
Patient prior to admission was recently seen after a fall on 06/03/2014 after sustaining a fall and found to have a left humeral fracture. Patient was placed in a sling. Patient was admitted and her pain was managed. Patient will follow-up with Dr. Lajoyce Cornersuda of orthopedics as outpatient on 06/19/2014.

## 2014-06-18 NOTE — Assessment & Plan Note (Signed)
Unknown etiology. Patient had FOBT which was positive but her hemoglobin has remained stable. Patient with no overt GI bleed. Patient hemodynamically stable. Patient has been seen in consultation by gastroenterology, who felt that if patient's hemoglobin remained stable no further endoscopic evaluation is needed at this time. Patient was placed on a PPI. Patient be discharged on a PPI. Patient's hemoglobin remained stable 9.6, throughout the hospitalization.

## 2014-06-18 NOTE — Assessment & Plan Note (Signed)
Likely secondary to urinary tract infection and debility. Patient was empirically placed on IV Rocephin. Urine cultures with E Coli, which was pansensitive. Patient will be transitioned to oral Ceftin and will need 5 more days of oral Ceftin to complete a one-week course of antibiotic therapy. Patient be discharged to skilled nursing facility.

## 2014-06-18 NOTE — Assessment & Plan Note (Signed)
E Coli urinary tract infection On admission urinalysis done was consistent with a UTI. Patient was placed empirically on IV Rocephin. Urine cultures grew Escherichia coli which was pansensitive. Patient was transitioned to oral Ceftin and will be discharged on 5 days of oral Ceftin to complete a one-week course of antibiotic therapy.

## 2014-06-20 DIAGNOSIS — S42292D Other displaced fracture of upper end of left humerus, subsequent encounter for fracture with routine healing: Secondary | ICD-10-CM | POA: Diagnosis not present

## 2014-07-02 ENCOUNTER — Non-Acute Institutional Stay (SKILLED_NURSING_FACILITY): Payer: Medicare Other | Admitting: Internal Medicine

## 2014-07-02 ENCOUNTER — Encounter: Payer: Self-pay | Admitting: Internal Medicine

## 2014-07-02 DIAGNOSIS — B9620 Unspecified Escherichia coli [E. coli] as the cause of diseases classified elsewhere: Secondary | ICD-10-CM | POA: Diagnosis not present

## 2014-07-02 DIAGNOSIS — F32A Depression, unspecified: Secondary | ICD-10-CM

## 2014-07-02 DIAGNOSIS — D649 Anemia, unspecified: Secondary | ICD-10-CM

## 2014-07-02 DIAGNOSIS — E038 Other specified hypothyroidism: Secondary | ICD-10-CM | POA: Diagnosis not present

## 2014-07-02 DIAGNOSIS — E7800 Pure hypercholesterolemia, unspecified: Secondary | ICD-10-CM

## 2014-07-02 DIAGNOSIS — S42302D Unspecified fracture of shaft of humerus, left arm, subsequent encounter for fracture with routine healing: Secondary | ICD-10-CM

## 2014-07-02 DIAGNOSIS — F329 Major depressive disorder, single episode, unspecified: Secondary | ICD-10-CM

## 2014-07-02 DIAGNOSIS — E034 Atrophy of thyroid (acquired): Secondary | ICD-10-CM

## 2014-07-02 DIAGNOSIS — N390 Urinary tract infection, site not specified: Secondary | ICD-10-CM | POA: Diagnosis not present

## 2014-07-02 DIAGNOSIS — R531 Weakness: Secondary | ICD-10-CM | POA: Diagnosis not present

## 2014-07-02 NOTE — Progress Notes (Signed)
MRN: [id number] Name: Diamond [name]  Sex: female Age: 80 y.o. DOB: [birth year]  [name] Facility/Room:110 Level Of Care: SNF Provider: Merrilee [name], ANNE [name] Emergency Contacts: Extended Emergency Contact Information Primary Emergency Contact: [name],Diamond T Address: [address]          [place], [place] [address] [place] Home Phone: [phone] Mobile Phone: [phone] Relation: Spouse Secondary Emergency Contact: [name],Diamond Address: [address]          [place], [place] [address] [place] Mobile Phone: [phone] Relation: Daughter  Code Status: FULL  Allergies: Codeine and Penicillins  Chief Complaint  Patient presents with  . Discharge Note    HPI: Patient is 80 y.o. female who was admitted for frequent falls and weakness from urosepsis who is now ready to go home.  Past Medical History  Diagnosis Date  . Hypertension   . Hypothyroidism   . History of gout   . History of blood transfusion     "related to my hip OR"  . Migraine     "q 5-6 years" (06/06/2014)  . Arthritis     "everywhere"  . High cholesterol     Past Surgical History  Procedure Laterality Date  . Tubal ligation    . Joint replacement    . Cholecystectomy  1970's  . Total hip arthroplasty Right 1980's  . Fracture surgery    . Ankle fracture surgery Right 1988    "crushed it"  . Dilation and curettage of uterus        Medication List       This list is accurate as of: 07/02/14 11:52 AM.  Always use your most recent med list.               citalopram 20 MG tablet  Commonly known as:  CELEXA  Take 20 mg by mouth daily.     iron polysaccharides 150 MG capsule  Commonly known as:  NIFEREX  Take 1 capsule (150 mg total) by mouth daily.     levothyroxine 50 MCG tablet  Commonly known as:  SYNTHROID, LEVOTHROID  Take 50 mcg by mouth daily before breakfast.     oxyCODONE-acetaminophen 5-325 MG per tablet  Commonly known as:   PERCOCET/ROXICET  Take 1-2 tablets by mouth every 4 (four) hours as needed for severe pain.     pantoprazole 40 MG tablet  Commonly known as:  PROTONIX  Take 1 tablet (40 mg total) by mouth daily.     simvastatin 40 MG tablet  Commonly known as:  ZOCOR  Take 40 mg by mouth daily.     solifenacin 5 MG tablet  Commonly known as:  VESICARE  Take 5 mg by mouth daily.        No orders of the defined types were placed in this encounter.    Immunization History  Administered Date(s) Administered  . Influenza-Unspecified 05/04/2014    History  Substance Use Topics  . Smoking status: Former Smoker -- 2.00 packs/day for 40 years    Types: Cigarettes    Quit date: 05/05/1987  . Smokeless tobacco: Never Used  . Alcohol Use: Yes     Comment: "stopped drinking in 2000"    Filed Vitals:   07/02/14 1125  BP: 125/70  Pulse: 80  Temp: 98 F (36.7 C)  Resp: 20    Physical Exam  GENERAL APPEARANCE: Alert, conversant. No acute distress.  HEENT: Unremarkable. RESPIRATORY: Breathing is even, unlabored. Lung sounds  are clear   CARDIOVASCULAR: Heart RRR no murmurs, rubs or gallops. No peripheral edema.  GASTROINTESTINAL: Abdomen is soft, non-tender, not distended w/ normal bowel sounds.  NEUROLOGIC: Cranial nerves 2-12 grossly intact. Moves all extremities  Patient Active Problem List   Diagnosis Date Noted  . High cholesterol   . UTI (lower urinary tract infection)   . Anemia 06/06/2014  . E. coli UTI 06/06/2014  . Generalized weakness 06/06/2014  . Left humeral fracture 06/06/2014  . Depression 06/06/2014  . Hypothyroidism 06/06/2014  . Overactive bladder 06/06/2014  . Normocytic anemia 06/06/2014  . Frequent falls   . Bleeding gastrointestinal     CBC    Component Value Date/Time   WBC 8.3 06/09/2014 0600   RBC 3.01* 06/09/2014 0600   RBC 3.02* 06/06/2014 1317   HGB 9.6* 06/09/2014 0600   HCT 28.5* 06/09/2014 0600   PLT 288 06/09/2014 0600   MCV 94.7  06/09/2014 0600   LYMPHSABS 1.5 06/06/2014 1128   MONOABS 0.8 06/06/2014 1128   EOSABS 0.1 06/06/2014 1128   BASOSABS 0.0 06/06/2014 1128    CMP     Component Value Date/Time   NA 139 06/09/2014 0600   K 3.7 06/09/2014 0600   CL 100 06/09/2014 0600   CO2 24 06/09/2014 0600   GLUCOSE 107* 06/09/2014 0600   BUN 19 06/09/2014 0600   CREATININE 1.08 06/09/2014 0600   CALCIUM 9.0 06/09/2014 0600   PROT 7.4 06/06/2014 1128   ALBUMIN 3.9 06/06/2014 1128   AST 32 06/06/2014 1128   ALT 13 06/06/2014 1128   ALKPHOS 63 06/06/2014 1128   BILITOT 0.6 06/06/2014 1128   GFRNONAA 45* 06/09/2014 0600   GFRAA 53* 06/09/2014 0600    Assessment and Plan  Pt is stable to be [name]/c to home with HH/OT/PT/nursing/CNA. DME - cane.  Diamond [name], [name], MD

## 2014-07-05 DIAGNOSIS — Z87440 Personal history of urinary (tract) infections: Secondary | ICD-10-CM | POA: Diagnosis not present

## 2014-07-05 DIAGNOSIS — D649 Anemia, unspecified: Secondary | ICD-10-CM | POA: Diagnosis not present

## 2014-07-05 DIAGNOSIS — Z9181 History of falling: Secondary | ICD-10-CM | POA: Diagnosis not present

## 2014-07-05 DIAGNOSIS — S42302D Unspecified fracture of shaft of humerus, left arm, subsequent encounter for fracture with routine healing: Secondary | ICD-10-CM | POA: Diagnosis not present

## 2014-07-05 DIAGNOSIS — M6281 Muscle weakness (generalized): Secondary | ICD-10-CM | POA: Diagnosis not present

## 2014-07-09 DIAGNOSIS — S42302D Unspecified fracture of shaft of humerus, left arm, subsequent encounter for fracture with routine healing: Secondary | ICD-10-CM | POA: Diagnosis not present

## 2014-07-09 DIAGNOSIS — M6281 Muscle weakness (generalized): Secondary | ICD-10-CM | POA: Diagnosis not present

## 2014-07-09 DIAGNOSIS — D649 Anemia, unspecified: Secondary | ICD-10-CM | POA: Diagnosis not present

## 2014-07-09 DIAGNOSIS — Z87440 Personal history of urinary (tract) infections: Secondary | ICD-10-CM | POA: Diagnosis not present

## 2014-07-09 DIAGNOSIS — Z9181 History of falling: Secondary | ICD-10-CM | POA: Diagnosis not present

## 2014-07-11 DIAGNOSIS — N390 Urinary tract infection, site not specified: Secondary | ICD-10-CM | POA: Diagnosis not present

## 2014-07-11 DIAGNOSIS — I10 Essential (primary) hypertension: Secondary | ICD-10-CM | POA: Diagnosis not present

## 2014-07-11 DIAGNOSIS — Z9181 History of falling: Secondary | ICD-10-CM | POA: Diagnosis not present

## 2014-07-11 DIAGNOSIS — S42302D Unspecified fracture of shaft of humerus, left arm, subsequent encounter for fracture with routine healing: Secondary | ICD-10-CM | POA: Diagnosis not present

## 2014-07-11 DIAGNOSIS — E039 Hypothyroidism, unspecified: Secondary | ICD-10-CM | POA: Diagnosis not present

## 2014-07-11 DIAGNOSIS — F329 Major depressive disorder, single episode, unspecified: Secondary | ICD-10-CM | POA: Diagnosis not present

## 2014-07-11 DIAGNOSIS — M6281 Muscle weakness (generalized): Secondary | ICD-10-CM | POA: Diagnosis not present

## 2014-07-11 DIAGNOSIS — Z87440 Personal history of urinary (tract) infections: Secondary | ICD-10-CM | POA: Diagnosis not present

## 2014-07-11 DIAGNOSIS — D649 Anemia, unspecified: Secondary | ICD-10-CM | POA: Diagnosis not present

## 2014-07-11 DIAGNOSIS — S42309A Unspecified fracture of shaft of humerus, unspecified arm, initial encounter for closed fracture: Secondary | ICD-10-CM | POA: Diagnosis not present

## 2014-07-11 DIAGNOSIS — E782 Mixed hyperlipidemia: Secondary | ICD-10-CM | POA: Diagnosis not present

## 2014-07-11 DIAGNOSIS — R413 Other amnesia: Secondary | ICD-10-CM | POA: Diagnosis not present

## 2014-07-14 DIAGNOSIS — D649 Anemia, unspecified: Secondary | ICD-10-CM | POA: Diagnosis not present

## 2014-07-14 DIAGNOSIS — Z87440 Personal history of urinary (tract) infections: Secondary | ICD-10-CM | POA: Diagnosis not present

## 2014-07-14 DIAGNOSIS — Z9181 History of falling: Secondary | ICD-10-CM | POA: Diagnosis not present

## 2014-07-14 DIAGNOSIS — M6281 Muscle weakness (generalized): Secondary | ICD-10-CM | POA: Diagnosis not present

## 2014-07-14 DIAGNOSIS — S42302D Unspecified fracture of shaft of humerus, left arm, subsequent encounter for fracture with routine healing: Secondary | ICD-10-CM | POA: Diagnosis not present

## 2014-07-17 DIAGNOSIS — S42309A Unspecified fracture of shaft of humerus, unspecified arm, initial encounter for closed fracture: Secondary | ICD-10-CM | POA: Diagnosis not present

## 2014-07-17 DIAGNOSIS — E782 Mixed hyperlipidemia: Secondary | ICD-10-CM | POA: Diagnosis not present

## 2014-07-17 DIAGNOSIS — N390 Urinary tract infection, site not specified: Secondary | ICD-10-CM | POA: Diagnosis not present

## 2014-07-17 DIAGNOSIS — I10 Essential (primary) hypertension: Secondary | ICD-10-CM | POA: Diagnosis not present

## 2014-07-17 DIAGNOSIS — S42292D Other displaced fracture of upper end of left humerus, subsequent encounter for fracture with routine healing: Secondary | ICD-10-CM | POA: Diagnosis not present

## 2014-07-17 DIAGNOSIS — E039 Hypothyroidism, unspecified: Secondary | ICD-10-CM | POA: Diagnosis not present

## 2014-07-17 DIAGNOSIS — R413 Other amnesia: Secondary | ICD-10-CM | POA: Diagnosis not present

## 2014-07-17 DIAGNOSIS — R195 Other fecal abnormalities: Secondary | ICD-10-CM | POA: Diagnosis not present

## 2014-07-17 DIAGNOSIS — M109 Gout, unspecified: Secondary | ICD-10-CM | POA: Diagnosis not present

## 2014-07-18 DIAGNOSIS — M6281 Muscle weakness (generalized): Secondary | ICD-10-CM | POA: Diagnosis not present

## 2014-07-18 DIAGNOSIS — Z9181 History of falling: Secondary | ICD-10-CM | POA: Diagnosis not present

## 2014-07-18 DIAGNOSIS — S42302D Unspecified fracture of shaft of humerus, left arm, subsequent encounter for fracture with routine healing: Secondary | ICD-10-CM | POA: Diagnosis not present

## 2014-07-18 DIAGNOSIS — N390 Urinary tract infection, site not specified: Secondary | ICD-10-CM | POA: Diagnosis not present

## 2014-07-18 DIAGNOSIS — Z87440 Personal history of urinary (tract) infections: Secondary | ICD-10-CM | POA: Diagnosis not present

## 2014-07-18 DIAGNOSIS — D649 Anemia, unspecified: Secondary | ICD-10-CM | POA: Diagnosis not present

## 2014-07-21 DIAGNOSIS — D649 Anemia, unspecified: Secondary | ICD-10-CM | POA: Diagnosis not present

## 2014-07-21 DIAGNOSIS — Z87440 Personal history of urinary (tract) infections: Secondary | ICD-10-CM | POA: Diagnosis not present

## 2014-07-21 DIAGNOSIS — S42302D Unspecified fracture of shaft of humerus, left arm, subsequent encounter for fracture with routine healing: Secondary | ICD-10-CM | POA: Diagnosis not present

## 2014-07-21 DIAGNOSIS — Z9181 History of falling: Secondary | ICD-10-CM | POA: Diagnosis not present

## 2014-07-21 DIAGNOSIS — M6281 Muscle weakness (generalized): Secondary | ICD-10-CM | POA: Diagnosis not present

## 2014-07-24 DIAGNOSIS — D649 Anemia, unspecified: Secondary | ICD-10-CM | POA: Diagnosis not present

## 2014-07-24 DIAGNOSIS — Z87440 Personal history of urinary (tract) infections: Secondary | ICD-10-CM | POA: Diagnosis not present

## 2014-07-24 DIAGNOSIS — Z9181 History of falling: Secondary | ICD-10-CM | POA: Diagnosis not present

## 2014-07-24 DIAGNOSIS — M6281 Muscle weakness (generalized): Secondary | ICD-10-CM | POA: Diagnosis not present

## 2014-07-24 DIAGNOSIS — S42302D Unspecified fracture of shaft of humerus, left arm, subsequent encounter for fracture with routine healing: Secondary | ICD-10-CM | POA: Diagnosis not present

## 2014-07-28 DIAGNOSIS — Z9181 History of falling: Secondary | ICD-10-CM | POA: Diagnosis not present

## 2014-07-28 DIAGNOSIS — S42302D Unspecified fracture of shaft of humerus, left arm, subsequent encounter for fracture with routine healing: Secondary | ICD-10-CM | POA: Diagnosis not present

## 2014-07-28 DIAGNOSIS — M6281 Muscle weakness (generalized): Secondary | ICD-10-CM | POA: Diagnosis not present

## 2014-07-28 DIAGNOSIS — D649 Anemia, unspecified: Secondary | ICD-10-CM | POA: Diagnosis not present

## 2014-07-28 DIAGNOSIS — Z87440 Personal history of urinary (tract) infections: Secondary | ICD-10-CM | POA: Diagnosis not present

## 2014-08-01 DIAGNOSIS — Z9181 History of falling: Secondary | ICD-10-CM | POA: Diagnosis not present

## 2014-08-01 DIAGNOSIS — S42302D Unspecified fracture of shaft of humerus, left arm, subsequent encounter for fracture with routine healing: Secondary | ICD-10-CM | POA: Diagnosis not present

## 2014-08-01 DIAGNOSIS — Z87440 Personal history of urinary (tract) infections: Secondary | ICD-10-CM | POA: Diagnosis not present

## 2014-08-01 DIAGNOSIS — M6281 Muscle weakness (generalized): Secondary | ICD-10-CM | POA: Diagnosis not present

## 2014-08-01 DIAGNOSIS — D649 Anemia, unspecified: Secondary | ICD-10-CM | POA: Diagnosis not present

## 2014-08-14 DIAGNOSIS — Z0001 Encounter for general adult medical examination with abnormal findings: Secondary | ICD-10-CM | POA: Diagnosis not present

## 2014-08-14 DIAGNOSIS — N183 Chronic kidney disease, stage 3 (moderate): Secondary | ICD-10-CM | POA: Diagnosis not present

## 2014-08-14 DIAGNOSIS — R413 Other amnesia: Secondary | ICD-10-CM | POA: Diagnosis not present

## 2014-08-14 DIAGNOSIS — Z1389 Encounter for screening for other disorder: Secondary | ICD-10-CM | POA: Diagnosis not present

## 2014-08-14 DIAGNOSIS — I10 Essential (primary) hypertension: Secondary | ICD-10-CM | POA: Diagnosis not present

## 2014-08-14 DIAGNOSIS — D649 Anemia, unspecified: Secondary | ICD-10-CM | POA: Diagnosis not present

## 2014-08-14 DIAGNOSIS — E039 Hypothyroidism, unspecified: Secondary | ICD-10-CM | POA: Diagnosis not present

## 2014-08-14 DIAGNOSIS — E782 Mixed hyperlipidemia: Secondary | ICD-10-CM | POA: Diagnosis not present

## 2015-01-08 DIAGNOSIS — M545 Low back pain: Secondary | ICD-10-CM | POA: Diagnosis not present

## 2015-01-08 DIAGNOSIS — M25511 Pain in right shoulder: Secondary | ICD-10-CM | POA: Diagnosis not present

## 2015-02-12 DIAGNOSIS — I10 Essential (primary) hypertension: Secondary | ICD-10-CM | POA: Diagnosis not present

## 2015-02-12 DIAGNOSIS — N183 Chronic kidney disease, stage 3 (moderate): Secondary | ICD-10-CM | POA: Diagnosis not present

## 2015-02-12 DIAGNOSIS — E039 Hypothyroidism, unspecified: Secondary | ICD-10-CM | POA: Diagnosis not present

## 2015-02-12 DIAGNOSIS — E782 Mixed hyperlipidemia: Secondary | ICD-10-CM | POA: Diagnosis not present

## 2015-02-12 DIAGNOSIS — R413 Other amnesia: Secondary | ICD-10-CM | POA: Diagnosis not present

## 2015-06-25 DIAGNOSIS — R5383 Other fatigue: Secondary | ICD-10-CM | POA: Diagnosis not present

## 2015-06-25 DIAGNOSIS — R350 Frequency of micturition: Secondary | ICD-10-CM | POA: Diagnosis not present

## 2015-06-25 DIAGNOSIS — R413 Other amnesia: Secondary | ICD-10-CM | POA: Diagnosis not present

## 2015-06-25 DIAGNOSIS — F329 Major depressive disorder, single episode, unspecified: Secondary | ICD-10-CM | POA: Diagnosis not present

## 2015-06-25 DIAGNOSIS — E039 Hypothyroidism, unspecified: Secondary | ICD-10-CM | POA: Diagnosis not present

## 2015-07-27 DIAGNOSIS — E782 Mixed hyperlipidemia: Secondary | ICD-10-CM | POA: Diagnosis not present

## 2015-07-27 DIAGNOSIS — R413 Other amnesia: Secondary | ICD-10-CM | POA: Diagnosis not present

## 2015-07-27 DIAGNOSIS — F329 Major depressive disorder, single episode, unspecified: Secondary | ICD-10-CM | POA: Diagnosis not present

## 2015-07-27 DIAGNOSIS — Z23 Encounter for immunization: Secondary | ICD-10-CM | POA: Diagnosis not present

## 2015-07-27 DIAGNOSIS — I10 Essential (primary) hypertension: Secondary | ICD-10-CM | POA: Diagnosis not present

## 2015-07-27 DIAGNOSIS — E039 Hypothyroidism, unspecified: Secondary | ICD-10-CM | POA: Diagnosis not present

## 2015-08-24 DIAGNOSIS — Z1389 Encounter for screening for other disorder: Secondary | ICD-10-CM | POA: Diagnosis not present

## 2015-08-24 DIAGNOSIS — R413 Other amnesia: Secondary | ICD-10-CM | POA: Diagnosis not present

## 2015-08-24 DIAGNOSIS — E039 Hypothyroidism, unspecified: Secondary | ICD-10-CM | POA: Diagnosis not present

## 2015-08-24 DIAGNOSIS — I10 Essential (primary) hypertension: Secondary | ICD-10-CM | POA: Diagnosis not present

## 2015-08-24 DIAGNOSIS — Z0000 Encounter for general adult medical examination without abnormal findings: Secondary | ICD-10-CM | POA: Diagnosis not present

## 2015-08-24 DIAGNOSIS — E7800 Pure hypercholesterolemia, unspecified: Secondary | ICD-10-CM | POA: Diagnosis not present

## 2015-08-24 DIAGNOSIS — N183 Chronic kidney disease, stage 3 (moderate): Secondary | ICD-10-CM | POA: Diagnosis not present

## 2015-10-02 ENCOUNTER — Other Ambulatory Visit: Payer: Self-pay | Admitting: Internal Medicine

## 2015-10-02 DIAGNOSIS — Z1231 Encounter for screening mammogram for malignant neoplasm of breast: Secondary | ICD-10-CM

## 2015-10-16 DIAGNOSIS — N3020 Other chronic cystitis without hematuria: Secondary | ICD-10-CM | POA: Diagnosis not present

## 2015-10-16 DIAGNOSIS — N952 Postmenopausal atrophic vaginitis: Secondary | ICD-10-CM | POA: Diagnosis not present

## 2015-10-16 DIAGNOSIS — R350 Frequency of micturition: Secondary | ICD-10-CM | POA: Diagnosis not present

## 2015-10-16 DIAGNOSIS — R351 Nocturia: Secondary | ICD-10-CM | POA: Diagnosis not present

## 2015-10-21 ENCOUNTER — Ambulatory Visit
Admission: RE | Admit: 2015-10-21 | Discharge: 2015-10-21 | Disposition: A | Discharge status: Home / Self Care | Payer: Medicare Other | Source: Ambulatory Visit | Attending: Internal Medicine | Admitting: Internal Medicine

## 2015-10-21 DIAGNOSIS — Z1231 Encounter for screening mammogram for malignant neoplasm of breast: Secondary | ICD-10-CM

## 2016-04-08 DIAGNOSIS — N3020 Other chronic cystitis without hematuria: Secondary | ICD-10-CM | POA: Diagnosis not present

## 2016-04-08 DIAGNOSIS — N952 Postmenopausal atrophic vaginitis: Secondary | ICD-10-CM | POA: Diagnosis not present

## 2016-04-08 DIAGNOSIS — R8290 Unspecified abnormal findings in urine: Secondary | ICD-10-CM | POA: Diagnosis not present

## 2016-04-19 DIAGNOSIS — N183 Chronic kidney disease, stage 3 (moderate): Secondary | ICD-10-CM | POA: Diagnosis not present

## 2016-04-19 DIAGNOSIS — Z23 Encounter for immunization: Secondary | ICD-10-CM | POA: Diagnosis not present

## 2016-04-19 DIAGNOSIS — E7800 Pure hypercholesterolemia, unspecified: Secondary | ICD-10-CM | POA: Diagnosis not present

## 2016-04-19 DIAGNOSIS — R413 Other amnesia: Secondary | ICD-10-CM | POA: Diagnosis not present

## 2016-04-19 DIAGNOSIS — E039 Hypothyroidism, unspecified: Secondary | ICD-10-CM | POA: Diagnosis not present

## 2016-04-19 DIAGNOSIS — I10 Essential (primary) hypertension: Secondary | ICD-10-CM | POA: Diagnosis not present

## 2016-05-17 ENCOUNTER — Encounter (HOSPITAL_BASED_OUTPATIENT_CLINIC_OR_DEPARTMENT_OTHER): Payer: Self-pay | Admitting: *Deleted

## 2016-05-17 ENCOUNTER — Emergency Department (HOSPITAL_BASED_OUTPATIENT_CLINIC_OR_DEPARTMENT_OTHER)
Admission: EM | Admit: 2016-05-17 | Discharge: 2016-05-17 | Disposition: A | Discharge status: Home / Self Care | Payer: Medicare Other | Attending: Emergency Medicine | Admitting: Emergency Medicine

## 2016-05-17 DIAGNOSIS — R21 Rash and other nonspecific skin eruption: Secondary | ICD-10-CM | POA: Diagnosis present

## 2016-05-17 DIAGNOSIS — Z87891 Personal history of nicotine dependence: Secondary | ICD-10-CM | POA: Diagnosis not present

## 2016-05-17 DIAGNOSIS — E039 Hypothyroidism, unspecified: Secondary | ICD-10-CM | POA: Insufficient documentation

## 2016-05-17 DIAGNOSIS — Z79899 Other long term (current) drug therapy: Secondary | ICD-10-CM | POA: Diagnosis not present

## 2016-05-17 DIAGNOSIS — N3020 Other chronic cystitis without hematuria: Secondary | ICD-10-CM | POA: Diagnosis not present

## 2016-05-17 DIAGNOSIS — I10 Essential (primary) hypertension: Secondary | ICD-10-CM | POA: Diagnosis not present

## 2016-05-17 DIAGNOSIS — L259 Unspecified contact dermatitis, unspecified cause: Secondary | ICD-10-CM | POA: Diagnosis not present

## 2016-05-17 HISTORY — DX: Unspecified dementia, unspecified severity, without behavioral disturbance, psychotic disturbance, mood disturbance, and anxiety: F03.90

## 2016-05-17 LAB — URINALYSIS, ROUTINE W REFLEX MICROSCOPIC
BILIRUBIN URINE: NEGATIVE
GLUCOSE, UA: NEGATIVE mg/dL
HGB URINE DIPSTICK: NEGATIVE
KETONES UR: NEGATIVE mg/dL
Nitrite: NEGATIVE
PH: 5.5 (ref 5.0–8.0)
Protein, ur: NEGATIVE mg/dL
Specific Gravity, Urine: 1.019 (ref 1.005–1.030)

## 2016-05-17 LAB — URINE MICROSCOPIC-ADD ON

## 2016-05-17 MED ORDER — ZINC OXIDE 12.8 % EX OINT: 1.0000 "application " | TOPICAL_OINTMENT | CUTANEOUS | 0 refills | Status: DC | PRN

## 2016-05-17 NOTE — ED Notes (Signed)
ED Provider at bedside. 

## 2016-05-17 NOTE — ED Triage Notes (Signed)
Pt's husband reports seeing blood in pt's urine this morning. Pt reports being tx for UTI 1 months ago. Denies dysuria, frequency, urgency, fever, n/v/d. Pt also reports rash in bil groin and buttocks -- reports using Desitin ointment with no relief x2-3 wks.

## 2016-05-17 NOTE — Discharge Instructions (Signed)
The results of your urinalysis shows no blood in your urine, but it does show evidence of infection. Given your history of chronic UTIs, we sent your urine for culture, but will not start you on antibiotics since you are asymptomatic. Please call to schedule an appointment with your urologist this week to discuss possibly starting antibiotics, as well as reevaluation of your rash. Please apply the Triple Paste cream as prescribed for your rash, and make sure to keep your genital region clean and dry.

## 2016-05-17 NOTE — ED Provider Notes (Signed)
[facility] Provider Note   CSN: [id number] Arrival date & time: 05/17/16  1051     History   Chief Complaint Chief Complaint  Patient presents with  . Hematuria  . Rash    HPI Diamond [name] is a 82 y.o. female.  Patient is 82 yo F with history of chronic cystitis, atrophic vaginitis, and urge incontinence, presenting to ED after patient's husband noticed a "streak of blood" after she used the toilet this morning, and a worsening rash to her right groin and buttocks for the past 2 weeks. No fevers, abdominal pain, N/V/D, dysuria, urinary frequency, flank pain, or recent trauma to back or kidneys. She has noticed a little blood on the toilet paper when wiping, but denies any rectal pain, hemorrhoids, or blood in her stools. She was seen by her urologist on 10/6 for possible yeast infection, but denies taking Mycolog as prescribed. She has been placing Desitin ointment on the rash, with some relief of pain but no improvement.      Past Medical History:  Diagnosis Date  . Arthritis    "everywhere"  . Dementia   . High cholesterol   . History of blood transfusion    "related to my hip OR"  . History of gout   . Hypertension   . Hypothyroidism   . Migraine    "q 5-6 years" (06/06/2014)    Patient Active Problem List   Diagnosis Date Noted  . High cholesterol   . UTI (lower urinary tract infection)   . Anemia 06/06/2014  . E. coli UTI 06/06/2014  . Generalized weakness 06/06/2014  . Left humeral fracture 06/06/2014  . Depression 06/06/2014  . Hypothyroidism 06/06/2014  . Overactive bladder 06/06/2014  . Normocytic anemia 06/06/2014  . Frequent falls   . Bleeding gastrointestinal     Past Surgical History:  Procedure Laterality Date  . ABDOMINAL HYSTERECTOMY    . ANKLE FRACTURE SURGERY Right 1988   "crushed it"  . CHOLECYSTECTOMY  1970's  . DILATION AND CURETTAGE OF UTERUS    . FRACTURE SURGERY    . JOINT REPLACEMENT    . TOTAL HIP  ARTHROPLASTY Right 1980's  . TUBAL LIGATION      OB History    No data available       Home Medications    Prior to Admission medications   Medication Sig Start Date End Date Taking? Authorizing Provider  iron polysaccharides (NIFEREX) 150 MG capsule Take 1 capsule (150 mg total) by mouth daily. 06/09/14  Yes Rodolph Bonganiel V Thompson, MD  levothyroxine (SYNTHROID, LEVOTHROID) 50 MCG tablet Take 50 mcg by mouth daily before breakfast.   Yes Historical Provider, MD  citalopram (CELEXA) 20 MG tablet Take 20 mg by mouth daily.    Historical Provider, MD  oxyCODONE-acetaminophen (PERCOCET/ROXICET) 5-325 MG per tablet Take 1-2 tablets by mouth every 4 (four) hours as needed for severe pain. 06/09/14   Rodolph Bonganiel V Thompson, MD  pantoprazole (PROTONIX) 40 MG tablet Take 1 tablet (40 mg total) by mouth daily. 06/09/14   Rodolph Bonganiel V Thompson, MD  simvastatin (ZOCOR) 40 MG tablet Take 40 mg by mouth daily.    Historical Provider, MD  solifenacin (VESICARE) 5 MG tablet Take 5 mg by mouth daily.    Historical Provider, MD    Family History No family history on file.  Social History Social History  Substance Use Topics  . Smoking status: Former Smoker    Packs/day: 2.00    Years: 40.00  Types: Cigarettes    Quit date: 05/05/1987  . Smokeless tobacco: Never Used  . Alcohol use 4.2 oz/week    7 Glasses of wine per week     Allergies   Codeine and Penicillins   Review of Systems Review of Systems  Constitutional: Negative for chills and fever.  Gastrointestinal: Negative for abdominal pain, blood in stool, nausea and vomiting.  Genitourinary: Positive for vaginal pain (secondary to rash). Negative for dysuria, flank pain, frequency, vaginal bleeding and vaginal discharge.  Skin: Positive for rash. Negative for color change.     Physical Exam Updated Vital Signs BP 174/88 (BP Location: Right Arm)   Pulse 76   Temp 97.5 F (36.4 C) (Oral)   Resp 18   Ht 5' (1.524 m)   Wt 45.4 kg   SpO2  100%   BMI 19.53 kg/m   Physical Exam  Constitutional: She appears well-developed and well-nourished. No distress.  HENT:  Head: Normocephalic and atraumatic.  Mouth/Throat: Oropharynx is clear and moist.  Eyes: Conjunctivae are normal.  Neck: Normal range of motion.  Cardiovascular: Normal rate.   Pulmonary/Chest: Effort normal. No respiratory distress.  Genitourinary:  Genitourinary Comments: Female chaperone present for exam. Erythematous, macerated rash noted to right groin and labia extending into gluteal cleft.  Musculoskeletal: Normal range of motion.  Neurological: She is alert.  Skin: Skin is warm and dry.  Psychiatric: She has a normal mood and affect.  Nursing note and vitals reviewed.    ED Treatments / Results  Labs (all labs ordered are listed, but only abnormal results are displayed) Labs Reviewed - No data to display  EKG  EKG Interpretation None       Radiology No results found.  Procedures Procedures (including critical care time)  Medications Ordered in ED Medications - No data to display   Initial Impression / Assessment and Plan / ED Course  I have reviewed the triage vital signs and the nursing notes.  Pertinent labs & imaging results that were available during my care of the patient were reviewed by me and considered in my medical decision making (see chart for details).  Clinical Course    Patient is 82 yo F presenting with concern for possible hematuria, as well as worsening rash to her right groin and buttocks for the past 2 weeks. Urinalysis negative for hgb, but does show evidence of infection with large leukocytes. Patient is asymptomatic, and urine sent for culture, but will not start antibiotics given history of chronic cystitis. Rash noted to right groin and labia with clinical appearance of dermatitis, likely exacerbated by patient's atrophic vaginitis and urge incontinence. No evidence of yeast infection. Triple Paste barrier cream  provided, and advised patient to keep genital region clean and dry. Recommended follow up with PCP and urologist for reevaluation of urinalysis and rash. Stable for d/c home with return precautions to ED for new or worsening symptoms.  Final Clinical Impressions(s) / ED Diagnoses   Final diagnoses:  Contact dermatitis, unspecified contact dermatitis type, unspecified trigger  Chronic cystitis    New Prescriptions Discharge Medication List as of 05/17/2016 12:33 PM    START taking these medications   Details  Zinc Oxide (TRIPLE PASTE) 12.8 % ointment Apply 1 application topically as needed for irritation., Starting Tue 05/17/2016, Print         Adonica Fukushima F de McFarlandVillier II, [place] 05/17/16 1247    Geoffery Lyonsouglas Delo, MD 05/17/16 1320

## 2016-05-19 LAB — URINE CULTURE: Culture: 20000 — AB

## 2016-05-20 ENCOUNTER — Telehealth (HOSPITAL_COMMUNITY): Payer: Self-pay

## 2016-05-20 NOTE — Telephone Encounter (Signed)
Post ED Visit - Positive Culture Follow-up  Culture report reviewed by antimicrobial stewardship pharmacist:  []  Diamond [name], Pharm.D. []  Diamond [name], Pharm.D., BCPS []  Diamond [name], Pharm.D. []  Diamond [name], Pharm.D., BCPS []  Diamond [name], [place]., BCPS, AAHIVP []  Diamond [name], Pharm.D., BCPS, AAHIVP []  Diamond [name], Pharm.D. []  Diamond [name], [place]D. Diamond [name]  Diamond [name] , Pharm.D.   Positive urine culture, 20,000 colonies -> Enterococcus Faecalis  Chart reviewed by Diamond RutherfordShawn Joy PA "OK"  Diamond [name], Diamond [name] 05/20/2016, 11:30 AM

## 2016-05-20 NOTE — Progress Notes (Signed)
ED Antimicrobial Stewardship Positive Culture Follow Up   Diamond [name] is an 82 y.o. [name] who [facility] with a chief complaint of  Chief Complaint  Patient presents with  . Hematuria  . Rash    Recent Results (from the past 720 hour(s))  Urine culture     Status: Abnormal   Collection Time: 05/17/16 11:26 AM  Result Value Ref Range Status   Specimen Description URINE, RANDOM  Final   Special Requests NONE  Final   Culture 20,000 COLONIES/mL ENTEROCOCCUS FAECALIS (A)  Final   Report Status 05/19/2016 FINAL  Final   Organism ID, Bacteria ENTEROCOCCUS FAECALIS (A)  Final      Susceptibility   Enterococcus faecalis - MIC*    AMPICILLIN <=2 SENSITIVE Sensitive     LEVOFLOXACIN 1 SENSITIVE Sensitive     NITROFURANTOIN <=16 SENSITIVE Sensitive     VANCOMYCIN 2 SENSITIVE Sensitive     * 20,000 COLONIES/mL ENTEROCOCCUS FAECALIS   [x]  Patient discharged appropriately without antimicrobial agent. Treatment is NOT indicated given absence of urinary symptoms indicative of UTI. Additionally, patient is likely colonized given history of chronic cystitis.    ED Provider: Harolyn RutherfordShawn Joy, PA-C  Allena Katzaroline E Berlin Mokry, Pharm.D. PGY1 Pharmacy Resident 11/17/20179:09 AM Pager [phone] Phone# [phone]

## 2016-07-13 DIAGNOSIS — E039 Hypothyroidism, unspecified: Secondary | ICD-10-CM | POA: Diagnosis not present

## 2016-07-13 DIAGNOSIS — R739 Hyperglycemia, unspecified: Secondary | ICD-10-CM | POA: Diagnosis not present

## 2016-07-13 DIAGNOSIS — R413 Other amnesia: Secondary | ICD-10-CM | POA: Diagnosis not present

## 2016-07-13 DIAGNOSIS — D649 Anemia, unspecified: Secondary | ICD-10-CM | POA: Diagnosis not present

## 2016-07-13 DIAGNOSIS — E7800 Pure hypercholesterolemia, unspecified: Secondary | ICD-10-CM | POA: Diagnosis not present

## 2016-07-13 DIAGNOSIS — R634 Abnormal weight loss: Secondary | ICD-10-CM | POA: Diagnosis not present

## 2016-07-13 DIAGNOSIS — N183 Chronic kidney disease, stage 3 (moderate): Secondary | ICD-10-CM | POA: Diagnosis not present

## 2016-07-13 DIAGNOSIS — F329 Major depressive disorder, single episode, unspecified: Secondary | ICD-10-CM | POA: Diagnosis not present

## 2016-07-21 ENCOUNTER — Encounter (HOSPITAL_BASED_OUTPATIENT_CLINIC_OR_DEPARTMENT_OTHER): Payer: Self-pay | Admitting: *Deleted

## 2016-07-21 ENCOUNTER — Emergency Department (HOSPITAL_BASED_OUTPATIENT_CLINIC_OR_DEPARTMENT_OTHER): Payer: Medicare Other

## 2016-07-21 ENCOUNTER — Emergency Department (HOSPITAL_BASED_OUTPATIENT_CLINIC_OR_DEPARTMENT_OTHER)
Admission: EM | Admit: 2016-07-21 | Discharge: 2016-07-21 | Disposition: A | Discharge status: Home / Self Care | Payer: Medicare Other | Attending: Emergency Medicine | Admitting: Emergency Medicine

## 2016-07-21 DIAGNOSIS — I10 Essential (primary) hypertension: Secondary | ICD-10-CM | POA: Insufficient documentation

## 2016-07-21 DIAGNOSIS — M545 Low back pain, unspecified: Secondary | ICD-10-CM

## 2016-07-21 DIAGNOSIS — N390 Urinary tract infection, site not specified: Secondary | ICD-10-CM | POA: Insufficient documentation

## 2016-07-21 DIAGNOSIS — Z87891 Personal history of nicotine dependence: Secondary | ICD-10-CM | POA: Insufficient documentation

## 2016-07-21 DIAGNOSIS — E039 Hypothyroidism, unspecified: Secondary | ICD-10-CM | POA: Diagnosis not present

## 2016-07-21 LAB — URINALYSIS, ROUTINE W REFLEX MICROSCOPIC
Glucose, UA: NEGATIVE mg/dL
Hgb urine dipstick: NEGATIVE
KETONES UR: NEGATIVE mg/dL
NITRITE: NEGATIVE
PH: 6 (ref 5.0–8.0)
Protein, ur: NEGATIVE mg/dL
Specific Gravity, Urine: 1.022 (ref 1.005–1.030)

## 2016-07-21 LAB — URINALYSIS, MICROSCOPIC (REFLEX): RBC / HPF: NONE SEEN RBC/hpf (ref 0–5)

## 2016-07-21 MED ORDER — CIPROFLOXACIN HCL 500 MG PO TABS: 500.0000 mg | ORAL_TABLET | Freq: Two times a day (BID) | ORAL | 0 refills | Status: DC

## 2016-07-21 MED ORDER — ALBUTEROL SULFATE (2.5 MG/3ML) 0.083% IN NEBU: 2.5000 mg | INHALATION_SOLUTION | Freq: Once | RESPIRATORY_TRACT | Status: DC

## 2016-07-21 MED ORDER — IPRATROPIUM-ALBUTEROL 0.5-2.5 (3) MG/3ML IN SOLN: 3.0000 mL | Freq: Once | RESPIRATORY_TRACT | Status: DC

## 2016-07-21 MED ORDER — ACETAMINOPHEN 500 MG PO TABS
1000.0000 mg | ORAL_TABLET | Freq: Once | ORAL | Status: AC
  Administered 2016-07-21: 1000 mg via ORAL
  Filled 2016-07-21: qty 2

## 2016-07-21 MED ORDER — NYSTATIN-TRIAMCINOLONE 100000-0.1 UNIT/GM-% EX CREA: TOPICAL_CREAM | CUTANEOUS | 0 refills | Status: DC

## 2016-07-21 NOTE — ED Notes (Signed)
Pt refused XR; EDP notified.

## 2016-07-21 NOTE — ED Provider Notes (Signed)
[facility] Provider Note   CSN: [id number] Arrival date & time: 07/21/16  1708  By signing my name below, I, Doreatha MartinEva Mathews, attest that this documentation has been prepared under the direction and in the presence of Lavera Guiseana Duo Franki Stemen, MD. Electronically Signed: Doreatha MartinEva Mathews, ED Scribe. 07/21/16. 6:59 PM.     History   Chief Complaint Chief Complaint  Patient presents with  . Back Pain    HPI Diamond [name] with h/o chronic cystitis, atrophic vaginitis who presents to the Emergency Department complaining of moderate, worsening frequency of urination and dysuria that began more than one week ago with associated subjective fever. She also complains of labial irritation with her current symptoms. Per pt, she has used Estrace with no relief of irritation and subsequently diaper rash cream with moderate relief. She states she has not tried an antifungal cream for her irritation. Pt states her current symptoms are similar to prior to past UTIs. She denies vomiting, diarrhea, dysuria, abdominal pain. Per records, pt was last seen by her urologist on 04/08/16 for urge incontinence. At that time, she was using Mycolog cream PRN and had discontinued Estrace and VESIcare.   Pt also complains of lower back pain and right thigh that began yesterday. States she has been on feet more and bed she is sleeping on is uncomfortable. Pt states her back is only painful when walking. She denies recent fall. Pt states she has taken Aleve with no relief of pain. She also denies numbness, focal weakness of BLE, bowel or bladder incontinence. No abdominal pain, n/v/d.     The history is provided by the patient. No language interpreter was used.    Past Medical History:  Diagnosis Date  . Arthritis    "everywhere"  . Dementia   . High cholesterol   . History of blood transfusion    "related to my hip OR"  . History of gout   . Hypertension   . Hypothyroidism   . Migraine    "q  5-6 years" (06/06/2014)    Patient Active Problem List   Diagnosis Date Noted  . High cholesterol   . UTI (lower urinary tract infection)   . Anemia 06/06/2014  . E. coli UTI 06/06/2014  . Generalized weakness 06/06/2014  . Left humeral fracture 06/06/2014  . Depression 06/06/2014  . Hypothyroidism 06/06/2014  . Overactive bladder 06/06/2014  . Normocytic anemia 06/06/2014  . Frequent falls   . Bleeding gastrointestinal     Past Surgical History:  Procedure Laterality Date  . ABDOMINAL HYSTERECTOMY    . ANKLE FRACTURE SURGERY Right 1988   "crushed it"  . CHOLECYSTECTOMY  1970's  . DILATION AND CURETTAGE OF UTERUS    . FRACTURE SURGERY    . JOINT REPLACEMENT    . TOTAL HIP ARTHROPLASTY Right 1980's  . TUBAL LIGATION      OB History    No data available       Home Medications    Prior to Admission medications   Medication Sig Start Date End Date Taking? Authorizing Provider  citalopram (CELEXA) 20 MG tablet Take 20 mg by mouth daily.    Historical Provider, MD  iron polysaccharides (NIFEREX) 150 MG capsule Take 1 capsule (150 mg total) by mouth daily. 06/09/14   Rodolph Bonganiel Thompson V, MD  levothyroxine (SYNTHROID, LEVOTHROID) 50 MCG tablet Take 50 mcg by mouth daily before breakfast.    Historical Provider, MD  oxyCODONE-acetaminophen (PERCOCET/ROXICET) 5-325 MG per  tablet Take 1-2 tablets by mouth every 4 (four) hours as needed for severe pain. 06/09/14   Rodolph Bong, MD  pantoprazole (PROTONIX) 40 MG tablet Take 1 tablet (40 mg total) by mouth daily. 06/09/14   Rodolph Bong, MD  simvastatin (ZOCOR) 40 MG tablet Take 40 mg by mouth daily.    Historical Provider, MD  solifenacin (VESICARE) 5 MG tablet Take 5 mg by mouth daily.    Historical Provider, MD  Zinc Oxide (TRIPLE PASTE) 12.8 % ointment Apply 1 application topically as needed for irritation. 05/17/16   Daryl Vanita Ingles II, PA    Family History History reviewed. No pertinent family history.  Social  History Social History  Substance Use Topics  . Smoking status: Former Smoker    Packs/day: 2.00    Years: 40.00    Types: Cigarettes    Quit date: 05/05/1987  . Smokeless tobacco: Never Used  . Alcohol use 4.2 oz/week    7 Glasses of wine per week     Allergies   Codeine and Penicillins   Review of Systems Review of Systems 10/14 systems reviewed and are negative other than those stated in the HPI.    Physical Exam Updated Vital Signs BP 151/61 (BP Location: Right Arm)   Pulse 63   Temp 97.9 F (36.6 C) (Oral)   Resp 18   Ht 5' (1.524 m)   Wt 115 lb (52.2 kg)   SpO2 98%   BMI 22.46 kg/m   Physical Exam Physical Exam  Nursing note and vitals reviewed. Constitutional: Well developed, well nourished, non-toxic, and in no acute distress Head: Normocephalic and atraumatic.  Mouth/Throat: Oropharynx is clear and moist.  Neck: Normal range of motion. Neck supple.  Cardiovascular: Normal rate and regular rhythm.   Pulmonary/Chest: Effort normal and breath sounds normal.  Abdominal: Soft. There is no tenderness. There is no rebound and no guarding. No CVA tenderness.  GU: Evidence of atrophy of skin. There is satellite lesions noted around this area. Chaperone present throughout entire exam.   Musculoskeletal: Normal range of motion.  Neurological: Alert, no facial droop, fluent speech, moves all extremities symmetrically. Sensation to light touch intact BLE. Full strength in BLE.  Skin: Skin is warm and dry.  Psychiatric: Cooperative   ED Treatments / Results   DIAGNOSTIC STUDIES: Oxygen Saturation is 98% on RA, normal by my interpretation.    COORDINATION OF CARE: 6:42 PM Discussed treatment plan with pt at bedside which includes UA and pt agreed to plan.    Labs (all labs ordered are listed, but only abnormal results are displayed) Labs Reviewed - No data to display  EKG  EKG Interpretation None       Radiology No results  found.  Procedures Procedures (including critical care time)  Medications Ordered in ED Medications - No data to display   Initial Impression / Assessment and Plan / ED Course  I have reviewed the triage vital signs and the nursing notes.  Pertinent labs & imaging results that were available during my care of the patient were reviewed by me and considered in my medical decision making (see chart for details).     Records reviewed. [facility] with history of chronic recurrent cystitis and urge incontinence. UA today does show leukocytes and WBCs. Sent for culture. Treat with a course of antibiotics given that she does feel symptoms today, but referred back to her urologist regarding further management. No  CVA tenderness, abdominal pain, or systemic signs of illness.  Low back pain seems musculoskeletal in nature, only present with walking. Neurological exam is intact. No red flag features to suggest acute spine or spinal cord process, aside from her age. I did initially planned to perform x-ray of her low back given her age. However she refuses any imaging or workup for her back pain today and she now wants to have this evaluated by her orthopedist instead.  The patient appears reasonably screened and/or stabilized for discharge and I doubt any other medical condition or other [facility] requiring further screening, evaluation, or treatment in the ED at this time prior to discharge.  Strict return and follow-up instructions reviewed. She expressed understanding of all discharge instructions and felt comfortable with the plan of care.    Final Clinical Impressions(s) / ED Diagnoses   Final diagnoses:  None    New Prescriptions New Prescriptions   No medications on file    I personally performed the services described in this documentation, which was scribed in my presence. The recorded information has been reviewed and is accurate.    Lavera Guise,  MD 07/21/16 2015

## 2016-07-21 NOTE — ED Notes (Signed)
Patient transported to X-ray 

## 2016-07-21 NOTE — ED Notes (Signed)
Per [employer] pt is refusing to have XR.

## 2016-07-21 NOTE — Discharge Instructions (Signed)
Take antibiotics for potential UTI.  Return for worsening symptoms, including fever, abdominal pain, flank pain, confusion, vomiting or any other symptoms concerning to you. You are also given cream for potential yeast infection.  Follow-up with your urologist

## 2016-07-21 NOTE — ED Triage Notes (Signed)
Pt reports she was dx here last week with a uti, states she was not given any medicines and cont with back pain and "my privates are raw".

## 2016-07-23 LAB — URINE CULTURE

## 2017-01-24 DIAGNOSIS — R413 Other amnesia: Secondary | ICD-10-CM | POA: Diagnosis not present

## 2017-01-25 ENCOUNTER — Encounter: Payer: Self-pay | Admitting: Neurology

## 2017-03-15 DIAGNOSIS — N390 Urinary tract infection, site not specified: Secondary | ICD-10-CM | POA: Diagnosis not present

## 2017-03-16 DIAGNOSIS — Z79899 Other long term (current) drug therapy: Secondary | ICD-10-CM | POA: Diagnosis not present

## 2017-03-21 DIAGNOSIS — R32 Unspecified urinary incontinence: Secondary | ICD-10-CM | POA: Diagnosis not present

## 2017-03-21 DIAGNOSIS — E039 Hypothyroidism, unspecified: Secondary | ICD-10-CM | POA: Diagnosis not present

## 2017-03-21 DIAGNOSIS — F419 Anxiety disorder, unspecified: Secondary | ICD-10-CM | POA: Diagnosis not present

## 2017-03-21 DIAGNOSIS — Z87440 Personal history of urinary (tract) infections: Secondary | ICD-10-CM | POA: Diagnosis not present

## 2017-03-21 DIAGNOSIS — R350 Frequency of micturition: Secondary | ICD-10-CM | POA: Diagnosis not present

## 2017-03-21 DIAGNOSIS — F339 Major depressive disorder, recurrent, unspecified: Secondary | ICD-10-CM | POA: Diagnosis not present

## 2017-03-22 DIAGNOSIS — F411 Generalized anxiety disorder: Secondary | ICD-10-CM | POA: Diagnosis not present

## 2017-03-27 ENCOUNTER — Ambulatory Visit (INDEPENDENT_AMBULATORY_CARE_PROVIDER_SITE_OTHER): Payer: Medicare Other | Admitting: Neurology

## 2017-03-27 ENCOUNTER — Encounter: Payer: Self-pay | Admitting: Neurology

## 2017-03-27 ENCOUNTER — Ambulatory Visit: Payer: Medicare Other | Admitting: Neurology

## 2017-03-27 VITALS — BP 140/88 | HR 68 | Ht 60.0 in | Wt 110.0 lb

## 2017-03-27 DIAGNOSIS — F03A0 Unspecified dementia, mild, without behavioral disturbance, psychotic disturbance, mood disturbance, and anxiety: Secondary | ICD-10-CM

## 2017-03-27 DIAGNOSIS — F0390 Unspecified dementia without behavioral disturbance: Secondary | ICD-10-CM

## 2017-03-27 MED ORDER — DIVALPROEX SODIUM ER 250 MG PO TB24: ORAL_TABLET | ORAL | 6 refills | Status: DC

## 2017-03-27 NOTE — Patient Instructions (Signed)
1. Continue Donepezil  daily 2. Start Depakote ER  every night 3. Continue supervision at [facility], help with bathing and dressing as necessary 4. Follow-up in 6 months, call for any changes

## 2017-03-27 NOTE — Progress Notes (Signed)
NEUROLOGY CONSULTATION NOTE  Diamond [name] MRN: [id number] DOB: 09-Nov-1926  Referring provider: Jarrett Soho, PA-C Primary care provider: Jarrett Soho, PA-C  Reason for consult:  Memory changes  Thank you for your kind referral of Diamond [name] for consultation of the above symptoms. Although her history is well known to you, please allow me to reiterate it for the purpose of our medical record. The patient was accompanied to the clinic by her daughter Corrie Dandy, who also provides collateral information. Records and images were personally reviewed where available.  HISTORY OF PRESENT ILLNESS: This is a pleasant 83 year old right-handed woman with a history of hypertension, hyperlipidemia, hypothyroidism, gout, presenting for evaluation of worsening memory. She thinks her memory is pretty good. Her daughter started noticing memory changes since around January to March 2018, she would forget little things and was moving a little slower. Short-term memory was mostly affected, her long-term memory is excellent. She has trouble with dates, names, phone numbers. Her husband of 47 years was moved to a Memory Care facility at [facility] last July, then she moved to assisted living last month. She does not like it there, stating she cannot get information about her husband, "nobody knows anything." She reports that since she has been there, she has not had a discussion with any doctor about her husband, "they are disorganized." When she was living at home, her daughter feels she was not taking her medications as prescribed, finding full bottles of medications such as the Donepezil at home. Medications are now being administered by staff at [facility]. She denies any side effects on Aricept  daily. Her daughter reports that the house had been clean and laundry was always done, but reports concern that her hygiene is not good. She refuses help from staff with dressing and bathing. She fusses at  her husband and has become more agitated and irritable in the past 6-8 weeks. Her daughter is concerned about her negativity. They are also reporting visual hallucinations, she sees her husband in the room with her, even before moving to [place], while he was at rehab, she reported seeing him in the room. She is upset that someone was in her room at [facility] watching TV, they woke her up and cut her TV on. She asked who they were and they gave her a name, then she fell back to sleep. She states staff knocked on her door the next morning and gave her her key, but she says it is always on her neck. Last night she dreamed her husband and daughter were in the room with her. Her daughter started taking care of bills after the patient's husband moved to Memory Care. She stopped driving 3-5 years ago. She denies leaving the stove on, her daughter reports one time the crockpot was left on. She states she still plans to go back home and that she had help at home.   She denies any headaches, dizziness, diplopia, dysarthria, dysphagia, neck pain, focal numbness/tingling/weakness, bowel/bladder dysfunction. No anosmia, tremors, no recent falls. She denies any significant head injuries. No family history of dementia. She was in the ER after a fall in 06/2014 and had a head CT which I personally reviewed, no acute changes, there was diffuse atrophy and chronic microvascular disease.   PAST MEDICAL HISTORY: Past Medical History:  Diagnosis Date  . Arthritis    "everywhere"  . Dementia   . High cholesterol   . History of blood transfusion    "related to my  hip OR"  . History of gout   . Hypertension   . Hypothyroidism   . Migraine    "q 5-6 years" (06/06/2014)    PAST SURGICAL HISTORY: Past Surgical History:  Procedure Laterality Date  . ABDOMINAL HYSTERECTOMY    . ANKLE FRACTURE SURGERY Right 1988   "crushed it"  . CHOLECYSTECTOMY  1970's  . DILATION AND CURETTAGE OF UTERUS    . FRACTURE SURGERY      . JOINT REPLACEMENT    . TOTAL HIP ARTHROPLASTY Right 1980's  . TUBAL LIGATION      MEDICATIONS: Current Outpatient Prescriptions on File Prior to Visit  Medication Sig Dispense Refill  . ciprofloxacin (CIPRO) 500 MG tablet Take 1 tablet (500 mg total) by mouth every 12 (twelve) hours. 10 tablet 0  . citalopram (CELEXA) 20 MG tablet Take 20 mg by mouth daily.    . iron polysaccharides (NIFEREX) 150 MG capsule Take 1 capsule (150 mg total) by mouth daily. 30 capsule 0  . levothyroxine (SYNTHROID, LEVOTHROID) 50 MCG tablet Take 50 mcg by mouth daily before breakfast.    . nystatin-triamcinolone (MYCOLOG II) cream Apply to affected area daily 15 g 0  . oxyCODONE-acetaminophen (PERCOCET/ROXICET) 5-325 MG per tablet Take 1-2 tablets by mouth every 4 (four) hours as needed for severe pain. 20 tablet 0  . pantoprazole (PROTONIX) 40 MG tablet Take 1 tablet (40 mg total) by mouth daily. 30 tablet 0  . simvastatin (ZOCOR) 40 MG tablet Take 40 mg by mouth daily.    . solifenacin (VESICARE) 5 MG tablet Take 5 mg by mouth daily.    . Zinc Oxide (TRIPLE PASTE) 12.8 % ointment Apply 1 application topically as needed for irritation. 56.7 g 0   No current facility-administered medications on file prior to visit.     ALLERGIES: Allergies  Allergen Reactions  . Codeine Swelling    Lips  . Penicillins Swelling    Lips swelled with PCN; tolerating Cephalosporins 05/2014    FAMILY HISTORY: No family history on file.  SOCIAL HISTORY: Social History   Social History  . Marital status: Married    Spouse name: N/A  . Number of children: N/A  . Years of education: N/A   Occupational History  . Not on file.   Social History Main Topics  . Smoking status: Former Smoker    Packs/day: 2.00    Years: 40.00    Types: Cigarettes    Quit date: 05/05/1987  . Smokeless tobacco: Never Used  . Alcohol use 4.2 oz/week    7 Glasses of wine per week  . Drug use: No  . Sexual activity: Not Currently    Other Topics Concern  . Not on file   Social History Narrative  . No narrative on file    REVIEW OF SYSTEMS: Constitutional: No fevers, chills, or sweats, no generalized fatigue, change in appetite Eyes: No visual changes, double vision, eye pain Ear, nose and throat: No hearing loss, ear pain, nasal congestion, sore throat Cardiovascular: No chest pain, palpitations Respiratory:  No shortness of breath at rest or with exertion, wheezes GastrointestinaI: No nausea, vomiting, diarrhea, abdominal pain, fecal incontinence Genitourinary:  No dysuria, urinary retention or frequency Musculoskeletal:  No neck pain,+ back pain Integumentary: No rash, pruritus, skin lesions Neurological: as above Psychiatric: No depression, insomnia, anxiety Endocrine: No palpitations, fatigue, diaphoresis, mood swings, change in appetite, change in weight, increased thirst Hematologic/Lymphatic:  No anemia, purpura, petechiae. Allergic/Immunologic: no itchy/runny eyes, nasal congestion, recent allergic  reactions, rashes  PHYSICAL EXAM: Vitals:   03/27/17 1324  BP: 140/88  Pulse: 68  SpO2: 95%   General: No acute distress Head:  Normocephalic/atraumatic Eyes: Fundoscopic exam shows bilateral sharp discs, no vessel changes, exudates, or hemorrhages Neck: supple, no paraspinal tenderness, full range of motion Back: No paraspinal tenderness Heart: regular rate and rhythm Lungs: Clear to auscultation bilaterally. Vascular: No carotid bruits. Skin/Extremities: No rash, no edema Neurological Exam: Mental status: alert and oriented to person, place, and time, no dysarthria or aphasia, Fund of knowledge is appropriate.  Recent and remote memory are intact.  Attention and concentration are normal.    Able to name objects and repeat phrases.  Montreal Cognitive Assessment  03/27/2017  Visuospatial/ Executive (0/5) 1  Naming (0/3) 2  Attention: Read list of digits (0/2) 2  Attention: Read list of letters  (0/1) 0  Attention: Serial 7 subtraction starting at 100 (0/3) 3  Language: Repeat phrase (0/2) 2  Language : Fluency (0/1) 0  Abstraction (0/2) 1  Delayed Recall (0/5) 3  Orientation (0/6) 2  Total 16   Cranial nerves: CN I: not tested CN II: pupils equal, round and reactive to light, visual fields intact, fundi unremarkable. CN III, IV, VI:  full range of motion, no nystagmus, no ptosis CN V: facial sensation intact CN VII: upper and lower face symmetric CN VIII: hearing intact to finger rub CN IX, X: gag intact, uvula midline CN XI: sternocleidomastoid and trapezius muscles intact CN XII: tongue midline Bulk & Tone: normal, no fasciculations. Motor: 5/5 throughout with no pronator drift. Sensation: intact to light touch, cold, pin, vibration and joint position sense.  No extinction to double simultaneous stimulation.  Romberg test negative Deep Tendon Reflexes: +1 throughout, no ankle clonus Plantar responses: downgoing bilaterally Cerebellar: no incoordination on finger to nose, heel to shin. No dysdiadochokinesia Gait: slow and cautious with walker, no ataxia Tremor: none  IMPRESSION: This is an 83 year old right-handed woman with a history of hypertension, hyperlipidemia, hypothyroidism, gout, presenting for worsening memory. Her neurological exam is non-focal, MOCA score today 16/30. History suggestive of mild to moderate dementia with behavioral changes. Findings discussed with the patient and her daughter, continue Donepezil  daily. She is taking citalopram for mood and has been having difficulty adjusting to her new living situation. They are reporting visual hallucinations that keep her up at night, she will start low dose Depakote ER  qhs, side effects were discussed. We may either uptitrate as tolerated, versus consideration for Seroquel in the future. We discussed the need for higher level of care at assisted living. She does not drive. She will follow-up in 6  months and knows to call for any changes.   Thank you for allowing me to participate in the care of this patient. Please do not hesitate to call for any questions or concerns.   Patrcia Dolly, M.D.  CC: Jarrett Soho, PA-C

## 2017-03-29 ENCOUNTER — Emergency Department (HOSPITAL_BASED_OUTPATIENT_CLINIC_OR_DEPARTMENT_OTHER)
Admission: EM | Admit: 2017-03-29 | Discharge: 2017-03-29 | Disposition: A | Discharge status: Home / Self Care | Payer: Medicare Other | Attending: Emergency Medicine | Admitting: Emergency Medicine

## 2017-03-29 ENCOUNTER — Encounter (HOSPITAL_BASED_OUTPATIENT_CLINIC_OR_DEPARTMENT_OTHER): Payer: Self-pay | Admitting: *Deleted

## 2017-03-29 ENCOUNTER — Emergency Department (HOSPITAL_BASED_OUTPATIENT_CLINIC_OR_DEPARTMENT_OTHER): Payer: Medicare Other

## 2017-03-29 DIAGNOSIS — R030 Elevated blood-pressure reading, without diagnosis of hypertension: Secondary | ICD-10-CM | POA: Diagnosis not present

## 2017-03-29 DIAGNOSIS — R270 Ataxia, unspecified: Secondary | ICD-10-CM | POA: Insufficient documentation

## 2017-03-29 DIAGNOSIS — I10 Essential (primary) hypertension: Secondary | ICD-10-CM | POA: Insufficient documentation

## 2017-03-29 DIAGNOSIS — E039 Hypothyroidism, unspecified: Secondary | ICD-10-CM | POA: Insufficient documentation

## 2017-03-29 DIAGNOSIS — Z79899 Other long term (current) drug therapy: Secondary | ICD-10-CM | POA: Insufficient documentation

## 2017-03-29 DIAGNOSIS — F0390 Unspecified dementia without behavioral disturbance: Secondary | ICD-10-CM | POA: Insufficient documentation

## 2017-03-29 DIAGNOSIS — D649 Anemia, unspecified: Secondary | ICD-10-CM | POA: Diagnosis not present

## 2017-03-29 DIAGNOSIS — Z87891 Personal history of nicotine dependence: Secondary | ICD-10-CM | POA: Diagnosis not present

## 2017-03-29 DIAGNOSIS — R51 Headache: Secondary | ICD-10-CM | POA: Diagnosis present

## 2017-03-29 DIAGNOSIS — F411 Generalized anxiety disorder: Secondary | ICD-10-CM | POA: Diagnosis not present

## 2017-03-29 MED ORDER — ASPIRIN EC 81 MG PO TBEC: 81.0000 mg | DELAYED_RELEASE_TABLET | Freq: Once | ORAL | Status: DC

## 2017-03-29 MED ORDER — ASPIRIN 81 MG PO CHEW
81.0000 mg | CHEWABLE_TABLET | Freq: Once | ORAL | Status: AC
  Administered 2017-03-29: 81 mg via ORAL
  Filled 2017-03-29: qty 1

## 2017-03-29 MED ORDER — ASPIRIN EC 81 MG PO TBEC: 81.0000 mg | DELAYED_RELEASE_TABLET | Freq: Every day | ORAL | 0 refills | Status: DC

## 2017-03-29 NOTE — ED Triage Notes (Addendum)
Per EMS: pt from [facility]. Daughter reported pt was walking "with a lean". Neg stroke scale. Pt able to walk on scene. Neg orthostatics. Pt denies pain and has no c/o; VS per EMS: 166/76; HR 64; RR 18; 99% on RA; CBG 112

## 2017-03-29 NOTE — ED Notes (Signed)
Pt is alert and oriented, no distress noted.  2 daughters are at the bedside.

## 2017-03-29 NOTE — ED Notes (Signed)
Walked pt to the bathroom with walker pt ambulated well with a steady gate

## 2017-03-29 NOTE — ED Provider Notes (Signed)
[facility] Provider Note   CSN: [id number] Arrival date & time: 03/29/17  1816     History   Chief Complaint Chief Complaint  Patient presents with  . no c/o, daughter states was leaning to left side earlier    HPI Diamond [name] is a 83 y.o. female.  HPI  83 year old female presents after an episode of leaning to the right. History is taken from the daughter at the bedside. The patient has some early-onset dementia and does have some memory issues. Earlier tonight she is getting ready for dinner and seemed to be leaning towards the right. At first she had her left hip on the wall but was still leaning. While she was walking to dinner she seemed to be off balance but denied dizziness. Overall this leaning sensation lasts about 15 minutes. She is now back to normal. She denies ever having weakness in her upper or lower extremities. There is no headache currently. However family states she has complained on and off of a headache for the last 2 weeks. Walks with a walker.   Past Medical History:  Diagnosis Date  . Arthritis    "everywhere"  . Dementia   . High cholesterol   . History of blood transfusion    "related to my hip OR"  . History of gout   . Hypertension   . Hypothyroidism   . Migraine    "q 5-6 years" (06/06/2014)    Patient Active Problem List   Diagnosis Date Noted  . High cholesterol   . UTI (lower urinary tract infection)   . Anemia 06/06/2014  . E. coli UTI 06/06/2014  . Generalized weakness 06/06/2014  . Left humeral fracture 06/06/2014  . Depression 06/06/2014  . Hypothyroidism 06/06/2014  . Overactive bladder 06/06/2014  . Normocytic anemia 06/06/2014  . Frequent falls   . Bleeding gastrointestinal     Past Surgical History:  Procedure Laterality Date  . ABDOMINAL HYSTERECTOMY    . ANKLE FRACTURE SURGERY Right 1988   "crushed it"  . CHOLECYSTECTOMY  1970's  . DILATION AND CURETTAGE OF UTERUS    . FRACTURE SURGERY    . JOINT  REPLACEMENT    . TOTAL HIP ARTHROPLASTY Right 1980's  . TUBAL LIGATION      OB History    No data available       Home Medications    Prior to Admission medications   Medication Sig Start Date End Date Taking? Authorizing Provider  acetaminophen (TYLENOL) 650 MG CR tablet Take 650 mg by mouth every 8 (eight) hours as needed for pain.    [provider]  aspirin EC 81 MG tablet Take 1 tablet (81 mg total) by mouth daily. 03/29/17   Pricilla Loveless, MD  buPROPion (WELLBUTRIN) 75 MG tablet Take 37.5 mg by mouth 2 (two) times daily.    [provider]  CALCIUM PO Take by mouth.    [provider]  citalopram (CELEXA) 20 MG tablet Take 20 mg by mouth daily.    [provider]  divalproex (DEPAKOTE ER) 250 MG 24 hr tablet Take 1 tablet every night 03/27/17   Van Clines, MD  donepezil (ARICEPT) 10 MG tablet Take 10 mg by mouth at bedtime.    [provider]  ferrous sulfate 325 (65 FE) MG EC tablet Take 325 mg by mouth 3 (three) times daily with meals.    [provider]  hydrOXYzine (ATARAX/VISTARIL) 10 MG tablet Take 10 mg by  mouth 2 (two) times daily as needed.    [provider]  levothyroxine (SYNTHROID, LEVOTHROID) 50 MCG tablet Take 50 mcg by mouth daily before breakfast.    [provider]  MULTIPLE VITAMIN PO Take by mouth.    [provider]  pantoprazole (PROTONIX) 40 MG tablet Take 1 tablet (40 mg total) by mouth daily. 06/09/14   Rodolph Bong, MD  simvastatin (ZOCOR) 40 MG tablet Take 40 mg by mouth daily.    [provider]    Family History Family History  Problem Relation Age of Onset  . Heart attack Father     Social History Social History  Substance Use Topics  . Smoking status: Former Smoker    Packs/day: 2.00    Years: 40.00    Types: Cigarettes    Quit date: 05/05/1987  . Smokeless tobacco: Never Used  . Alcohol use 4.2 oz/week    7 Glasses of wine per week      Allergies   Codeine and Penicillins   Review of Systems Review of Systems  Unable to perform ROS: Dementia     Physical Exam Updated Vital Signs BP (!) 166/70 (BP Location: Right Arm)   Pulse 60   Temp 97.9 F (36.6 C) (Oral)   Resp 16   Ht 5' (1.524 m)   Wt 50.3 kg (111 lb)   SpO2 98%   BMI 21.68 kg/m   Physical Exam  Constitutional: She appears well-developed and well-nourished.  HENT:  Head: Normocephalic and atraumatic.  Right Ear: External ear normal.  Left Ear: External ear normal.  Nose: Nose normal.  Eyes: Right eye exhibits no discharge. Left eye exhibits no discharge.  Cardiovascular: Normal rate, regular rhythm and normal heart sounds.   Pulmonary/Chest: Effort normal and breath sounds normal.  Abdominal: Soft. There is no tenderness.  Neurological: She is alert.  Awake, alert, oriented to place, person, situation. CN 3-12 grossly intact. 5/5 strength in all 4 extremities. Grossly normal sensation. Normal finger to nose. Normal gait with walker.  Skin: Skin is warm and dry.  Nursing note and vitals reviewed.    ED Treatments / Results  Labs (all labs ordered are listed, but only abnormal results are displayed) Labs Reviewed - No data to display  EKG  EKG Interpretation None       Radiology Ct Head Wo Contrast  Result Date: 03/29/2017 CLINICAL DATA:  Ataxia EXAM: CT HEAD WITHOUT CONTRAST TECHNIQUE: Contiguous axial images were obtained from the base of the skull through the vertex without intravenous contrast. COMPARISON:  06/03/2014 FINDINGS: Brain: No acute territorial infarction, hemorrhage or intracranial mass is seen. Moderate small vessel ischemic changes of the white matter. 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 Hypodensity in the right basal ganglia suspected to represent old lacunar infarct or small vessel ischemic change. Mild to moderate atrophy. Stable ventricle size. Vascular: No hyperdense vessels.  Carotid artery  calcifications. Skull: No fracture or suspicious bone lesion Sinuses/Orbits: Mucosal thickening in the sphenoid and ethmoid sinuses. No acute orbital abnormality. Bilateral lens extraction Other: None IMPRESSION: No CT evidence for acute intracranial abnormality. Atrophy and small vessel ischemic changes of the white matter Electronically Signed   By: Jasmine Pang M.D.   On: 03/29/2017 19:41    Procedures Procedures (including critical care time)  Medications Ordered in ED Medications  aspirin chewable tablet 81 mg (81 mg Oral Given 03/29/17 1959)     Initial Impression / Assessment and Plan / ED Course  I have reviewed the triage vital signs and the nursing notes.  Pertinent labs & imaging results that were available during my care of the patient were reviewed by me and considered in my medical decision making (see chart for details).     Patient is asymptomatic, benign neuro exam (except some dementia) and good gait. The leaning could have been truncal ataxia/acute TIA. Discussed this with patient/family. Given no symptoms, they do not want to pursue admission/workup of TIA and/or causes. State they wouldn't want significant interventions (carotid surgery, etc). Thus with shared decision making we did head CT given headache x 2 weeks and will start on baby ASA. Still f/u with PCP for possible outpatient workup. They understand that by delaying workup she could develop a stroke that could have been prevented. However given age and comorbidities I think d/c home to comfortable environment is reasonable. Discussed return precautions.   Final Clinical Impressions(s) / ED Diagnoses   Final diagnoses:  Ataxia    New Prescriptions Discharge Medication List as of 03/29/2017  7:53 PM    START taking these medications   Details  aspirin EC 81 MG tablet Take 1 tablet (81 mg total) by mouth daily., Starting Wed 03/29/2017, Print         Pricilla Loveless, MD 03/30/17 0040

## 2017-03-29 NOTE — ED Triage Notes (Signed)
Pt to room 4 by ems. Pt is a/a/ox4, states "I am fine. There is nothing wrong with me, my daughter got overly excited when I leaned to my left side while I was getting dressed." pt denies any c/o, speech is clear, smile symmetrical, denies any pain or any c/o. "I am fine, she just gets excited and goes off the deep end sometimes. It's irritating."

## 2017-03-30 DIAGNOSIS — Z79899 Other long term (current) drug therapy: Secondary | ICD-10-CM | POA: Diagnosis not present

## 2017-04-05 DIAGNOSIS — F411 Generalized anxiety disorder: Secondary | ICD-10-CM | POA: Diagnosis not present

## 2017-04-12 DIAGNOSIS — F411 Generalized anxiety disorder: Secondary | ICD-10-CM | POA: Diagnosis not present

## 2017-04-19 ENCOUNTER — Emergency Department (HOSPITAL_COMMUNITY): Payer: Medicare Other

## 2017-04-19 ENCOUNTER — Emergency Department (HOSPITAL_COMMUNITY)
Admission: EM | Admit: 2017-04-19 | Discharge: 2017-04-19 | Disposition: A | Discharge status: Home / Self Care | Payer: Medicare Other | Attending: Emergency Medicine | Admitting: Emergency Medicine

## 2017-04-19 ENCOUNTER — Encounter (HOSPITAL_COMMUNITY): Payer: Self-pay | Admitting: Emergency Medicine

## 2017-04-19 DIAGNOSIS — R531 Weakness: Secondary | ICD-10-CM | POA: Diagnosis not present

## 2017-04-19 DIAGNOSIS — F0390 Unspecified dementia without behavioral disturbance: Secondary | ICD-10-CM | POA: Diagnosis not present

## 2017-04-19 DIAGNOSIS — N3000 Acute cystitis without hematuria: Secondary | ICD-10-CM | POA: Insufficient documentation

## 2017-04-19 DIAGNOSIS — Z7982 Long term (current) use of aspirin: Secondary | ICD-10-CM | POA: Diagnosis not present

## 2017-04-19 DIAGNOSIS — R269 Unspecified abnormalities of gait and mobility: Secondary | ICD-10-CM | POA: Diagnosis not present

## 2017-04-19 DIAGNOSIS — R030 Elevated blood-pressure reading, without diagnosis of hypertension: Secondary | ICD-10-CM | POA: Diagnosis not present

## 2017-04-19 DIAGNOSIS — E039 Hypothyroidism, unspecified: Secondary | ICD-10-CM | POA: Insufficient documentation

## 2017-04-19 DIAGNOSIS — Z87891 Personal history of nicotine dependence: Secondary | ICD-10-CM | POA: Insufficient documentation

## 2017-04-19 DIAGNOSIS — I10 Essential (primary) hypertension: Secondary | ICD-10-CM | POA: Diagnosis not present

## 2017-04-19 DIAGNOSIS — R262 Difficulty in walking, not elsewhere classified: Secondary | ICD-10-CM | POA: Diagnosis present

## 2017-04-19 DIAGNOSIS — Z96641 Presence of right artificial hip joint: Secondary | ICD-10-CM | POA: Insufficient documentation

## 2017-04-19 DIAGNOSIS — Z79899 Other long term (current) drug therapy: Secondary | ICD-10-CM | POA: Diagnosis not present

## 2017-04-19 DIAGNOSIS — N390 Urinary tract infection, site not specified: Secondary | ICD-10-CM | POA: Diagnosis not present

## 2017-04-19 DIAGNOSIS — G8911 Acute pain due to trauma: Secondary | ICD-10-CM | POA: Diagnosis not present

## 2017-04-19 LAB — URINALYSIS, ROUTINE W REFLEX MICROSCOPIC
Bilirubin Urine: NEGATIVE
GLUCOSE, UA: NEGATIVE mg/dL
HGB URINE DIPSTICK: NEGATIVE
Ketones, ur: NEGATIVE mg/dL
NITRITE: NEGATIVE
PH: 7 (ref 5.0–8.0)
PROTEIN: NEGATIVE mg/dL
SPECIFIC GRAVITY, URINE: 1.003 — AB (ref 1.005–1.030)

## 2017-04-19 LAB — I-STAT CHEM 8, ED
BUN: 21 mg/dL — ABNORMAL HIGH (ref 6–20)
CALCIUM ION: 1.13 mmol/L — AB (ref 1.15–1.40)
CHLORIDE: 98 mmol/L — AB (ref 101–111)
Creatinine, Ser: 1.5 mg/dL — ABNORMAL HIGH (ref 0.44–1.00)
GLUCOSE: 93 mg/dL (ref 65–99)
HCT: 32 % — ABNORMAL LOW (ref 36.0–46.0)
HEMOGLOBIN: 10.9 g/dL — AB (ref 12.0–15.0)
Potassium: 3.9 mmol/L (ref 3.5–5.1)
SODIUM: 139 mmol/L (ref 135–145)
TCO2: 29 mmol/L (ref 22–32)

## 2017-04-19 LAB — I-STAT TROPONIN, ED: Troponin i, poc: 0 ng/mL (ref 0.00–0.08)

## 2017-04-19 LAB — CBG MONITORING, ED: GLUCOSE-CAPILLARY: 95 mg/dL (ref 65–99)

## 2017-04-19 MED ORDER — VALPROATE SODIUM 500 MG/5ML IV SOLN
500.0000 mg | Freq: Once | INTRAVENOUS | Status: DC
  Filled 2017-04-19: qty 5

## 2017-04-19 MED ORDER — NITROFURANTOIN MONOHYD MACRO 100 MG PO CAPS
100.0000 mg | ORAL_CAPSULE | Freq: Once | ORAL | Status: AC
  Administered 2017-04-19: 100 mg via ORAL
  Filled 2017-04-19: qty 1

## 2017-04-19 MED ORDER — NITROFURANTOIN MONOHYD MACRO 100 MG PO CAPS: 100.0000 mg | ORAL_CAPSULE | Freq: Two times a day (BID) | ORAL | 0 refills | Status: DC

## 2017-04-19 NOTE — Discharge Instructions (Signed)
Macrobid for 5 days, twice per day for urinary tract infection

## 2017-04-19 NOTE — ED Notes (Signed)
Pt able to ambulate to the bathroom with a steady gait.

## 2017-04-19 NOTE — ED Triage Notes (Signed)
Per EMS:  Pt was seen by staff at facility swaying and having difficult ambulating in hall, a left sided lean, and left sided weakness upon assessment.  When EMS arrived all had resolved, no neuro deficits noted with EMS.  Pt axo.  Pt ambulated independently.  Complaint free at this time.  Hypertensive upon EMS arrival (180/90).

## 2017-04-19 NOTE — ED Notes (Signed)
[facility] here for pt. Pt being taken back to [facility].

## 2017-04-20 NOTE — ED Provider Notes (Signed)
[facility] Provider Note   CSN: [id number] Arrival date & time: 04/19/17  1846     History   Chief Complaint Chief Complaint  Patient presents with  . Weakness    HPI Rosemaria A Milinda CaveDesanto is a 83 y.o. female. Chief complaint is "I was swerving with my walker".  HPI: 83 year old female. She is at a assisted living facility. She states she was walking down the hallway and she states she started "swervingto the left".  She states that "they all panicked". She was brought to the emergency room out of concern of the staff. She states that "they had been using my arms and legs and smiling and I could do all that stuff normally".   States she feels fine and normal now. She has had some urinary frequency but denies any other symptoms.  Past Medical History:  Diagnosis Date  . Arthritis    "everywhere"  . Dementia   . High cholesterol   . History of blood transfusion    "related to my hip OR"  . History of gout   . Hypertension   . Hypothyroidism   . Migraine    "q 5-6 years" (06/06/2014)    Patient Active Problem List   Diagnosis Date Noted  . High cholesterol   . UTI (lower urinary tract infection)   . Anemia 06/06/2014  . E. coli UTI 06/06/2014  . Generalized weakness 06/06/2014  . Left humeral fracture 06/06/2014  . Depression 06/06/2014  . Hypothyroidism 06/06/2014  . Overactive bladder 06/06/2014  . Normocytic anemia 06/06/2014  . Frequent falls   . Bleeding gastrointestinal     Past Surgical History:  Procedure Laterality Date  . ABDOMINAL HYSTERECTOMY    . ANKLE FRACTURE SURGERY Right 1988   "crushed it"  . CHOLECYSTECTOMY  1970's  . DILATION AND CURETTAGE OF UTERUS    . FRACTURE SURGERY    . JOINT REPLACEMENT    . TOTAL HIP ARTHROPLASTY Right 1980's  . TUBAL LIGATION      OB History    No data available       Home Medications    Prior to Admission medications   Medication Sig Start Date End Date Taking?  Authorizing Provider  acetaminophen (TYLENOL) 650 MG CR tablet Take 650 mg by mouth every 8 (eight) hours as needed for pain.   Yes [provider]  aspirin EC 81 MG tablet Take 1 tablet (81 mg total) by mouth daily. 03/29/17  Yes Pricilla LovelessGoldston, Scott, MD  buPROPion (WELLBUTRIN) 75 MG tablet Take 37.5 mg by mouth 2 (two) times daily.   Yes [provider]  calcium-vitamin D (OSCAL WITH D) 500-200 MG-UNIT tablet Take 1 tablet by mouth 2 (two) times daily.   Yes [provider]  citalopram (CELEXA) 20 MG tablet Take 20 mg by mouth daily.   Yes [provider]  divalproex (DEPAKOTE ER) 250 MG 24 hr tablet Take 1 tablet every night 03/27/17  Yes Van ClinesAquino, Karen M, MD  donepezil (ARICEPT) 10 MG tablet Take 10 mg by mouth at bedtime.   Yes [provider]  hydrOXYzine (ATARAX/VISTARIL) 10 MG tablet Take 10 mg by mouth 2 (two) times daily as needed.   Yes [provider]  iron polysaccharides (NIFEREX) 150 MG capsule Take 150 mg by mouth daily.   Yes [provider]  levothyroxine (SYNTHROID, LEVOTHROID) 50 MCG tablet Take 50 mcg by mouth daily before breakfast.   Yes [provider]  MULTIPLE  VITAMIN PO Take by mouth.   Yes [provider]  pantoprazole (PROTONIX) 40 MG tablet Take 1 tablet (40 mg total) by mouth daily. 06/09/14  Yes Rodolph Bong, MD  simvastatin (ZOCOR) 40 MG tablet Take 40 mg by mouth at bedtime.    Yes [provider]  nitrofurantoin, macrocrystal-monohydrate, (MACROBID) 100 MG capsule Take 1 capsule (100 mg total) by mouth 2 (two) times daily. 04/19/17   Rolland Porter, MD    Family History Family History  Problem Relation Age of Onset  . Heart attack Father     Social History Social History  Substance Use Topics  . Smoking status: Former Smoker    Packs/day: 2.00    Years: 40.00    Types: Cigarettes    Quit date: 05/05/1987  . Smokeless tobacco: Never Used  . Alcohol use 4.2 oz/week    7  Glasses of wine per week     Allergies   Codeine and Penicillins   Review of Systems Review of Systems  Constitutional: Negative for appetite change, chills, diaphoresis, fatigue and fever.  HENT: Negative for mouth sores, sore throat and trouble swallowing.   Eyes: Negative for visual disturbance.  Respiratory: Negative for cough, chest tightness, shortness of breath and wheezing.   Cardiovascular: Negative for chest pain.  Gastrointestinal: Negative for abdominal distention, abdominal pain, diarrhea, nausea and vomiting.  Endocrine: Negative for polydipsia, polyphagia and polyuria.  Genitourinary: Positive for frequency. Negative for dysuria and hematuria.  Musculoskeletal: Negative for gait problem.  Skin: Negative for color change, pallor and rash.  Neurological: Negative for dizziness, syncope, light-headedness and headaches.  Hematological: Does not bruise/bleed easily.  Psychiatric/Behavioral: Negative for behavioral problems and confusion.     Physical Exam Updated Vital Signs BP (!) 188/72 (BP Location: Right Arm)   Pulse (!) 53   Resp (!) 22   SpO2 100%   Physical Exam  Constitutional: She is oriented to person, place, and time. She appears well-developed and well-nourished. No distress.  HENT:  Head: Normocephalic.  Eyes: Pupils are equal, round, and reactive to light. Conjunctivae are normal. No scleral icterus.  Neck: Normal range of motion. Neck supple. No thyromegaly present.  Cardiovascular: Normal rate and regular rhythm.  Exam reveals no gallop and no friction rub.   No murmur heard. Pulmonary/Chest: Effort normal and breath sounds normal. No respiratory distress. She has no wheezes. She has no rales.  Abdominal: Soft. Bowel sounds are normal. She exhibits no distension. There is no tenderness. There is no rebound.  Musculoskeletal: Normal range of motion.  Neurological: She is alert and oriented to person, place, and time.  Skin: Skin is warm and dry.  No rash noted.  Psychiatric: She has a normal mood and affect. Her behavior is normal.     ED Treatments / Results  Labs (all labs ordered are listed, but only abnormal results are displayed) Labs Reviewed  URINALYSIS, ROUTINE W REFLEX MICROSCOPIC - Abnormal; Notable for the following:       Result Value   Color, Urine STRAW (*)    APPearance HAZY (*)    Specific Gravity, Urine 1.003 (*)    Leukocytes, UA LARGE (*)    Bacteria, UA MANY (*)    Squamous Epithelial / LPF 0-5 (*)    Non Squamous Epithelial 0-5 (*)    All other components within normal limits  I-STAT CHEM 8, ED - Abnormal; Notable for the following:    Chloride 98 (*)    BUN 21 (*)  Creatinine, Ser 1.50 (*)    Calcium, Ion 1.13 (*)    Hemoglobin 10.9 (*)    HCT 32.0 (*)    All other components within normal limits  I-STAT TROPONIN, ED  CBG MONITORING, ED    EKG  EKG Interpretation None       Radiology Ct Head Wo Contrast  Result Date: 04/19/2017 CLINICAL DATA:  LEFT-sided weakness, gait instability. History of hypertension and dementia. EXAM: CT HEAD WITHOUT CONTRAST TECHNIQUE: Contiguous axial images were obtained from the base of the skull through the vertex without intravenous contrast. COMPARISON:  CT HEAD March 29, 2017 FINDINGS: BRAIN: No intraparenchymal hemorrhage, mass effect nor midline shift. The ventricles and sulci are normal for age. Patchy to confluent supratentorial white matter hypodensities. No acute large vascular territory infarcts. No abnormal extra-axial fluid collections. Basal cisterns are patent. VASCULAR: Moderate to severe calcific atherosclerosis of the carotid siphons. SKULL: No skull fracture. No significant scalp soft tissue swelling. SINUSES/ORBITS: Mild paranasal sinus mucosal thickening. Mastoid air cells are well aerated. The included ocular globes and orbital contents are non-suspicious. Status post bilateral ocular lens implants. Stepped OTHER: None. IMPRESSION: 1. No  acute intracranial process. 2. Stable examination including moderate to severe chronic small vessel ischemic disease. Electronically Signed   By: Awilda Metro M.D.   On: 04/19/2017 19:16    Procedures Procedures (including critical care time)  Medications Ordered in ED Medications  nitrofurantoin (macrocrystal-monohydrate) (MACROBID) capsule 100 mg (100 mg Oral Given 04/19/17 2224)     Initial Impression / Assessment and Plan / ED Course  I have reviewed the triage vital signs and the nursing notes.  Pertinent labs & imaging results that were available during my care of the patient were reviewed by me and considered in my medical decision making (see chart for details).    As I walk in the room patient ovary exaggerates his smile, shows me normal use of her chest remedies. Her neurological exam is normal. CT scan does not show acute process. Her urine does appear infected. I'm not concerned this represents TIA or CVA at this point I think she is appropriate for treatment for her cystitis and discharged back to her care facility.  Final Clinical Impressions(s) / ED Diagnoses   Final diagnoses:  Acute cystitis without hematuria    New Prescriptions Discharge Medication List as of 04/19/2017  9:33 PM    START taking these medications   Details  nitrofurantoin, macrocrystal-monohydrate, (MACROBID) 100 MG capsule Take 1 capsule (100 mg total) by mouth 2 (two) times daily., Starting Wed 04/19/2017, Print         Rolland Porter, MD 04/20/17 [phone]

## 2017-04-25 DIAGNOSIS — R350 Frequency of micturition: Secondary | ICD-10-CM | POA: Diagnosis not present

## 2017-04-25 DIAGNOSIS — R32 Unspecified urinary incontinence: Secondary | ICD-10-CM | POA: Diagnosis not present

## 2017-04-25 DIAGNOSIS — E039 Hypothyroidism, unspecified: Secondary | ICD-10-CM | POA: Diagnosis not present

## 2017-04-25 DIAGNOSIS — F339 Major depressive disorder, recurrent, unspecified: Secondary | ICD-10-CM | POA: Diagnosis not present

## 2017-04-25 DIAGNOSIS — F419 Anxiety disorder, unspecified: Secondary | ICD-10-CM | POA: Diagnosis not present

## 2017-04-25 DIAGNOSIS — Z87440 Personal history of urinary (tract) infections: Secondary | ICD-10-CM | POA: Diagnosis not present

## 2017-04-26 DIAGNOSIS — F411 Generalized anxiety disorder: Secondary | ICD-10-CM | POA: Diagnosis not present

## 2017-04-27 DIAGNOSIS — F0391 Unspecified dementia with behavioral disturbance: Secondary | ICD-10-CM | POA: Diagnosis not present

## 2017-04-27 DIAGNOSIS — F325 Major depressive disorder, single episode, in full remission: Secondary | ICD-10-CM | POA: Diagnosis not present

## 2017-05-02 DIAGNOSIS — F419 Anxiety disorder, unspecified: Secondary | ICD-10-CM | POA: Diagnosis not present

## 2017-05-02 DIAGNOSIS — F325 Major depressive disorder, single episode, in full remission: Secondary | ICD-10-CM | POA: Diagnosis not present

## 2017-05-02 DIAGNOSIS — F339 Major depressive disorder, recurrent, unspecified: Secondary | ICD-10-CM | POA: Diagnosis not present

## 2017-05-02 DIAGNOSIS — F4321 Adjustment disorder with depressed mood: Secondary | ICD-10-CM | POA: Diagnosis not present

## 2017-05-02 DIAGNOSIS — F411 Generalized anxiety disorder: Secondary | ICD-10-CM | POA: Diagnosis not present

## 2017-05-02 DIAGNOSIS — F0391 Unspecified dementia with behavioral disturbance: Secondary | ICD-10-CM | POA: Diagnosis not present

## 2017-05-03 DIAGNOSIS — F331 Major depressive disorder, recurrent, moderate: Secondary | ICD-10-CM | POA: Diagnosis not present

## 2017-05-03 DIAGNOSIS — F411 Generalized anxiety disorder: Secondary | ICD-10-CM | POA: Diagnosis not present

## 2017-05-04 DIAGNOSIS — M25552 Pain in left hip: Secondary | ICD-10-CM | POA: Diagnosis not present

## 2017-05-04 DIAGNOSIS — M25551 Pain in right hip: Secondary | ICD-10-CM | POA: Diagnosis not present

## 2017-05-09 DIAGNOSIS — M1990 Unspecified osteoarthritis, unspecified site: Secondary | ICD-10-CM | POA: Diagnosis not present

## 2017-05-09 DIAGNOSIS — R6889 Other general symptoms and signs: Secondary | ICD-10-CM | POA: Diagnosis not present

## 2017-05-09 DIAGNOSIS — I10 Essential (primary) hypertension: Secondary | ICD-10-CM | POA: Diagnosis not present

## 2017-05-09 DIAGNOSIS — F3289 Other specified depressive episodes: Secondary | ICD-10-CM | POA: Diagnosis not present

## 2017-05-09 DIAGNOSIS — M25551 Pain in right hip: Secondary | ICD-10-CM | POA: Diagnosis not present

## 2017-05-09 DIAGNOSIS — F418 Other specified anxiety disorders: Secondary | ICD-10-CM | POA: Diagnosis not present

## 2017-05-10 DIAGNOSIS — Z79899 Other long term (current) drug therapy: Secondary | ICD-10-CM | POA: Diagnosis not present

## 2017-05-10 DIAGNOSIS — F331 Major depressive disorder, recurrent, moderate: Secondary | ICD-10-CM | POA: Diagnosis not present

## 2017-05-10 DIAGNOSIS — F411 Generalized anxiety disorder: Secondary | ICD-10-CM | POA: Diagnosis not present

## 2017-05-12 DIAGNOSIS — Z79899 Other long term (current) drug therapy: Secondary | ICD-10-CM | POA: Diagnosis not present

## 2017-05-17 DIAGNOSIS — F331 Major depressive disorder, recurrent, moderate: Secondary | ICD-10-CM | POA: Diagnosis not present

## 2017-05-17 DIAGNOSIS — F411 Generalized anxiety disorder: Secondary | ICD-10-CM | POA: Diagnosis not present

## 2017-05-18 ENCOUNTER — Ambulatory Visit (INDEPENDENT_AMBULATORY_CARE_PROVIDER_SITE_OTHER): Payer: Medicare Other | Admitting: Family

## 2017-05-18 ENCOUNTER — Encounter (INDEPENDENT_AMBULATORY_CARE_PROVIDER_SITE_OTHER): Payer: Self-pay | Admitting: Family

## 2017-05-18 ENCOUNTER — Ambulatory Visit (INDEPENDENT_AMBULATORY_CARE_PROVIDER_SITE_OTHER): Payer: Medicare Other

## 2017-05-18 VITALS — Ht 60.0 in | Wt 111.0 lb

## 2017-05-18 DIAGNOSIS — M25551 Pain in right hip: Secondary | ICD-10-CM | POA: Diagnosis not present

## 2017-05-18 NOTE — Progress Notes (Signed)
Office Visit Note   Patient: Diamond [name]           Date of Birth: 1926/07/23           MRN: [id number] Visit Date: 05/18/2017              Requested by: Jarrett SohoWharton, Courtney, PA-C [address] [place], [place] [address] PCP: Jarrett SohoWharton, Courtney, PA-C  No chief complaint on file.     HPI: Patient is a 83 year old woman who presents complaining of right hip pain in the groin.  Patient states she is status post a fall with pain in her upper and lower extremities as well as right hip pain.  Patient denies any radicular symptoms.  Assessment & Plan: Visit Diagnoses:  1. Pain in right hip     Plan: Recommended walking with her walker twice a day for exercise.  Discussed the importance of exercise.  Follow-Up Instructions: Return if symptoms worsen or fail to improve.   Ortho Exam  Patient is alert, oriented, no adenopathy, well-dressed, normal affect, normal respiratory effort. Examination patient can ambulate pain-free with her walker.  There is no pain with range of motion of the hip knee or ankle bilaterally.  There is a negative straight leg raise no focal motor weakness.  Imaging: Xr Hip Unilat W Or W/o Pelvis 2-3 Views Right  Result Date: 05/18/2017 2 view radiographs of the right hip shows a stable total hip arthroplasty no lucency no signs of dislocation no sign of fracture.  No images are attached to the encounter.  Labs: Lab Results  Component Value Date   REPTSTATUS 07/23/2016 FINAL 07/21/2016   CULT MULTIPLE SPECIES PRESENT, SUGGEST RECOLLECTION (A) 07/21/2016   LABORGA ENTEROCOCCUS FAECALIS (A) 05/17/2016    Orders:  Orders Placed This Encounter  Procedures  . XR HIP UNILAT W OR W/O PELVIS 2-3 VIEWS RIGHT   No orders of the defined types were placed in this encounter.    Procedures: No procedures performed  Clinical Data: No additional findings.  ROS:  All other systems negative, except as noted in the HPI. Review of  Systems  Objective: Vital Signs: Ht 5' (1.524 m)   Wt 111 lb (50.3 kg)   BMI 21.68 kg/m   Specialty Comments:  No specialty comments available.  PMFS History: Patient Active Problem List   Diagnosis Date Noted  . High cholesterol   . UTI (lower urinary tract infection)   . Anemia 06/06/2014  . E. coli UTI 06/06/2014  . Generalized weakness 06/06/2014  . Left humeral fracture 06/06/2014  . Depression 06/06/2014  . Hypothyroidism 06/06/2014  . Overactive bladder 06/06/2014  . Normocytic anemia 06/06/2014  . Frequent falls   . Bleeding gastrointestinal    Past Medical History:  Diagnosis Date  . Arthritis    "everywhere"  . Dementia   . High cholesterol   . History of blood transfusion    "related to my hip OR"  . History of gout   . Hypertension   . Hypothyroidism   . Migraine    "q 5-6 years" (06/06/2014)    Family History  Problem Relation Age of Onset  . Heart attack Father     Past Surgical History:  Procedure Laterality Date  . ABDOMINAL HYSTERECTOMY    . ANKLE FRACTURE SURGERY Right 1988   "crushed it"  . CHOLECYSTECTOMY  1970's  . DILATION AND CURETTAGE OF UTERUS    . FRACTURE SURGERY    . JOINT REPLACEMENT    .  TOTAL HIP ARTHROPLASTY Right 1980's  . TUBAL LIGATION     Social History   Occupational History  . Not on file  Tobacco Use  . Smoking status: Former Smoker    Packs/day: 2.00    Years: 40.00    Pack years: 80.00    Types: Cigarettes    Last attempt to quit: 05/05/1987    Years since quitting: 30.0  . Smokeless tobacco: Never Used  Substance and Sexual Activity  . Alcohol use: Yes    Alcohol/week: 4.2 oz    Types: 7 Glasses of wine per week  . Drug use: No  . Sexual activity: Not Currently

## 2017-05-23 DIAGNOSIS — R296 Repeated falls: Secondary | ICD-10-CM | POA: Diagnosis not present

## 2017-05-23 DIAGNOSIS — F3289 Other specified depressive episodes: Secondary | ICD-10-CM | POA: Diagnosis not present

## 2017-05-23 DIAGNOSIS — R2689 Other abnormalities of gait and mobility: Secondary | ICD-10-CM | POA: Diagnosis not present

## 2017-05-23 DIAGNOSIS — G308 Other Alzheimer's disease: Secondary | ICD-10-CM | POA: Diagnosis not present

## 2017-05-23 DIAGNOSIS — Z79899 Other long term (current) drug therapy: Secondary | ICD-10-CM | POA: Diagnosis not present

## 2017-05-23 DIAGNOSIS — I10 Essential (primary) hypertension: Secondary | ICD-10-CM | POA: Diagnosis not present

## 2017-05-23 DIAGNOSIS — R531 Weakness: Secondary | ICD-10-CM | POA: Diagnosis not present

## 2017-05-29 DIAGNOSIS — Z720 Tobacco use: Secondary | ICD-10-CM | POA: Diagnosis not present

## 2017-05-29 DIAGNOSIS — F0390 Unspecified dementia without behavioral disturbance: Secondary | ICD-10-CM | POA: Diagnosis not present

## 2017-05-29 DIAGNOSIS — F329 Major depressive disorder, single episode, unspecified: Secondary | ICD-10-CM | POA: Diagnosis not present

## 2017-05-29 DIAGNOSIS — R29898 Other symptoms and signs involving the musculoskeletal system: Secondary | ICD-10-CM | POA: Diagnosis not present

## 2017-05-29 DIAGNOSIS — Z8781 Personal history of (healed) traumatic fracture: Secondary | ICD-10-CM | POA: Diagnosis not present

## 2017-05-29 DIAGNOSIS — N183 Chronic kidney disease, stage 3 (moderate): Secondary | ICD-10-CM | POA: Diagnosis not present

## 2017-05-29 DIAGNOSIS — Z7982 Long term (current) use of aspirin: Secondary | ICD-10-CM | POA: Diagnosis not present

## 2017-05-29 DIAGNOSIS — Z8673 Personal history of transient ischemic attack (TIA), and cerebral infarction without residual deficits: Secondary | ICD-10-CM | POA: Diagnosis not present

## 2017-05-29 DIAGNOSIS — I129 Hypertensive chronic kidney disease with stage 1 through stage 4 chronic kidney disease, or unspecified chronic kidney disease: Secondary | ICD-10-CM | POA: Diagnosis not present

## 2017-05-29 DIAGNOSIS — R2689 Other abnormalities of gait and mobility: Secondary | ICD-10-CM | POA: Diagnosis not present

## 2017-05-29 DIAGNOSIS — Z9181 History of falling: Secondary | ICD-10-CM | POA: Diagnosis not present

## 2017-05-30 DIAGNOSIS — R2689 Other abnormalities of gait and mobility: Secondary | ICD-10-CM | POA: Diagnosis not present

## 2017-05-30 DIAGNOSIS — R531 Weakness: Secondary | ICD-10-CM | POA: Diagnosis not present

## 2017-05-30 DIAGNOSIS — Z79899 Other long term (current) drug therapy: Secondary | ICD-10-CM | POA: Diagnosis not present

## 2017-05-30 DIAGNOSIS — I10 Essential (primary) hypertension: Secondary | ICD-10-CM | POA: Diagnosis not present

## 2017-05-30 DIAGNOSIS — R21 Rash and other nonspecific skin eruption: Secondary | ICD-10-CM | POA: Diagnosis not present

## 2017-05-30 DIAGNOSIS — R296 Repeated falls: Secondary | ICD-10-CM | POA: Diagnosis not present

## 2017-05-30 DIAGNOSIS — F3289 Other specified depressive episodes: Secondary | ICD-10-CM | POA: Diagnosis not present

## 2017-05-30 DIAGNOSIS — G308 Other Alzheimer's disease: Secondary | ICD-10-CM | POA: Diagnosis not present

## 2017-05-31 DIAGNOSIS — N390 Urinary tract infection, site not specified: Secondary | ICD-10-CM | POA: Diagnosis not present

## 2017-06-01 DIAGNOSIS — I129 Hypertensive chronic kidney disease with stage 1 through stage 4 chronic kidney disease, or unspecified chronic kidney disease: Secondary | ICD-10-CM | POA: Diagnosis not present

## 2017-06-01 DIAGNOSIS — F329 Major depressive disorder, single episode, unspecified: Secondary | ICD-10-CM | POA: Diagnosis not present

## 2017-06-01 DIAGNOSIS — N183 Chronic kidney disease, stage 3 (moderate): Secondary | ICD-10-CM | POA: Diagnosis not present

## 2017-06-01 DIAGNOSIS — F419 Anxiety disorder, unspecified: Secondary | ICD-10-CM | POA: Diagnosis not present

## 2017-06-01 DIAGNOSIS — F0390 Unspecified dementia without behavioral disturbance: Secondary | ICD-10-CM | POA: Diagnosis not present

## 2017-06-01 DIAGNOSIS — G308 Other Alzheimer's disease: Secondary | ICD-10-CM | POA: Diagnosis not present

## 2017-06-01 DIAGNOSIS — F0391 Unspecified dementia with behavioral disturbance: Secondary | ICD-10-CM | POA: Diagnosis not present

## 2017-06-01 DIAGNOSIS — F3289 Other specified depressive episodes: Secondary | ICD-10-CM | POA: Diagnosis not present

## 2017-06-01 DIAGNOSIS — R2689 Other abnormalities of gait and mobility: Secondary | ICD-10-CM | POA: Diagnosis not present

## 2017-06-01 DIAGNOSIS — F331 Major depressive disorder, recurrent, moderate: Secondary | ICD-10-CM | POA: Diagnosis not present

## 2017-06-01 DIAGNOSIS — R29898 Other symptoms and signs involving the musculoskeletal system: Secondary | ICD-10-CM | POA: Diagnosis not present

## 2017-06-05 DIAGNOSIS — F0391 Unspecified dementia with behavioral disturbance: Secondary | ICD-10-CM | POA: Diagnosis not present

## 2017-06-05 DIAGNOSIS — F325 Major depressive disorder, single episode, in full remission: Secondary | ICD-10-CM | POA: Diagnosis not present

## 2017-06-06 DIAGNOSIS — F0390 Unspecified dementia without behavioral disturbance: Secondary | ICD-10-CM | POA: Diagnosis not present

## 2017-06-06 DIAGNOSIS — Z79899 Other long term (current) drug therapy: Secondary | ICD-10-CM | POA: Diagnosis not present

## 2017-06-06 DIAGNOSIS — G308 Other Alzheimer's disease: Secondary | ICD-10-CM | POA: Diagnosis not present

## 2017-06-06 DIAGNOSIS — I129 Hypertensive chronic kidney disease with stage 1 through stage 4 chronic kidney disease, or unspecified chronic kidney disease: Secondary | ICD-10-CM | POA: Diagnosis not present

## 2017-06-06 DIAGNOSIS — I10 Essential (primary) hypertension: Secondary | ICD-10-CM | POA: Diagnosis not present

## 2017-06-06 DIAGNOSIS — N183 Chronic kidney disease, stage 3 (moderate): Secondary | ICD-10-CM | POA: Diagnosis not present

## 2017-06-06 DIAGNOSIS — F329 Major depressive disorder, single episode, unspecified: Secondary | ICD-10-CM | POA: Diagnosis not present

## 2017-06-06 DIAGNOSIS — R2689 Other abnormalities of gait and mobility: Secondary | ICD-10-CM | POA: Diagnosis not present

## 2017-06-06 DIAGNOSIS — R21 Rash and other nonspecific skin eruption: Secondary | ICD-10-CM | POA: Diagnosis not present

## 2017-06-06 DIAGNOSIS — R296 Repeated falls: Secondary | ICD-10-CM | POA: Diagnosis not present

## 2017-06-06 DIAGNOSIS — R29898 Other symptoms and signs involving the musculoskeletal system: Secondary | ICD-10-CM | POA: Diagnosis not present

## 2017-06-07 DIAGNOSIS — F411 Generalized anxiety disorder: Secondary | ICD-10-CM | POA: Diagnosis not present

## 2017-06-07 DIAGNOSIS — F331 Major depressive disorder, recurrent, moderate: Secondary | ICD-10-CM | POA: Diagnosis not present

## 2017-06-08 DIAGNOSIS — F0390 Unspecified dementia without behavioral disturbance: Secondary | ICD-10-CM | POA: Diagnosis not present

## 2017-06-08 DIAGNOSIS — R29898 Other symptoms and signs involving the musculoskeletal system: Secondary | ICD-10-CM | POA: Diagnosis not present

## 2017-06-08 DIAGNOSIS — R2689 Other abnormalities of gait and mobility: Secondary | ICD-10-CM | POA: Diagnosis not present

## 2017-06-08 DIAGNOSIS — I129 Hypertensive chronic kidney disease with stage 1 through stage 4 chronic kidney disease, or unspecified chronic kidney disease: Secondary | ICD-10-CM | POA: Diagnosis not present

## 2017-06-08 DIAGNOSIS — F329 Major depressive disorder, single episode, unspecified: Secondary | ICD-10-CM | POA: Diagnosis not present

## 2017-06-08 DIAGNOSIS — N183 Chronic kidney disease, stage 3 (moderate): Secondary | ICD-10-CM | POA: Diagnosis not present

## 2017-06-09 DIAGNOSIS — Z79899 Other long term (current) drug therapy: Secondary | ICD-10-CM | POA: Diagnosis not present

## 2017-06-14 DIAGNOSIS — R29898 Other symptoms and signs involving the musculoskeletal system: Secondary | ICD-10-CM | POA: Diagnosis not present

## 2017-06-14 DIAGNOSIS — R2689 Other abnormalities of gait and mobility: Secondary | ICD-10-CM | POA: Diagnosis not present

## 2017-06-14 DIAGNOSIS — F329 Major depressive disorder, single episode, unspecified: Secondary | ICD-10-CM | POA: Diagnosis not present

## 2017-06-14 DIAGNOSIS — F0390 Unspecified dementia without behavioral disturbance: Secondary | ICD-10-CM | POA: Diagnosis not present

## 2017-06-14 DIAGNOSIS — N183 Chronic kidney disease, stage 3 (moderate): Secondary | ICD-10-CM | POA: Diagnosis not present

## 2017-06-14 DIAGNOSIS — G3184 Mild cognitive impairment, so stated: Secondary | ICD-10-CM | POA: Diagnosis not present

## 2017-06-14 DIAGNOSIS — I10 Essential (primary) hypertension: Secondary | ICD-10-CM | POA: Diagnosis not present

## 2017-06-14 DIAGNOSIS — L853 Xerosis cutis: Secondary | ICD-10-CM | POA: Diagnosis not present

## 2017-06-14 DIAGNOSIS — L308 Other specified dermatitis: Secondary | ICD-10-CM | POA: Diagnosis not present

## 2017-06-14 DIAGNOSIS — I129 Hypertensive chronic kidney disease with stage 1 through stage 4 chronic kidney disease, or unspecified chronic kidney disease: Secondary | ICD-10-CM | POA: Diagnosis not present

## 2017-06-14 DIAGNOSIS — F39 Unspecified mood [affective] disorder: Secondary | ICD-10-CM | POA: Diagnosis not present

## 2017-06-16 DIAGNOSIS — R2689 Other abnormalities of gait and mobility: Secondary | ICD-10-CM | POA: Diagnosis not present

## 2017-06-16 DIAGNOSIS — R29898 Other symptoms and signs involving the musculoskeletal system: Secondary | ICD-10-CM | POA: Diagnosis not present

## 2017-06-16 DIAGNOSIS — F329 Major depressive disorder, single episode, unspecified: Secondary | ICD-10-CM | POA: Diagnosis not present

## 2017-06-16 DIAGNOSIS — F0390 Unspecified dementia without behavioral disturbance: Secondary | ICD-10-CM | POA: Diagnosis not present

## 2017-06-16 DIAGNOSIS — N183 Chronic kidney disease, stage 3 (moderate): Secondary | ICD-10-CM | POA: Diagnosis not present

## 2017-06-16 DIAGNOSIS — I129 Hypertensive chronic kidney disease with stage 1 through stage 4 chronic kidney disease, or unspecified chronic kidney disease: Secondary | ICD-10-CM | POA: Diagnosis not present

## 2017-06-20 DIAGNOSIS — R29898 Other symptoms and signs involving the musculoskeletal system: Secondary | ICD-10-CM | POA: Diagnosis not present

## 2017-06-20 DIAGNOSIS — I129 Hypertensive chronic kidney disease with stage 1 through stage 4 chronic kidney disease, or unspecified chronic kidney disease: Secondary | ICD-10-CM | POA: Diagnosis not present

## 2017-06-20 DIAGNOSIS — N183 Chronic kidney disease, stage 3 (moderate): Secondary | ICD-10-CM | POA: Diagnosis not present

## 2017-06-20 DIAGNOSIS — R2689 Other abnormalities of gait and mobility: Secondary | ICD-10-CM | POA: Diagnosis not present

## 2017-06-20 DIAGNOSIS — F329 Major depressive disorder, single episode, unspecified: Secondary | ICD-10-CM | POA: Diagnosis not present

## 2017-06-20 DIAGNOSIS — F0390 Unspecified dementia without behavioral disturbance: Secondary | ICD-10-CM | POA: Diagnosis not present

## 2017-06-21 DIAGNOSIS — F331 Major depressive disorder, recurrent, moderate: Secondary | ICD-10-CM | POA: Diagnosis not present

## 2017-06-21 DIAGNOSIS — F411 Generalized anxiety disorder: Secondary | ICD-10-CM | POA: Diagnosis not present

## 2017-06-22 DIAGNOSIS — R29898 Other symptoms and signs involving the musculoskeletal system: Secondary | ICD-10-CM | POA: Diagnosis not present

## 2017-06-22 DIAGNOSIS — F0390 Unspecified dementia without behavioral disturbance: Secondary | ICD-10-CM | POA: Diagnosis not present

## 2017-06-22 DIAGNOSIS — I129 Hypertensive chronic kidney disease with stage 1 through stage 4 chronic kidney disease, or unspecified chronic kidney disease: Secondary | ICD-10-CM | POA: Diagnosis not present

## 2017-06-22 DIAGNOSIS — N183 Chronic kidney disease, stage 3 (moderate): Secondary | ICD-10-CM | POA: Diagnosis not present

## 2017-06-22 DIAGNOSIS — R2689 Other abnormalities of gait and mobility: Secondary | ICD-10-CM | POA: Diagnosis not present

## 2017-06-22 DIAGNOSIS — F329 Major depressive disorder, single episode, unspecified: Secondary | ICD-10-CM | POA: Diagnosis not present

## 2017-06-23 DIAGNOSIS — G308 Other Alzheimer's disease: Secondary | ICD-10-CM | POA: Diagnosis not present

## 2017-06-23 DIAGNOSIS — F411 Generalized anxiety disorder: Secondary | ICD-10-CM | POA: Diagnosis not present

## 2017-06-23 DIAGNOSIS — F419 Anxiety disorder, unspecified: Secondary | ICD-10-CM | POA: Diagnosis not present

## 2017-06-23 DIAGNOSIS — F4321 Adjustment disorder with depressed mood: Secondary | ICD-10-CM | POA: Diagnosis not present

## 2017-06-23 DIAGNOSIS — F330 Major depressive disorder, recurrent, mild: Secondary | ICD-10-CM | POA: Diagnosis not present

## 2017-06-28 DIAGNOSIS — F331 Major depressive disorder, recurrent, moderate: Secondary | ICD-10-CM | POA: Diagnosis not present

## 2017-06-28 DIAGNOSIS — F411 Generalized anxiety disorder: Secondary | ICD-10-CM | POA: Diagnosis not present

## 2017-07-05 DIAGNOSIS — R296 Repeated falls: Secondary | ICD-10-CM | POA: Diagnosis not present

## 2017-07-05 DIAGNOSIS — R2689 Other abnormalities of gait and mobility: Secondary | ICD-10-CM | POA: Diagnosis not present

## 2017-07-05 DIAGNOSIS — R21 Rash and other nonspecific skin eruption: Secondary | ICD-10-CM | POA: Diagnosis not present

## 2017-07-05 DIAGNOSIS — F325 Major depressive disorder, single episode, in full remission: Secondary | ICD-10-CM | POA: Diagnosis not present

## 2017-07-05 DIAGNOSIS — F0391 Unspecified dementia with behavioral disturbance: Secondary | ICD-10-CM | POA: Diagnosis not present

## 2017-07-05 DIAGNOSIS — Z79899 Other long term (current) drug therapy: Secondary | ICD-10-CM | POA: Diagnosis not present

## 2017-07-05 DIAGNOSIS — I10 Essential (primary) hypertension: Secondary | ICD-10-CM | POA: Diagnosis not present

## 2017-07-05 DIAGNOSIS — G308 Other Alzheimer's disease: Secondary | ICD-10-CM | POA: Diagnosis not present

## 2017-07-05 DIAGNOSIS — E039 Hypothyroidism, unspecified: Secondary | ICD-10-CM | POA: Diagnosis not present

## 2017-07-06 DIAGNOSIS — F411 Generalized anxiety disorder: Secondary | ICD-10-CM | POA: Diagnosis not present

## 2017-07-06 DIAGNOSIS — F331 Major depressive disorder, recurrent, moderate: Secondary | ICD-10-CM | POA: Diagnosis not present

## 2017-07-10 DIAGNOSIS — L84 Corns and callosities: Secondary | ICD-10-CM | POA: Diagnosis not present

## 2017-07-10 DIAGNOSIS — I70293 Other atherosclerosis of native arteries of extremities, bilateral legs: Secondary | ICD-10-CM | POA: Diagnosis not present

## 2017-07-10 DIAGNOSIS — M2041 Other hammer toe(s) (acquired), right foot: Secondary | ICD-10-CM | POA: Diagnosis not present

## 2017-07-10 DIAGNOSIS — B351 Tinea unguium: Secondary | ICD-10-CM | POA: Diagnosis not present

## 2017-07-17 ENCOUNTER — Emergency Department (HOSPITAL_BASED_OUTPATIENT_CLINIC_OR_DEPARTMENT_OTHER): Payer: Medicare Other

## 2017-07-17 ENCOUNTER — Emergency Department (HOSPITAL_BASED_OUTPATIENT_CLINIC_OR_DEPARTMENT_OTHER)
Admission: EM | Admit: 2017-07-17 | Discharge: 2017-07-18 | Disposition: A | Discharge status: Home / Self Care | Payer: Medicare Other | Attending: Emergency Medicine | Admitting: Emergency Medicine

## 2017-07-17 ENCOUNTER — Encounter (HOSPITAL_BASED_OUTPATIENT_CLINIC_OR_DEPARTMENT_OTHER): Payer: Self-pay | Admitting: *Deleted

## 2017-07-17 DIAGNOSIS — S0990XA Unspecified injury of head, initial encounter: Secondary | ICD-10-CM | POA: Diagnosis not present

## 2017-07-17 DIAGNOSIS — I10 Essential (primary) hypertension: Secondary | ICD-10-CM | POA: Insufficient documentation

## 2017-07-17 DIAGNOSIS — Y998 Other external cause status: Secondary | ICD-10-CM | POA: Diagnosis not present

## 2017-07-17 DIAGNOSIS — Z87891 Personal history of nicotine dependence: Secondary | ICD-10-CM | POA: Diagnosis not present

## 2017-07-17 DIAGNOSIS — T148XXA Other injury of unspecified body region, initial encounter: Secondary | ICD-10-CM | POA: Diagnosis not present

## 2017-07-17 DIAGNOSIS — S199XXA Unspecified injury of neck, initial encounter: Secondary | ICD-10-CM | POA: Diagnosis not present

## 2017-07-17 DIAGNOSIS — Y9389 Activity, other specified: Secondary | ICD-10-CM | POA: Diagnosis not present

## 2017-07-17 DIAGNOSIS — F0390 Unspecified dementia without behavioral disturbance: Secondary | ICD-10-CM | POA: Insufficient documentation

## 2017-07-17 DIAGNOSIS — Z79899 Other long term (current) drug therapy: Secondary | ICD-10-CM | POA: Insufficient documentation

## 2017-07-17 DIAGNOSIS — E039 Hypothyroidism, unspecified: Secondary | ICD-10-CM | POA: Insufficient documentation

## 2017-07-17 DIAGNOSIS — S0003XA Contusion of scalp, initial encounter: Secondary | ICD-10-CM | POA: Insufficient documentation

## 2017-07-17 DIAGNOSIS — W19XXXA Unspecified fall, initial encounter: Secondary | ICD-10-CM

## 2017-07-17 DIAGNOSIS — R229 Localized swelling, mass and lump, unspecified: Secondary | ICD-10-CM | POA: Diagnosis not present

## 2017-07-17 DIAGNOSIS — W1812XA Fall from or off toilet with subsequent striking against object, initial encounter: Secondary | ICD-10-CM | POA: Diagnosis not present

## 2017-07-17 DIAGNOSIS — Z7982 Long term (current) use of aspirin: Secondary | ICD-10-CM | POA: Diagnosis not present

## 2017-07-17 DIAGNOSIS — Y92121 Bathroom in nursing home as the place of occurrence of the external cause: Secondary | ICD-10-CM | POA: Insufficient documentation

## 2017-07-17 NOTE — Discharge Instructions (Signed)
During the workup we noted incidental findings on your imaging that would require you to follow-up with your regular doctor for further evaluation/management:  1.12 mm hypodense nodule right lobe of thyroid, nonemergent thyroid ultrasound follow-up as indicated

## 2017-07-17 NOTE — ED Provider Notes (Signed)
[facility] EMERGENCY DEPARTMENT Provider Note  CSN: [id number] Arrival date & time: 07/17/17 2056  Chief Complaint(s) Fall  HPI Diamond [name] is a 84 y.o. female here after fall at [facility].  Patient has a history of dementia and recurrent falls.  States that she was on the toilet and fell while trying to get up causing her to fall backwards and hitting her head.  She denied any loss of consciousness.  Patient is endorsing occipital pain associated with contusion otherwise asymptomatic.  Pain is exacerbated with palpation.  Alleviated when left alone.  No other alleviating or aggravating factors.  She denies any other physical complaints.  She denies any recent fevers or infections.  No recent nausea or vomiting.  No abdominal pain.  No chest pain or shortness of breath.  No focal deficits.  She is not on any anticoagulation.  HPI  Past Medical History Past Medical History:  Diagnosis Date  . Arthritis    "everywhere"  . Dementia   . High cholesterol   . History of blood transfusion    "related to my hip OR"  . History of gout   . Hypertension   . Hypothyroidism   . Migraine    "q 5-6 years" (06/06/2014)   Patient Active Problem List   Diagnosis Date Noted  . High cholesterol   . UTI (lower urinary tract infection)   . Anemia 06/06/2014  . E. coli UTI 06/06/2014  . Generalized weakness 06/06/2014  . Left humeral fracture 06/06/2014  . Depression 06/06/2014  . Hypothyroidism 06/06/2014  . Overactive bladder 06/06/2014  . Normocytic anemia 06/06/2014  . Frequent falls   . Bleeding gastrointestinal    Home Medication(s) Prior to Admission medications   Medication Sig Start Date End Date Taking? Authorizing Provider  acetaminophen (TYLENOL) 650 MG CR tablet Take 650 mg by mouth every 8 (eight) hours as needed for pain.    [provider]  aspirin EC 81 MG tablet Take 1 tablet (81 mg total) by mouth daily. 03/29/17   Pricilla Loveless, MD  buPROPion  (WELLBUTRIN) 75 MG tablet Take 37.5 mg by mouth 2 (two) times daily.    [provider]  calcium-vitamin D (OSCAL WITH D) 500-200 MG-UNIT tablet Take 1 tablet by mouth 2 (two) times daily.    [provider]  citalopram (CELEXA) 20 MG tablet Take 20 mg by mouth daily.    [provider]  divalproex (DEPAKOTE ER) 250 MG 24 hr tablet Take 1 tablet every night 03/27/17   Van Clines, MD  donepezil (ARICEPT) 10 MG tablet Take 10 mg by mouth at bedtime.    [provider]  hydrOXYzine (ATARAX/VISTARIL) 10 MG tablet Take 10 mg by mouth 2 (two) times daily as needed.    [provider]  iron polysaccharides (NIFEREX) 150 MG capsule Take 150 mg by mouth daily.    [provider]  levothyroxine (SYNTHROID, LEVOTHROID) 50 MCG tablet Take 50 mcg by mouth daily before breakfast.    [provider]  MULTIPLE VITAMIN PO Take by mouth.    [provider]  nitrofurantoin, macrocrystal-monohydrate, (MACROBID) 100 MG capsule Take 1 capsule (100 mg total) by mouth 2 (two) times daily. 04/19/17   Rolland Porter, MD  pantoprazole (PROTONIX) 40 MG tablet Take 1 tablet (40 mg total) by mouth daily. 06/09/14   Rodolph Bong, MD  simvastatin (ZOCOR) 40 MG tablet Take 40 mg by mouth at bedtime.     [provider]                                                                                                                                    Past Surgical History Past Surgical History:  Procedure Laterality Date  . ABDOMINAL HYSTERECTOMY    . ANKLE FRACTURE SURGERY Right 1988   "crushed it"  . CHOLECYSTECTOMY  1970's  . DILATION AND CURETTAGE OF UTERUS    . FRACTURE SURGERY    . JOINT REPLACEMENT    . TOTAL HIP ARTHROPLASTY Right 1980's  . TUBAL LIGATION     Family History Family History  Problem Relation Age of Onset  . Heart attack Father     Social History Social History   Tobacco Use  . Smoking status: Former Smoker      Packs/day: 2.00    Years: 40.00    Pack years: 80.00    Types: Cigarettes    Last attempt to quit: 05/05/1987    Years since quitting: 30.2  . Smokeless tobacco: Never Used  Substance Use Topics  . Alcohol use: Yes    Alcohol/week: 4.2 oz    Types: 7 Glasses of wine per week  . Drug use: No   Allergies Codeine and Penicillins  Review of Systems Review of Systems All other systems are reviewed and are negative for acute change except as noted in the HPI  Physical Exam Vital Signs  I have reviewed the triage vital signs BP (!) 177/70 (BP Location: Right Arm)   Pulse 61   Temp 97.9 F (36.6 C) (Oral)   Resp 18   Ht 5' (1.524 m)   Wt 50.3 kg (111 lb)   SpO2 98%   BMI 21.68 kg/m   Physical Exam  Constitutional: She appears well-developed and well-nourished. No distress.  HENT:  Head: Normocephalic. Head is with contusion.    Right Ear: External ear normal.  Left Ear: External ear normal.  Nose: Nose normal.  Eyes: Conjunctivae and EOM are normal. Pupils are equal, round, and reactive to light. Right eye exhibits no discharge. Left eye exhibits no discharge. No scleral icterus.  Neck: Normal range of motion. Neck supple. No spinous process tenderness and no muscular tenderness present.  Cardiovascular: Normal rate, regular rhythm and normal heart sounds. Exam reveals no gallop and no friction rub.  No murmur heard. Pulses:      Radial pulses are 2+ on the right side, and 2+ on the left side.       Dorsalis pedis pulses are 2+ on the right side, and 2+ on the left side.  Pulmonary/Chest: Effort normal and breath sounds normal. No stridor. No respiratory distress. She has no wheezes.  Abdominal: Soft. She exhibits no distension. There is no tenderness.  Musculoskeletal: She exhibits no edema or tenderness.       Cervical back: She exhibits no bony tenderness.       Thoracic back:  She exhibits no bony tenderness.       Lumbar back: She exhibits no bony tenderness.   Clavicles stable. Chest stable to AP/Lat compression. Pelvis stable to Lat compression. No obvious extremity deformity. No chest or abdominal wall contusion.  Neurological: She is alert.  Moving all extremities  Skin: Skin is warm and dry. No rash noted. She is not diaphoretic. No erythema.  Psychiatric: She has a normal mood and affect.    ED Results and Treatments Labs (all labs ordered are listed, but only abnormal results are displayed) Labs Reviewed - No data to display                                                                                                                       EKG  EKG Interpretation  Date/Time:    Ventricular Rate:    PR Interval:    QRS Duration:   QT Interval:    QTC Calculation:   R Axis:     Text Interpretation:        Radiology Ct Head Wo Contrast  Result Date: 07/17/2017 CLINICAL DATA:  Larey SeatFell and hit back of head EXAM: CT HEAD WITHOUT CONTRAST CT CERVICAL SPINE WITHOUT CONTRAST TECHNIQUE: Multidetector CT imaging of the head and cervical spine was performed following the standard protocol without intravenous contrast. Multiplanar CT image reconstructions of the cervical spine were also generated. COMPARISON:  CT brain 04/19/2017 FINDINGS: CT HEAD FINDINGS Brain: No acute territorial infarction, hemorrhage or intracranial mass is visualized. Moderate atrophy. Mild to moderate small vessel ischemic changes of the white matter. Stable ventricle size Vascular: No hyperdense vessels.  Carotid artery calcification. Skull: No fracture or suspicious lesion Sinuses/Orbits: Mild mucosal thickening in the ethmoid sinuses. No acute orbital abnormality Other: Small right posterior scalp swelling CT CERVICAL SPINE FINDINGS Alignment: Straightening of the cervical spine. No subluxation. Facet alignment within normal limits Skull base and vertebrae: No acute fracture. No primary bone lesion or focal pathologic process. Soft tissues and spinal canal: No  prevertebral fluid or swelling. No visible canal hematoma. Disc levels: Mild degenerative changes at C3-C4 and C4-C5 with moderate degenerative changes at C5-C6 and C6-C7. Prominent anterior osteophytes at all levels. Multiple level bilateral hypertrophic facet arthropathy. Multiple level foraminal stenosis, most marked at C5-C6 and C6-C7. Upper chest: 12 mm hypodense nodule in the right lobe of the thyroid. Lung apices are clear. Bilateral carotid artery calcification Other: None IMPRESSION: 1. No CT evidence for acute intracranial abnormality. Atrophy and small vessel ischemic changes of the white matter 2. Moderate diffuse degenerative changes. No acute osseous abnormality 3. 12 mm hypodense nodule right lobe of thyroid, nonemergent thyroid ultrasound follow-up as indicated Electronically Signed   By: Jasmine PangKim  Fujinaga M.D.   On: 07/17/2017 21:52   Ct Cervical Spine Wo Contrast  Result Date: 07/17/2017 CLINICAL DATA:  Larey SeatFell and hit back of head EXAM: CT HEAD WITHOUT CONTRAST CT CERVICAL SPINE WITHOUT CONTRAST TECHNIQUE: Multidetector CT imaging of the head and cervical spine was  performed following the standard protocol without intravenous contrast. Multiplanar CT image reconstructions of the cervical spine were also generated. COMPARISON:  CT brain 04/19/2017 FINDINGS: CT HEAD FINDINGS Brain: No acute territorial infarction, hemorrhage or intracranial mass is visualized. Moderate atrophy. Mild to moderate small vessel ischemic changes of the white matter. Stable ventricle size Vascular: No hyperdense vessels.  Carotid artery calcification. Skull: No fracture or suspicious lesion Sinuses/Orbits: Mild mucosal thickening in the ethmoid sinuses. No acute orbital abnormality Other: Small right posterior scalp swelling CT CERVICAL SPINE FINDINGS Alignment: Straightening of the cervical spine. No subluxation. Facet alignment within normal limits Skull base and vertebrae: No acute fracture. No primary bone lesion or  focal pathologic process. Soft tissues and spinal canal: No prevertebral fluid or swelling. No visible canal hematoma. Disc levels: Mild degenerative changes at C3-C4 and C4-C5 with moderate degenerative changes at C5-C6 and C6-C7. Prominent anterior osteophytes at all levels. Multiple level bilateral hypertrophic facet arthropathy. Multiple level foraminal stenosis, most marked at C5-C6 and C6-C7. Upper chest: 12 mm hypodense nodule in the right lobe of the thyroid. Lung apices are clear. Bilateral carotid artery calcification Other: None IMPRESSION: 1. No CT evidence for acute intracranial abnormality. Atrophy and small vessel ischemic changes of the white matter 2. Moderate diffuse degenerative changes. No acute osseous abnormality 3. 12 mm hypodense nodule right lobe of thyroid, nonemergent thyroid ultrasound follow-up as indicated Electronically Signed   By: Jasmine Pang M.D.   On: 07/17/2017 21:52   Pertinent labs & imaging results that were available during my care of the patient were reviewed by me and considered in my medical decision making (see chart for details).  Medications Ordered in ED Medications - No data to display                                                                                                                                  Procedures Procedures  (including critical care time)  Medical Decision Making / ED Course I have reviewed the nursing notes for this encounter and the patient's prior records (if available in EHR or on provided paperwork).    Minor head injury. CT head and neck negative. At baseline mental status per family. The patient appears reasonably screened and/or stabilized for discharge and I doubt any other medical condition or other [facility] requiring further screening, evaluation, or treatment in the ED at this time prior to discharge.  The patient is safe for discharge with strict return precautions.   Final Clinical Impression(s) / ED  Diagnoses Final diagnoses:  Fall, initial encounter  Contusion of scalp, initial encounter  Minor head injury, initial encounter    Disposition: Discharge  Condition: Good  I have discussed the results, Dx and Tx plan with the patient and family who expressed understanding and agree(s) with the plan. Discharge instructions discussed at great length. The patient and family was given strict return precautions who verbalized understanding of the  instructions. No further questions at time of discharge.    ED Discharge Orders    None       Follow Up: Jarrett Soho, PA-C [address] [place] [address] [phone]  Schedule an appointment as soon as possible for a visit  As needed     This chart was dictated using voice recognition software.  Despite best efforts to proofread,  errors can occur which can change the documentation meaning.   Nira Conn, MD 07/17/17 2231

## 2017-07-17 NOTE — ED Triage Notes (Addendum)
Per EMS: pt from [facility]. Fell in bathroom, hit her posterior head. Pt is not on blood thinners. No bleeding noted. Denies pain. No LOC. VSS.  CBG 149

## 2017-07-18 DIAGNOSIS — G308 Other Alzheimer's disease: Secondary | ICD-10-CM | POA: Diagnosis not present

## 2017-07-18 DIAGNOSIS — E041 Nontoxic single thyroid nodule: Secondary | ICD-10-CM | POA: Diagnosis not present

## 2017-07-18 DIAGNOSIS — I10 Essential (primary) hypertension: Secondary | ICD-10-CM | POA: Diagnosis not present

## 2017-07-18 DIAGNOSIS — R233 Spontaneous ecchymoses: Secondary | ICD-10-CM | POA: Diagnosis not present

## 2017-07-18 DIAGNOSIS — R2689 Other abnormalities of gait and mobility: Secondary | ICD-10-CM | POA: Diagnosis not present

## 2017-07-18 DIAGNOSIS — F989 Unspecified behavioral and emotional disorders with onset usually occurring in childhood and adolescence: Secondary | ICD-10-CM | POA: Diagnosis not present

## 2017-07-18 DIAGNOSIS — R451 Restlessness and agitation: Secondary | ICD-10-CM | POA: Diagnosis not present

## 2017-07-19 DIAGNOSIS — E041 Nontoxic single thyroid nodule: Secondary | ICD-10-CM | POA: Diagnosis not present

## 2017-07-20 DIAGNOSIS — R399 Unspecified symptoms and signs involving the genitourinary system: Secondary | ICD-10-CM | POA: Diagnosis not present

## 2017-07-23 ENCOUNTER — Encounter (HOSPITAL_BASED_OUTPATIENT_CLINIC_OR_DEPARTMENT_OTHER): Payer: Self-pay | Admitting: *Deleted

## 2017-07-23 ENCOUNTER — Emergency Department (HOSPITAL_BASED_OUTPATIENT_CLINIC_OR_DEPARTMENT_OTHER): Payer: Medicare Other

## 2017-07-23 ENCOUNTER — Other Ambulatory Visit: Payer: Self-pay

## 2017-07-23 ENCOUNTER — Emergency Department (HOSPITAL_BASED_OUTPATIENT_CLINIC_OR_DEPARTMENT_OTHER)
Admission: EM | Admit: 2017-07-23 | Discharge: 2017-07-23 | Disposition: A | Discharge status: Skilled Nursing Facility | Payer: Medicare Other | Attending: Emergency Medicine | Admitting: Emergency Medicine

## 2017-07-23 DIAGNOSIS — Z9181 History of falling: Secondary | ICD-10-CM | POA: Insufficient documentation

## 2017-07-23 DIAGNOSIS — Y9210 Unspecified residential institution as the place of occurrence of the external cause: Secondary | ICD-10-CM | POA: Insufficient documentation

## 2017-07-23 DIAGNOSIS — W1811XA Fall from or off toilet without subsequent striking against object, initial encounter: Secondary | ICD-10-CM | POA: Diagnosis not present

## 2017-07-23 DIAGNOSIS — M25519 Pain in unspecified shoulder: Secondary | ICD-10-CM | POA: Diagnosis not present

## 2017-07-23 DIAGNOSIS — F0390 Unspecified dementia without behavioral disturbance: Secondary | ICD-10-CM | POA: Diagnosis not present

## 2017-07-23 DIAGNOSIS — S199XXA Unspecified injury of neck, initial encounter: Secondary | ICD-10-CM | POA: Diagnosis not present

## 2017-07-23 DIAGNOSIS — Z87891 Personal history of nicotine dependence: Secondary | ICD-10-CM | POA: Insufficient documentation

## 2017-07-23 DIAGNOSIS — E039 Hypothyroidism, unspecified: Secondary | ICD-10-CM | POA: Insufficient documentation

## 2017-07-23 DIAGNOSIS — F330 Major depressive disorder, recurrent, mild: Secondary | ICD-10-CM | POA: Diagnosis not present

## 2017-07-23 DIAGNOSIS — F419 Anxiety disorder, unspecified: Secondary | ICD-10-CM | POA: Diagnosis not present

## 2017-07-23 DIAGNOSIS — Z7982 Long term (current) use of aspirin: Secondary | ICD-10-CM | POA: Diagnosis not present

## 2017-07-23 DIAGNOSIS — Y999 Unspecified external cause status: Secondary | ICD-10-CM | POA: Insufficient documentation

## 2017-07-23 DIAGNOSIS — Y93E8 Activity, other personal hygiene: Secondary | ICD-10-CM | POA: Diagnosis not present

## 2017-07-23 DIAGNOSIS — Z79899 Other long term (current) drug therapy: Secondary | ICD-10-CM | POA: Diagnosis not present

## 2017-07-23 DIAGNOSIS — W19XXXA Unspecified fall, initial encounter: Secondary | ICD-10-CM

## 2017-07-23 DIAGNOSIS — I10 Essential (primary) hypertension: Secondary | ICD-10-CM | POA: Diagnosis not present

## 2017-07-23 DIAGNOSIS — F411 Generalized anxiety disorder: Secondary | ICD-10-CM | POA: Diagnosis not present

## 2017-07-23 DIAGNOSIS — M542 Cervicalgia: Secondary | ICD-10-CM | POA: Diagnosis not present

## 2017-07-23 DIAGNOSIS — T148XXA Other injury of unspecified body region, initial encounter: Secondary | ICD-10-CM | POA: Diagnosis not present

## 2017-07-23 DIAGNOSIS — F4321 Adjustment disorder with depressed mood: Secondary | ICD-10-CM | POA: Diagnosis not present

## 2017-07-23 DIAGNOSIS — S0990XA Unspecified injury of head, initial encounter: Secondary | ICD-10-CM | POA: Diagnosis not present

## 2017-07-23 DIAGNOSIS — F3289 Other specified depressive episodes: Secondary | ICD-10-CM | POA: Diagnosis not present

## 2017-07-23 DIAGNOSIS — F418 Other specified anxiety disorders: Secondary | ICD-10-CM | POA: Diagnosis not present

## 2017-07-23 LAB — URINALYSIS, MICROSCOPIC (REFLEX): WBC UA: NONE SEEN WBC/hpf (ref 0–5)

## 2017-07-23 LAB — URINALYSIS, ROUTINE W REFLEX MICROSCOPIC
BILIRUBIN URINE: NEGATIVE
Glucose, UA: NEGATIVE mg/dL
Ketones, ur: 15 mg/dL — AB
Leukocytes, UA: NEGATIVE
Nitrite: NEGATIVE
PH: 6.5 (ref 5.0–8.0)
Protein, ur: 30 mg/dL — AB
SPECIFIC GRAVITY, URINE: 1.02 (ref 1.005–1.030)

## 2017-07-23 NOTE — ED Provider Notes (Signed)
Urinalysis is negative for UTI.  She was able to ambulate and bear weight to the bathroom with some assistance.  CT head and C-spine benign.  She has had more and more falls and was recently switched from Depakote to Abilify, medications may be playing a role.  Follow-up with PCP for outpatient follow-up and medication adjustment as needed.  Discussed with daughter at bedside.   Pricilla LovelessGoldston, Raseel Jans, MD 07/23/17 [phone]

## 2017-07-23 NOTE — ED Provider Notes (Signed)
[facility] EMERGENCY DEPARTMENT Provider Note   CSN: [id number] Arrival date & time: 07/23/17  7829     History   Chief Complaint Chief Complaint  Patient presents with  . Fall    HPI Diamond [name] is a 84 y.o. female.  HPI  This is an 84 year old female who presents from her living facility following a fall.  Fall was unwitnessed.  Patient with history of dementia.  Patient states that she went to the bathroom and fell off of the toilet.  She does not think she lost consciousness.  She reported neck pain and shoulder pain to EMS.  Denies any blood thinners.  She is alert and oriented x2.  She is disoriented to time.  When asked where she hurts, she states "nothing new."  Denies chest pain or shortness of breath.  Patient was ambulatory following the fall.  Level 5 caveat for dementia.  Past Medical History:  Diagnosis Date  . Arthritis    "everywhere"  . Dementia   . High cholesterol   . History of blood transfusion    "related to my hip OR"  . History of gout   . Hypertension   . Hypothyroidism   . Migraine    "q 5-6 years" (06/06/2014)    Patient Active Problem List   Diagnosis Date Noted  . High cholesterol   . UTI (lower urinary tract infection)   . Anemia 06/06/2014  . E. coli UTI 06/06/2014  . Generalized weakness 06/06/2014  . Left humeral fracture 06/06/2014  . Depression 06/06/2014  . Hypothyroidism 06/06/2014  . Overactive bladder 06/06/2014  . Normocytic anemia 06/06/2014  . Frequent falls   . Bleeding gastrointestinal     Past Surgical History:  Procedure Laterality Date  . ABDOMINAL HYSTERECTOMY    . ANKLE FRACTURE SURGERY Right 1988   "crushed it"  . CHOLECYSTECTOMY  1970's  . DILATION AND CURETTAGE OF UTERUS    . FRACTURE SURGERY    . JOINT REPLACEMENT    . TOTAL HIP ARTHROPLASTY Right 1980's  . TUBAL LIGATION      OB History    No data available       Home Medications    Prior to Admission medications     Medication Sig Start Date End Date Taking? Authorizing Provider  acetaminophen (TYLENOL) 650 MG CR tablet Take 650 mg by mouth every 8 (eight) hours as needed for pain.    [provider]  aspirin EC 81 MG tablet Take 1 tablet (81 mg total) by mouth daily. 03/29/17   Pricilla Loveless, MD  buPROPion (WELLBUTRIN) 75 MG tablet Take 37.5 mg by mouth 2 (two) times daily.    [provider]  calcium-vitamin D (OSCAL WITH D) 500-200 MG-UNIT tablet Take 1 tablet by mouth 2 (two) times daily.    [provider]  citalopram (CELEXA) 20 MG tablet Take 20 mg by mouth daily.    [provider]  divalproex (DEPAKOTE ER) 250 MG 24 hr tablet Take 1 tablet every night 03/27/17   Van Clines, MD  donepezil (ARICEPT) 10 MG tablet Take 10 mg by mouth at bedtime.    [provider]  hydrOXYzine (ATARAX/VISTARIL) 10 MG tablet Take 10 mg by mouth 2 (two) times daily as needed.    [provider]  iron polysaccharides (NIFEREX) 150 MG capsule Take 150 mg by mouth daily.    [provider]  levothyroxine (SYNTHROID, LEVOTHROID) 50 MCG tablet Take 50 mcg by  mouth daily before breakfast.    [provider]  MULTIPLE VITAMIN PO Take by mouth.    [provider]  nitrofurantoin, macrocrystal-monohydrate, (MACROBID) 100 MG capsule Take 1 capsule (100 mg total) by mouth 2 (two) times daily. 04/19/17   Rolland Porter, MD  pantoprazole (PROTONIX) 40 MG tablet Take 1 tablet (40 mg total) by mouth daily. 06/09/14   Rodolph Bong, MD  simvastatin (ZOCOR) 40 MG tablet Take 40 mg by mouth at bedtime.     [provider]    Family History Family History  Problem Relation Age of Onset  . Heart attack Father     Social History Social History   Tobacco Use  . Smoking status: Former Smoker    Packs/day: 2.00    Years: 40.00    Pack years: 80.00    Types: Cigarettes    Last attempt to quit: 05/05/1987    Years since quitting: 30.2  .  Smokeless tobacco: Never Used  Substance Use Topics  . Alcohol use: Yes    Alcohol/week: 4.2 oz    Types: 7 Glasses of wine per week  . Drug use: No     Allergies   Codeine and Penicillins   Review of Systems Review of Systems  Respiratory: Negative for shortness of breath.   Cardiovascular: Negative for chest pain.  Gastrointestinal: Negative for abdominal pain.  Genitourinary: Negative for dysuria.  Musculoskeletal: Positive for neck pain.       Shoulder pain  All other systems reviewed and are negative.    Physical Exam Updated Vital Signs BP (!) 170/72   Pulse 61   Temp 98.1 F (36.7 C) (Oral)   Resp 19   SpO2 (!) 87%   Physical Exam  Constitutional:  Elderly, no acute distress  HENT:  Head: Normocephalic and atraumatic.  Eyes: EOM are normal.  Pupils 1 mm reactive bilaterally  Neck:  C-collar in place, no midline C-spine tenderness to palpation, step-off, or deformity  Cardiovascular: Normal rate, regular rhythm and normal heart sounds.  Pulmonary/Chest: Effort normal and breath sounds normal. No respiratory distress. She has no wheezes.  Abdominal: Soft. Bowel sounds are normal. There is no tenderness.  Musculoskeletal:  Normal range of motion of the bilateral hips and knees, old scabbing noted over the left knee, no obvious deformities  Neurological: She is alert.  Oriented x2  Skin: Skin is warm and dry.  No contusions or abrasions noted  Psychiatric: She has a normal mood and affect.  Nursing note and vitals reviewed.    ED Treatments / Results  Labs (all labs ordered are listed, but only abnormal results are displayed) Labs Reviewed  URINALYSIS, ROUTINE W REFLEX MICROSCOPIC    EKG  EKG Interpretation None       Radiology No results found.  Procedures Procedures (including critical care time)  Medications Ordered in ED Medications - No data to display   Initial Impression / Assessment and Plan / ED Course  I have reviewed the  triage vital signs and the nursing notes.  Pertinent labs & imaging results that were available during my care of the patient were reviewed by me and considered in my medical decision making (see chart for details).     Patient presents after unwitnessed fall.  She is oriented x2.  No obvious external signs of trauma.  She was here several days ago for the same.  Repeat head imaging ordered given risk.  Additionally, urinalysis ordered given increased frequency of falls  recently.  Patient signed out to Dr. Criss AlvineGoldston.  Final Clinical Impressions(s) / ED Diagnoses   Final diagnoses:  Fall, initial encounter    ED Discharge Orders    None       Horton, Mayer Maskerourtney F, MD 07/23/17 [id number]

## 2017-07-23 NOTE — ED Notes (Signed)
Pt in radiology 

## 2017-07-23 NOTE — ED Notes (Signed)
Lab states urine quantity not enough for a UA, will save for possible culture later. Pt and family member informed of need for additional urine.

## 2017-07-23 NOTE — ED Notes (Signed)
[facility] [phone], no response.

## 2017-07-23 NOTE — ED Notes (Signed)
No changes, alert, NAD, calm, interactive[facility].

## 2017-07-23 NOTE — ED Triage Notes (Addendum)
[facility] [address] s/p fall, denies hitting head, head pain, blood thinner use, LOC or NV. Fall was unwitnessed. Last seen by staff at 0500. EMS contacted at 0600. Pt with dementia arrives flat, supine, A&Ox3, with c-collar in place. Pt states, "got up to go to the b/r, and slipped and fell".  VSS. CBG 138. [facility]   Alert, NAD, calm, interactive, resps e/u, speaking in clear complete sentences, talkative, polite, cooperative, answers questions appropriately, follows commands, no dyspnea noted, skin W&D. (denies: sob, nausea, dizziness or visual changes).

## 2017-07-24 ENCOUNTER — Emergency Department (HOSPITAL_COMMUNITY)
Admission: EM | Admit: 2017-07-24 | Discharge: 2017-07-24 | Disposition: A | Discharge status: Home / Self Care | Payer: Medicare Other | Attending: Emergency Medicine | Admitting: Emergency Medicine

## 2017-07-24 ENCOUNTER — Emergency Department (HOSPITAL_COMMUNITY): Payer: Medicare Other

## 2017-07-24 DIAGNOSIS — Z79899 Other long term (current) drug therapy: Secondary | ICD-10-CM | POA: Insufficient documentation

## 2017-07-24 DIAGNOSIS — R531 Weakness: Secondary | ICD-10-CM | POA: Diagnosis not present

## 2017-07-24 DIAGNOSIS — F0390 Unspecified dementia without behavioral disturbance: Secondary | ICD-10-CM | POA: Diagnosis not present

## 2017-07-24 DIAGNOSIS — F0391 Unspecified dementia with behavioral disturbance: Secondary | ICD-10-CM | POA: Diagnosis not present

## 2017-07-24 DIAGNOSIS — F330 Major depressive disorder, recurrent, mild: Secondary | ICD-10-CM | POA: Diagnosis not present

## 2017-07-24 DIAGNOSIS — Z96641 Presence of right artificial hip joint: Secondary | ICD-10-CM | POA: Insufficient documentation

## 2017-07-24 DIAGNOSIS — R4182 Altered mental status, unspecified: Secondary | ICD-10-CM | POA: Diagnosis not present

## 2017-07-24 DIAGNOSIS — R404 Transient alteration of awareness: Secondary | ICD-10-CM | POA: Diagnosis not present

## 2017-07-24 DIAGNOSIS — Z7982 Long term (current) use of aspirin: Secondary | ICD-10-CM | POA: Diagnosis not present

## 2017-07-24 DIAGNOSIS — I10 Essential (primary) hypertension: Secondary | ICD-10-CM | POA: Insufficient documentation

## 2017-07-24 DIAGNOSIS — R443 Hallucinations, unspecified: Secondary | ICD-10-CM | POA: Diagnosis not present

## 2017-07-24 DIAGNOSIS — E039 Hypothyroidism, unspecified: Secondary | ICD-10-CM | POA: Diagnosis not present

## 2017-07-24 DIAGNOSIS — Z87891 Personal history of nicotine dependence: Secondary | ICD-10-CM | POA: Insufficient documentation

## 2017-07-24 DIAGNOSIS — R410 Disorientation, unspecified: Secondary | ICD-10-CM

## 2017-07-24 LAB — URINALYSIS, ROUTINE W REFLEX MICROSCOPIC
Bacteria, UA: NONE SEEN
Bilirubin Urine: NEGATIVE
GLUCOSE, UA: NEGATIVE mg/dL
Hgb urine dipstick: NEGATIVE
Ketones, ur: NEGATIVE mg/dL
LEUKOCYTES UA: NEGATIVE
NITRITE: NEGATIVE
PH: 5 (ref 5.0–8.0)
Protein, ur: 30 mg/dL — AB
RBC / HPF: NONE SEEN RBC/hpf (ref 0–5)
SPECIFIC GRAVITY, URINE: 1.024 (ref 1.005–1.030)

## 2017-07-24 LAB — COMPREHENSIVE METABOLIC PANEL
ALT: 16 U/L (ref 14–54)
AST: 31 U/L (ref 15–41)
Albumin: 4.2 g/dL (ref 3.5–5.0)
Alkaline Phosphatase: 64 U/L (ref 38–126)
Anion gap: 9 (ref 5–15)
BILIRUBIN TOTAL: 1 mg/dL (ref 0.3–1.2)
BUN: 21 mg/dL — ABNORMAL HIGH (ref 6–20)
CALCIUM: 9.5 mg/dL (ref 8.9–10.3)
CO2: 27 mmol/L (ref 22–32)
CREATININE: 1.24 mg/dL — AB (ref 0.44–1.00)
Chloride: 102 mmol/L (ref 101–111)
GFR, EST AFRICAN AMERICAN: 44 mL/min — AB (ref >60)
GFR, EST NON AFRICAN AMERICAN: 38 mL/min — AB (ref >60)
Glucose, Bld: 120 mg/dL — ABNORMAL HIGH (ref 65–99)
Potassium: 3.2 mmol/L — ABNORMAL LOW (ref 3.5–5.1)
Sodium: 138 mmol/L (ref 135–145)
TOTAL PROTEIN: 7.3 g/dL (ref 6.5–8.1)

## 2017-07-24 LAB — CBC WITH DIFFERENTIAL/PLATELET
BASOS ABS: 0 10*3/uL (ref 0.0–0.1)
BASOS PCT: 0 %
EOS ABS: 0.1 10*3/uL (ref 0.0–0.7)
Eosinophils Relative: 1 %
HCT: 32.9 % — ABNORMAL LOW (ref 36.0–46.0)
Hemoglobin: 11 g/dL — ABNORMAL LOW (ref 12.0–15.0)
LYMPHS ABS: 1.9 10*3/uL (ref 0.7–4.0)
Lymphocytes Relative: 24 %
MCH: 31.6 pg (ref 26.0–34.0)
MCHC: 33.4 g/dL (ref 30.0–36.0)
MCV: 94.5 fL (ref 78.0–100.0)
Monocytes Absolute: 0.6 10*3/uL (ref 0.1–1.0)
Monocytes Relative: 8 %
NEUTROS PCT: 67 %
Neutro Abs: 5.1 10*3/uL (ref 1.7–7.7)
PLATELETS: 269 10*3/uL (ref 150–400)
RBC: 3.48 MIL/uL — AB (ref 3.87–5.11)
RDW: 13.1 % (ref 11.5–15.5)
WBC: 7.6 10*3/uL (ref 4.0–10.5)

## 2017-07-24 LAB — I-STAT TROPONIN, ED: Troponin i, poc: 0.02 ng/mL (ref 0.00–0.08)

## 2017-07-24 LAB — I-STAT CG4 LACTIC ACID, ED: LACTIC ACID, VENOUS: 0.71 mmol/L (ref 0.5–1.9)

## 2017-07-24 LAB — VALPROIC ACID LEVEL: Valproic Acid Lvl: <10 ug/mL — ABNORMAL LOW (ref 50.0–100.0)

## 2017-07-24 MED ORDER — SODIUM CHLORIDE 0.9 % IV BOLUS (SEPSIS)
1000.0000 mL | Freq: Once | INTRAVENOUS | Status: AC
  Administered 2017-07-24: 1000 mL via INTRAVENOUS

## 2017-07-24 NOTE — ED Provider Notes (Signed)
Medical screening examination/treatment/procedure(s) were conducted as a shared visit with non-physician practitioner(s) and myself.  I personally evaluated the patient during the encounter.   EKG Interpretation None     [age] year old female here with possible mental status changes times several days.  According to her relative, his symptoms have greatly improved since she stopped taking Depakote.  Workup here is without acute findings.  She is stable for discharge   Lorre NickAllen, Diamond Piano, MD 07/24/17 1429

## 2017-07-24 NOTE — ED Notes (Signed)
Bed: [id number] Expected date:  Expected time:  Means of arrival:  Comments: 84 yo AMS, recent med change

## 2017-07-24 NOTE — ED Provider Notes (Signed)
[facility]-EMERGENCY DEPT Provider Note   CSN: [id number] Arrival date & time: 07/24/17  1046     History   Chief Complaint Chief Complaint  Patient presents with  . Altered Mental Status    HPI Particia A Bales is a 84 y.o. female w/ h/o dementia, HTN, hypothyroidism here for evaluation of constellation of changes in behavior over 3 days. Has had two falls in the last week, evaluated in ED with normal work up and discharged. Pt states she feels "just fine" and is not sure why she is here. History obtained from chart review and daughter at bedside Florinda Marker daughter, [place]).   Recent medication change from depakote to abilify on 1/17. Since, pt has been hallucinating and forgetting people's names. This has not occurred in a long time since being on stable dose of depakote.  Two days ago, pt was agitated and fisty and having visual hallucinations and required risperidone which she uses prn.  Last night, pt was not walking at baseline and did not want to move her legs forward. Has been asleep since 4pm yesterday.  Daughter suspects this is related to medication change. Pt appears to be at baseline now.   No fevers, chills, cough, vomiting, diarrhea, slurred speech, facial asymmetry. Pt denies pain anywhere.    HPI  Past Medical History:  Diagnosis Date  . Arthritis    "everywhere"  . Dementia   . High cholesterol   . History of blood transfusion    "related to my hip OR"  . History of gout   . Hypertension   . Hypothyroidism   . Migraine    "q 5-6 years" (06/06/2014)    Patient Active Problem List   Diagnosis Date Noted  . High cholesterol   . UTI (lower urinary tract infection)   . Anemia 06/06/2014  . E. coli UTI 06/06/2014  . Generalized weakness 06/06/2014  . Left humeral fracture 06/06/2014  . Depression 06/06/2014  . Hypothyroidism 06/06/2014  . Overactive bladder 06/06/2014  . Normocytic anemia 06/06/2014  . Frequent falls   . Bleeding  gastrointestinal     Past Surgical History:  Procedure Laterality Date  . ABDOMINAL HYSTERECTOMY    . ANKLE FRACTURE SURGERY Right 1988   "crushed it"  . CHOLECYSTECTOMY  1970's  . DILATION AND CURETTAGE OF UTERUS    . FRACTURE SURGERY    . JOINT REPLACEMENT    . TOTAL HIP ARTHROPLASTY Right 1980's  . TUBAL LIGATION      OB History    No data available       Home Medications    Prior to Admission medications   Medication Sig Start Date End Date Taking? Authorizing Provider  acetaminophen (TYLENOL) 650 MG CR tablet Take 650 mg by mouth every 8 (eight) hours as needed for pain.   Yes [provider]  ARIPiprazole (ABILIFY) 5 MG tablet Take 2.5 mg by mouth daily.   Yes [provider]  aspirin EC 81 MG tablet Take 1 tablet (81 mg total) by mouth daily. 03/29/17  Yes Pricilla Loveless, MD  calcium-vitamin D (OSCAL WITH D) 500-200 MG-UNIT tablet Take 1 tablet by mouth 2 (two) times daily.   Yes [provider]  citalopram (CELEXA) 20 MG tablet Take 20 mg by mouth daily.   Yes [provider]  donepezil (ARICEPT) 10 MG tablet Take 10 mg by mouth at bedtime.   Yes [provider]  hydrocortisone 2.5 % cream Apply 1 application topically  2 (two) times daily.   Yes [provider]  iron polysaccharides (NIFEREX) 150 MG capsule Take 150 mg by mouth daily.   Yes [provider]  levothyroxine (SYNTHROID, LEVOTHROID) 50 MCG tablet Take 50 mcg by mouth daily before breakfast.   Yes [provider]  meloxicam (MOBIC) 7.5 MG tablet Take 7.5 mg by mouth every 12 (twelve) hours as needed for pain.   Yes [provider]  Multiple Vitamin (MULTIVITAMIN WITH MINERALS) TABS tablet Take 1 tablet by mouth daily.   Yes [provider]  MULTIPLE VITAMIN PO Take by mouth.   Yes [provider]  pantoprazole (PROTONIX) 40 MG tablet Take 1 tablet (40 mg total) by mouth daily. 06/09/14  Yes Rodolph Bong, MD    risperiDONE (RISPERDAL) 0.25 MG tablet Take 0.25 mg by mouth every 12 (twelve) hours as needed (aggitation).   Yes [provider]  simvastatin (ZOCOR) 40 MG tablet Take 40 mg by mouth at bedtime.    Yes [provider]  divalproex (DEPAKOTE ER) 250 MG 24 hr tablet Take 1 tablet every night Patient not taking: Reported on 07/24/2017 03/27/17   Van Clines, MD  nitrofurantoin, macrocrystal-monohydrate, (MACROBID) 100 MG capsule Take 1 capsule (100 mg total) by mouth 2 (two) times daily. Patient not taking: Reported on 07/24/2017 04/19/17   Rolland Porter, MD    Family History Family History  Problem Relation Age of Onset  . Heart attack Father     Social History Social History   Tobacco Use  . Smoking status: Former Smoker    Packs/day: 2.00    Years: 40.00    Pack years: 80.00    Types: Cigarettes    Last attempt to quit: 05/05/1987    Years since quitting: 30.2  . Smokeless tobacco: Never Used  Substance Use Topics  . Alcohol use: Yes    Alcohol/week: 4.2 oz    Types: 7 Glasses of wine per week  . Drug use: No     Allergies   Codeine and Penicillins   Review of Systems Review of Systems  Unable to perform ROS: Dementia  Constitutional: Positive for activity change. Negative for fever.  Respiratory: Negative for cough.   Gastrointestinal: Negative for constipation and vomiting.  Genitourinary: Negative for dysuria.  Neurological: Negative for headaches.  Psychiatric/Behavioral: Positive for confusion and hallucinations.     Physical Exam Updated Vital Signs BP (!) 137/57   Pulse 60   Temp 98.7 F (37.1 C) (Rectal)   Resp 16   Ht 5' (1.524 m)   Wt 49.9 kg (110 lb)   SpO2 94%   BMI 21.48 kg/m   Physical Exam  Constitutional: She is oriented to person, place, and time. She appears well-developed and well-nourished. No distress.  Nontoxic. Laying comfortably on bed. Alert and oriented to self, place and date of birth but not events leading  up to ED visit or year.  HENT:  Head: Normocephalic and atraumatic.  Nose: Nose normal.  Mouth/Throat: Oropharynx is clear and moist. No oropharyngeal exudate.  Eyes: Conjunctivae and EOM are normal. Pupils are equal, round, and reactive to light.  Neck: Normal range of motion. Neck supple.  Cardiovascular: Normal rate, regular rhythm, normal heart sounds and intact distal pulses.  No murmur heard. No tachycardia or hypotension. Intact distal pulses bilaterally. No lower extremity edema.  Pulmonary/Chest: Effort normal and breath sounds normal. She exhibits no tenderness.  Abdominal: Soft. Bowel sounds are normal. She exhibits no distension. There  is no tenderness.  No suprapubic or CVA tenderness.  Musculoskeletal: Normal range of motion. She exhibits no deformity.  Lymphadenopathy:    She has no cervical adenopathy.  Neurological: She is alert and oriented to person, place, and time. No sensory deficit.  Speech is fluent without aphasia. Strength 5/5 with hand grip and ankle F/E.   Sensation to light touch intact in hands and feet. Slow but steady gait with 1 person assist (walks with walker at [place]). No pronator drift.  Normal finger-to-nose. CN I and VIII not tested. CN II-XII intact bilaterally.   Skin: Skin is warm and dry. Capillary refill takes less than 2 seconds.  Psychiatric: She has a normal mood and affect. Her behavior is normal. Judgment and thought content normal.  Making jokes, laughing. Denies current SI, HI, AVH.   Nursing note and vitals reviewed.    ED Treatments / Results  Labs (all labs ordered are listed, but only abnormal results are displayed) Labs Reviewed  COMPREHENSIVE METABOLIC PANEL - Abnormal; Notable for the following components:      Result Value   Potassium 3.2 (*)    Glucose, Bld 120 (*)    BUN 21 (*)    Creatinine, Ser 1.24 (*)    GFR calc non Af Amer 38 (*)    GFR calc Af Amer 44 (*)    All other components within normal limits  CBC  WITH DIFFERENTIAL/PLATELET - Abnormal; Notable for the following components:   RBC 3.48 (*)    Hemoglobin 11.0 (*)    HCT 32.9 (*)    All other components within normal limits  URINALYSIS, ROUTINE W REFLEX MICROSCOPIC - Abnormal; Notable for the following components:   Protein, ur 30 (*)    Squamous Epithelial / LPF 0-5 (*)    All other components within normal limits  VALPROIC ACID LEVEL - Abnormal; Notable for the following components:   Valproic Acid Lvl <10 (*)    All other components within normal limits  URINE CULTURE  I-STAT CG4 LACTIC ACID, ED  I-STAT TROPONIN, ED    EKG  EKG Interpretation None       Radiology Dg Chest 2 View  Result Date: 07/24/2017 CLINICAL DATA:  Medication change 5 days ago with altered mental status since. Increased weakness over the past several days. EXAM: CHEST  2 VIEW COMPARISON:  PA and lateral chest x-ray of June 03, 2014 FINDINGS: The lungs are well-expanded. There is no focal infiltrate. There is no pleural effusion. The heart and pulmonary vascularity are normal. The mediastinum is normal in width. The trachea is midline. There calcification in the wall of the aortic arch. There is no pleural effusion. There degenerative changes of both shoulders and an old fracture of the proximal left humeral shaft. IMPRESSION: There is no active cardiopulmonary disease. Thoracic aortic atherosclerosis. Electronically Signed   By: David  [place]az[place] M.D.   On: 07/24/2017 12:19   Ct Head Wo Contrast  Result Date: 07/23/2017 CLINICAL DATA:  Pain following fall EXAM: CT HEAD WITHOUT CONTRAST CT CERVICAL SPINE WITHOUT CONTRAST TECHNIQUE: Multidetector CT imaging of the head and cervical spine was performed following the standard protocol without intravenous contrast. Multiplanar CT image reconstructions of the cervical spine were also generated. COMPARISON:  CT head and CT cervical spine July 17, 2017 FINDINGS: CT HEAD FINDINGS Brain: Moderate diffuse atrophy is  stable. There is no intracranial mass, hemorrhage, extra-axial fluid collection, or midline shift. There is small vessel disease throughout the centra semiovale bilaterally,  unchanged. Small vessel disease is apparent in the mid pons in the basilar perforator distribution. No acute infarct is demonstrable. Vascular: There is no appreciable hyperdense vessel. There is calcification in each carotid siphon. There is also mild calcification in each distal vertebral artery. Skull: Bony calvarium appears intact. Sinuses/Orbits: There is opacification in a posterior ethmoid air cell on the right. There is sphenoid sinus opacification on the left. There is mild mucosal thickening in several ethmoid air cells. Other paranasal sinuses are clear. Orbits appear symmetric bilaterally. Other: Mastoid air cells are clear. There is debris in the right external auditory canal. CT CERVICAL SPINE FINDINGS Alignment: There is no evidence spondylolisthesis. Skull base and vertebrae: Skull base and craniocervical junction regions appear normal. No evident fracture. There are no blastic or lytic bone lesions. Soft tissues and spinal canal: Prevertebral soft tissues and predental space regions are normal. There is no evident paraspinous lesion. No cord or canal hematoma is appreciable. Disc levels: There is moderate disc space narrowing at C5-6 and C6-7. There are prominent anterior osteophytes at all levels, most marked at C4, C5, and C6. There is exit foraminal narrowing at multiple levels due to bony hypertrophy. There is impression on the exiting nerve root due to bony hypertrophy at C3-4 on the right, at C5-6 bilaterally, and at C6-7 bilaterally. There is no frank disc extrusion or high-grade stenosis. Upper chest: Visualized upper lung zones are clear. Other: There is a mass in the right lobe of the thyroid measuring 1.3 x 1.3 cm, stable compared to recent study. There is calcification in both carotid arteries. IMPRESSION: CT head:  Stable atrophy with stable supratentorial and infratentorial small vessel disease. No acute infarct. No mass or hemorrhage. There are foci of arterial vascular calcification. There are areas of paranasal sinus disease. There is probable debris in the right external auditory canal. CT cervical spine: No fracture or spondylolisthesis. Multifocal osteoarthritic change noted. Stable 1.3 x 1.3 cm nodular lesion right lobe thyroid. Carotid artery calcification noted bilaterally. Electronically Signed   By: Bretta BangWilliam  Woodruff III M.D.   On: 07/23/2017 07:31   Ct Cervical Spine Wo Contrast  Result Date: 07/23/2017 CLINICAL DATA:  Pain following fall EXAM: CT HEAD WITHOUT CONTRAST CT CERVICAL SPINE WITHOUT CONTRAST TECHNIQUE: Multidetector CT imaging of the head and cervical spine was performed following the standard protocol without intravenous contrast. Multiplanar CT image reconstructions of the cervical spine were also generated. COMPARISON:  CT head and CT cervical spine July 17, 2017 FINDINGS: CT HEAD FINDINGS Brain: Moderate diffuse atrophy is stable. There is no intracranial mass, hemorrhage, extra-axial fluid collection, or midline shift. There is small vessel disease throughout the centra semiovale bilaterally, unchanged. Small vessel disease is apparent in the mid pons in the basilar perforator distribution. No acute infarct is demonstrable. Vascular: There is no appreciable hyperdense vessel. There is calcification in each carotid siphon. There is also mild calcification in each distal vertebral artery. Skull: Bony calvarium appears intact. Sinuses/Orbits: There is opacification in a posterior ethmoid air cell on the right. There is sphenoid sinus opacification on the left. There is mild mucosal thickening in several ethmoid air cells. Other paranasal sinuses are clear. Orbits appear symmetric bilaterally. Other: Mastoid air cells are clear. There is debris in the right external auditory canal. CT CERVICAL  SPINE FINDINGS Alignment: There is no evidence spondylolisthesis. Skull base and vertebrae: Skull base and craniocervical junction regions appear normal. No evident fracture. There are no blastic or lytic bone lesions. Soft tissues and  spinal canal: Prevertebral soft tissues and predental space regions are normal. There is no evident paraspinous lesion. No cord or canal hematoma is appreciable. Disc levels: There is moderate disc space narrowing at C5-6 and C6-7. There are prominent anterior osteophytes at all levels, most marked at C4, C5, and C6. There is exit foraminal narrowing at multiple levels due to bony hypertrophy. There is impression on the exiting nerve root due to bony hypertrophy at C3-4 on the right, at C5-6 bilaterally, and at C6-7 bilaterally. There is no frank disc extrusion or high-grade stenosis. Upper chest: Visualized upper lung zones are clear. Other: There is a mass in the right lobe of the thyroid measuring 1.3 x 1.3 cm, stable compared to recent study. There is calcification in both carotid arteries. IMPRESSION: CT head: Stable atrophy with stable supratentorial and infratentorial small vessel disease. No acute infarct. No mass or hemorrhage. There are foci of arterial vascular calcification. There are areas of paranasal sinus disease. There is probable debris in the right external auditory canal. CT cervical spine: No fracture or spondylolisthesis. Multifocal osteoarthritic change noted. Stable 1.3 x 1.3 cm nodular lesion right lobe thyroid. Carotid artery calcification noted bilaterally. Electronically Signed   By: Bretta Bang III M.D.   On: 07/23/2017 07:31    Procedures Procedures (including critical care time)  Medications Ordered in ED Medications  sodium chloride 0.9 % bolus 1,000 mL (0 mLs Intravenous Stopped 07/24/17 1230)     Initial Impression / Assessment and Plan / ED Course  I have reviewed the triage vital signs and the nursing notes.  Pertinent labs &  imaging results that were available during my care of the patient were reviewed by me and considered in my medical decision making (see chart for details).    84 year old female with history of dementia presents for constellation of changes in behavior x last 3 days. She has had 2 falls in the last week but cleared ED providers with negative CT scans. Recent Depakote was abruptly stopped and started Abilify one week ago. No systemic symptoms to suggest for infectious etiology. Exam is unremarkable. No neuro deficits, she is ambulating and mentating at baseline.  This is her third ED visit this week. Given risk, will initiate lab work.  Final Clinical Impressions(s) / ED Diagnoses   ED workup is unremarkable. There is no signs of infection, significant electrolyte abnormalities, vital signs have been normal. She's had 2 head CTs in the last week which have been normal and has not fallen since the last one she had. She has been observed in the ED for the past 4 hours and mentation and behavior have been unchanged. She has been ambulatory at baseline. She is tolerating fluids. I don't think further emergent lab work is indicated. Confusion and recent hallucinations and changes in behavior likely medication related. Her neuro exam is normal and I don't think that this is due to a central nervous system etiology. Might have a brewing infection that is not presenting itself yet or may be from expected decline in dementia. Urine and chest x-ray, white blood cell count and lactic normal today. We'll discharge at this time with close reevaluation by PCP for evaluation for clinical decline. Patient shared with the supervising physician. Final diagnoses:  Confusion    ED Discharge Orders    None       Liberty Handy, PA-C 07/24/17 1626

## 2017-07-24 NOTE — ED Notes (Signed)
Patient transported to X-ray 

## 2017-07-24 NOTE — Discharge Instructions (Signed)
Lab work, imaging, urine all looked okay today. Unsure of the cause for recent changes in behavior and confusion.   We suspect recent changes in medications may be contributing.   We recommend she follow up with primary care provider in the next 1-2 days for reevaluation and to determine clinical improvement.  Return for fevers, cough, chest pain, shortness of breath, vomiting, abdominal pain, urinary symptoms, diarrhea, passing out

## 2017-07-24 NOTE — ED Triage Notes (Signed)
Transported by [facility] from [facility]staff reports AMS since medication change which occurred last Thursday (Depakote was changed to Abilify). EMS reports that staff is A & O x 4 but reports increasing weakness over the last few days. Recently seen at this facility for same.

## 2017-07-25 DIAGNOSIS — G308 Other Alzheimer's disease: Secondary | ICD-10-CM | POA: Diagnosis not present

## 2017-07-25 DIAGNOSIS — I10 Essential (primary) hypertension: Secondary | ICD-10-CM | POA: Diagnosis not present

## 2017-07-25 DIAGNOSIS — R451 Restlessness and agitation: Secondary | ICD-10-CM | POA: Diagnosis not present

## 2017-07-25 DIAGNOSIS — E041 Nontoxic single thyroid nodule: Secondary | ICD-10-CM | POA: Diagnosis not present

## 2017-07-25 DIAGNOSIS — R233 Spontaneous ecchymoses: Secondary | ICD-10-CM | POA: Diagnosis not present

## 2017-07-25 DIAGNOSIS — R442 Other hallucinations: Secondary | ICD-10-CM | POA: Diagnosis not present

## 2017-07-25 DIAGNOSIS — R2689 Other abnormalities of gait and mobility: Secondary | ICD-10-CM | POA: Diagnosis not present

## 2017-07-25 LAB — URINE CULTURE: CULTURE: NO GROWTH

## 2017-07-26 DIAGNOSIS — F330 Major depressive disorder, recurrent, mild: Secondary | ICD-10-CM | POA: Diagnosis not present

## 2017-07-26 DIAGNOSIS — F419 Anxiety disorder, unspecified: Secondary | ICD-10-CM | POA: Diagnosis not present

## 2017-07-26 DIAGNOSIS — F411 Generalized anxiety disorder: Secondary | ICD-10-CM | POA: Diagnosis not present

## 2017-07-26 DIAGNOSIS — G308 Other Alzheimer's disease: Secondary | ICD-10-CM | POA: Diagnosis not present

## 2017-07-26 DIAGNOSIS — F418 Other specified anxiety disorders: Secondary | ICD-10-CM | POA: Diagnosis not present

## 2017-07-26 DIAGNOSIS — R4182 Altered mental status, unspecified: Secondary | ICD-10-CM | POA: Diagnosis not present

## 2017-07-27 DIAGNOSIS — F419 Anxiety disorder, unspecified: Secondary | ICD-10-CM | POA: Diagnosis not present

## 2017-07-27 DIAGNOSIS — G8929 Other chronic pain: Secondary | ICD-10-CM | POA: Diagnosis not present

## 2017-07-27 DIAGNOSIS — E039 Hypothyroidism, unspecified: Secondary | ICD-10-CM | POA: Diagnosis not present

## 2017-07-27 DIAGNOSIS — G3184 Mild cognitive impairment, so stated: Secondary | ICD-10-CM | POA: Diagnosis not present

## 2017-07-27 DIAGNOSIS — I2510 Atherosclerotic heart disease of native coronary artery without angina pectoris: Secondary | ICD-10-CM | POA: Diagnosis not present

## 2017-07-27 DIAGNOSIS — R21 Rash and other nonspecific skin eruption: Secondary | ICD-10-CM | POA: Diagnosis not present

## 2017-07-27 DIAGNOSIS — E785 Hyperlipidemia, unspecified: Secondary | ICD-10-CM | POA: Diagnosis not present

## 2017-07-27 DIAGNOSIS — K219 Gastro-esophageal reflux disease without esophagitis: Secondary | ICD-10-CM | POA: Diagnosis not present

## 2017-08-03 DIAGNOSIS — G3184 Mild cognitive impairment, so stated: Secondary | ICD-10-CM | POA: Diagnosis not present

## 2017-08-03 DIAGNOSIS — E039 Hypothyroidism, unspecified: Secondary | ICD-10-CM | POA: Diagnosis not present

## 2017-08-03 DIAGNOSIS — E119 Type 2 diabetes mellitus without complications: Secondary | ICD-10-CM | POA: Diagnosis not present

## 2017-08-03 DIAGNOSIS — D519 Vitamin B12 deficiency anemia, unspecified: Secondary | ICD-10-CM | POA: Diagnosis not present

## 2017-08-03 DIAGNOSIS — D509 Iron deficiency anemia, unspecified: Secondary | ICD-10-CM | POA: Diagnosis not present

## 2017-08-03 DIAGNOSIS — E785 Hyperlipidemia, unspecified: Secondary | ICD-10-CM | POA: Diagnosis not present

## 2017-08-03 DIAGNOSIS — E559 Vitamin D deficiency, unspecified: Secondary | ICD-10-CM | POA: Diagnosis not present

## 2017-08-03 DIAGNOSIS — I10 Essential (primary) hypertension: Secondary | ICD-10-CM | POA: Diagnosis not present

## 2017-08-18 DIAGNOSIS — Z79899 Other long term (current) drug therapy: Secondary | ICD-10-CM | POA: Diagnosis not present

## 2017-08-28 DIAGNOSIS — F419 Anxiety disorder, unspecified: Secondary | ICD-10-CM | POA: Diagnosis not present

## 2017-08-28 DIAGNOSIS — F0281 Dementia in other diseases classified elsewhere with behavioral disturbance: Secondary | ICD-10-CM | POA: Diagnosis not present

## 2017-08-28 DIAGNOSIS — F333 Major depressive disorder, recurrent, severe with psychotic symptoms: Secondary | ICD-10-CM | POA: Diagnosis not present

## 2017-08-28 DIAGNOSIS — F39 Unspecified mood [affective] disorder: Secondary | ICD-10-CM | POA: Diagnosis not present

## 2017-08-29 DIAGNOSIS — I2510 Atherosclerotic heart disease of native coronary artery without angina pectoris: Secondary | ICD-10-CM | POA: Diagnosis not present

## 2017-08-29 DIAGNOSIS — G3184 Mild cognitive impairment, so stated: Secondary | ICD-10-CM | POA: Diagnosis not present

## 2017-08-29 DIAGNOSIS — E039 Hypothyroidism, unspecified: Secondary | ICD-10-CM | POA: Diagnosis not present

## 2017-08-29 DIAGNOSIS — F419 Anxiety disorder, unspecified: Secondary | ICD-10-CM | POA: Diagnosis not present

## 2017-08-29 DIAGNOSIS — G8929 Other chronic pain: Secondary | ICD-10-CM | POA: Diagnosis not present

## 2017-08-29 DIAGNOSIS — E785 Hyperlipidemia, unspecified: Secondary | ICD-10-CM | POA: Diagnosis not present

## 2017-08-29 DIAGNOSIS — R21 Rash and other nonspecific skin eruption: Secondary | ICD-10-CM | POA: Diagnosis not present

## 2017-08-29 DIAGNOSIS — K219 Gastro-esophageal reflux disease without esophagitis: Secondary | ICD-10-CM | POA: Diagnosis not present

## 2017-08-30 DIAGNOSIS — I2510 Atherosclerotic heart disease of native coronary artery without angina pectoris: Secondary | ICD-10-CM | POA: Diagnosis not present

## 2017-08-30 DIAGNOSIS — E559 Vitamin D deficiency, unspecified: Secondary | ICD-10-CM | POA: Diagnosis not present

## 2017-08-30 DIAGNOSIS — R21 Rash and other nonspecific skin eruption: Secondary | ICD-10-CM | POA: Diagnosis not present

## 2017-08-30 DIAGNOSIS — E039 Hypothyroidism, unspecified: Secondary | ICD-10-CM | POA: Diagnosis not present

## 2017-08-30 DIAGNOSIS — E785 Hyperlipidemia, unspecified: Secondary | ICD-10-CM | POA: Diagnosis not present

## 2017-08-30 DIAGNOSIS — G8929 Other chronic pain: Secondary | ICD-10-CM | POA: Diagnosis not present

## 2017-08-30 DIAGNOSIS — G3184 Mild cognitive impairment, so stated: Secondary | ICD-10-CM | POA: Diagnosis not present

## 2017-08-30 DIAGNOSIS — R197 Diarrhea, unspecified: Secondary | ICD-10-CM | POA: Diagnosis not present

## 2017-08-30 DIAGNOSIS — K219 Gastro-esophageal reflux disease without esophagitis: Secondary | ICD-10-CM | POA: Diagnosis not present

## 2017-08-30 DIAGNOSIS — F419 Anxiety disorder, unspecified: Secondary | ICD-10-CM | POA: Diagnosis not present

## 2017-08-30 DIAGNOSIS — D631 Anemia in chronic kidney disease: Secondary | ICD-10-CM | POA: Diagnosis not present

## 2017-08-31 DIAGNOSIS — F419 Anxiety disorder, unspecified: Secondary | ICD-10-CM | POA: Diagnosis not present

## 2017-08-31 DIAGNOSIS — F333 Major depressive disorder, recurrent, severe with psychotic symptoms: Secondary | ICD-10-CM | POA: Diagnosis not present

## 2017-08-31 DIAGNOSIS — Z5181 Encounter for therapeutic drug level monitoring: Secondary | ICD-10-CM | POA: Diagnosis not present

## 2017-08-31 DIAGNOSIS — F0281 Dementia in other diseases classified elsewhere with behavioral disturbance: Secondary | ICD-10-CM | POA: Diagnosis not present

## 2017-09-06 DIAGNOSIS — E559 Vitamin D deficiency, unspecified: Secondary | ICD-10-CM | POA: Diagnosis not present

## 2017-09-11 DIAGNOSIS — F419 Anxiety disorder, unspecified: Secondary | ICD-10-CM | POA: Diagnosis not present

## 2017-09-11 DIAGNOSIS — F0281 Dementia in other diseases classified elsewhere with behavioral disturbance: Secondary | ICD-10-CM | POA: Diagnosis not present

## 2017-09-11 DIAGNOSIS — F39 Unspecified mood [affective] disorder: Secondary | ICD-10-CM | POA: Diagnosis not present

## 2017-09-11 DIAGNOSIS — F333 Major depressive disorder, recurrent, severe with psychotic symptoms: Secondary | ICD-10-CM | POA: Diagnosis not present

## 2017-09-14 DIAGNOSIS — E041 Nontoxic single thyroid nodule: Secondary | ICD-10-CM | POA: Diagnosis not present

## 2017-09-23 ENCOUNTER — Emergency Department (HOSPITAL_COMMUNITY): Payer: Medicare Other

## 2017-09-23 ENCOUNTER — Encounter (HOSPITAL_COMMUNITY): Payer: Self-pay

## 2017-09-23 ENCOUNTER — Emergency Department (HOSPITAL_COMMUNITY)
Admission: EM | Admit: 2017-09-23 | Discharge: 2017-09-23 | Disposition: A | Discharge status: Home / Self Care | Payer: Medicare Other | Attending: Emergency Medicine | Admitting: Emergency Medicine

## 2017-09-23 ENCOUNTER — Other Ambulatory Visit: Payer: Self-pay

## 2017-09-23 DIAGNOSIS — W08XXXA Fall from other furniture, initial encounter: Secondary | ICD-10-CM | POA: Insufficient documentation

## 2017-09-23 DIAGNOSIS — Y92009 Unspecified place in unspecified non-institutional (private) residence as the place of occurrence of the external cause: Secondary | ICD-10-CM | POA: Insufficient documentation

## 2017-09-23 DIAGNOSIS — W19XXXA Unspecified fall, initial encounter: Secondary | ICD-10-CM

## 2017-09-23 DIAGNOSIS — T1490XA Injury, unspecified, initial encounter: Secondary | ICD-10-CM | POA: Diagnosis not present

## 2017-09-23 DIAGNOSIS — Z96641 Presence of right artificial hip joint: Secondary | ICD-10-CM | POA: Insufficient documentation

## 2017-09-23 DIAGNOSIS — Y939 Activity, unspecified: Secondary | ICD-10-CM | POA: Insufficient documentation

## 2017-09-23 DIAGNOSIS — Z79899 Other long term (current) drug therapy: Secondary | ICD-10-CM | POA: Insufficient documentation

## 2017-09-23 DIAGNOSIS — S0990XA Unspecified injury of head, initial encounter: Secondary | ICD-10-CM | POA: Insufficient documentation

## 2017-09-23 DIAGNOSIS — Z7982 Long term (current) use of aspirin: Secondary | ICD-10-CM | POA: Insufficient documentation

## 2017-09-23 DIAGNOSIS — I10 Essential (primary) hypertension: Secondary | ICD-10-CM | POA: Diagnosis not present

## 2017-09-23 DIAGNOSIS — F0390 Unspecified dementia without behavioral disturbance: Secondary | ICD-10-CM | POA: Insufficient documentation

## 2017-09-23 DIAGNOSIS — E039 Hypothyroidism, unspecified: Secondary | ICD-10-CM | POA: Diagnosis not present

## 2017-09-23 DIAGNOSIS — Y999 Unspecified external cause status: Secondary | ICD-10-CM | POA: Insufficient documentation

## 2017-09-23 DIAGNOSIS — Z87891 Personal history of nicotine dependence: Secondary | ICD-10-CM | POA: Insufficient documentation

## 2017-09-23 MED ORDER — ACETAMINOPHEN 500 MG PO TABS
1000.0000 mg | ORAL_TABLET | Freq: Once | ORAL | Status: DC
  Filled 2017-09-23: qty 2

## 2017-09-23 NOTE — ED Notes (Signed)
Patient left at this time with all belongings. 

## 2017-09-23 NOTE — ED Triage Notes (Signed)
Pt BIB [facility] for eval of slip and fall off of a couch. Pt reports she turned to adjust something on the couch and slipped back, striking her head on a refrigerator. No obvious trauma, injuries. Denies LOC. Pt denies pain.

## 2017-09-23 NOTE — ED Provider Notes (Addendum)
[facility] EMERGENCY DEPARTMENT Provider Note   CSN: [id number] Arrival date & time: 09/23/17  2013     History   Chief Complaint Chief Complaint  Patient presents with  . Fall    HPI Diamond [name] is a 84 y.o. female.  HPI Patient presents to the emergency department following a fall that occurred just prior to arrival.  The patient lives in a facility where she states she was cleaning up the dishes from her dinner and she slipped off the couch and fell backwards hitting her head on the refrigerator the patient states that she remembers the fall event and states that she does not have any pain anywhere.  The patient states that she does not have any headache or blurred vision.  She denies any neck pain.  Patient states that she did not take any medications prior to arrival.  Patient  denies chest pain, shortness of breath, nausea, vomiting, abdominal pain, back pain, neck pain, blurred vision, weakness, dizziness, near-syncope or syncope. Past Medical History:  Diagnosis Date  . Arthritis    "everywhere"  . Dementia   . High cholesterol   . History of blood transfusion    "related to my hip OR"  . History of gout   . Hypertension   . Hypothyroidism   . Migraine    "q 5-6 years" (06/06/2014)    Patient Active Problem List   Diagnosis Date Noted  . High cholesterol   . UTI (lower urinary tract infection)   . Anemia 06/06/2014  . E. coli UTI 06/06/2014  . Generalized weakness 06/06/2014  . Left humeral fracture 06/06/2014  . Depression 06/06/2014  . Hypothyroidism 06/06/2014  . Overactive bladder 06/06/2014  . Normocytic anemia 06/06/2014  . Frequent falls   . Bleeding gastrointestinal     Past Surgical History:  Procedure Laterality Date  . ABDOMINAL HYSTERECTOMY    . ANKLE FRACTURE SURGERY Right 1988   "crushed it"  . CHOLECYSTECTOMY  1970's  . DILATION AND CURETTAGE OF UTERUS    . FRACTURE SURGERY    . JOINT REPLACEMENT    . TOTAL  HIP ARTHROPLASTY Right 1980's  . TUBAL LIGATION       OB History   None      Home Medications    Prior to Admission medications   Medication Sig Start Date End Date Taking? Authorizing Provider  acetaminophen (TYLENOL) 650 MG CR tablet Take 650 mg by mouth every 8 (eight) hours as needed for pain.    [provider]  ARIPiprazole (ABILIFY) 5 MG tablet Take 2.5 mg by mouth daily.    [provider]  aspirin EC 81 MG tablet Take 1 tablet (81 mg total) by mouth daily. 03/29/17   Pricilla Loveless, MD  calcium-vitamin D (OSCAL WITH D) 500-200 MG-UNIT tablet Take 1 tablet by mouth 2 (two) times daily.    [provider]  citalopram (CELEXA) 20 MG tablet Take 20 mg by mouth daily.    [provider]  divalproex (DEPAKOTE ER) 250 MG 24 hr tablet Take 1 tablet every night Patient not taking: Reported on 07/24/2017 03/27/17   Van Clines, MD  donepezil (ARICEPT) 10 MG tablet Take 10 mg by mouth at bedtime.    [provider]  hydrocortisone 2.5 % cream Apply 1 application topically 2 (two) times daily.    [provider]  iron polysaccharides (NIFEREX) 150 MG capsule Take 150 mg by mouth daily.    [provider]  levothyroxine (SYNTHROID, LEVOTHROID) 50 MCG tablet Take 50 mcg by mouth daily before breakfast.    [provider]  meloxicam (MOBIC) 7.5 MG tablet Take 7.5 mg by mouth every 12 (twelve) hours as needed for pain.    [provider]  Multiple Vitamin (MULTIVITAMIN WITH MINERALS) TABS tablet Take 1 tablet by mouth daily.    [provider]  MULTIPLE VITAMIN PO Take by mouth.    [provider]  nitrofurantoin, macrocrystal-monohydrate, (MACROBID) 100 MG capsule Take 1 capsule (100 mg total) by mouth 2 (two) times daily. Patient not taking: Reported on 07/24/2017 04/19/17   Rolland Porter, MD  pantoprazole (PROTONIX) 40 MG tablet Take 1 tablet (40 mg total) by mouth daily. 06/09/14   Rodolph Bong, MD  risperiDONE (RISPERDAL) 0.25 MG tablet Take 0.25 mg by mouth every 12 (twelve) hours as needed (aggitation).    [provider]  simvastatin (ZOCOR) 40 MG tablet Take 40 mg by mouth at bedtime.     [provider]    Family History Family History  Problem Relation Age of Onset  . Heart attack Father     Social History Social History   Tobacco Use  . Smoking status: Former Smoker    Packs/day: 2.00    Years: 40.00    Pack years: 80.00    Types: Cigarettes    Last attempt to quit: 05/05/1987    Years since quitting: 30.4  . Smokeless tobacco: Never Used  Substance Use Topics  . Alcohol use: Yes    Alcohol/week: 4.2 oz    Types: 7 Glasses of wine per week  . Drug use: No     Allergies   Codeine and Penicillins   Review of Systems Review of Systems All other systems negative except as documented in the HPI. All pertinent positives and negatives as reviewed in the HPI.  Physical Exam Updated Vital Signs BP (!) 137/58 (BP Location: Right Arm)   Pulse 61   Temp 98.4 F (36.9 C) (Oral)   Resp 14   Ht 5' (1.524 m)   Wt 50.3 kg (111 lb)   SpO2 100%   BMI 21.68 kg/m   Physical Exam  Constitutional: She is oriented to person, place, and time. She appears well-developed and well-nourished. No distress.  HENT:  Head: Normocephalic and atraumatic.  Mouth/Throat: Oropharynx is clear and moist.  Eyes: Pupils are equal, round, and reactive to light.  Neck: Normal range of motion. Neck supple.  Cardiovascular: Normal rate, regular rhythm and normal heart sounds. Exam reveals no gallop and no friction rub.  No murmur heard. Pulmonary/Chest: Effort normal and breath sounds normal. No respiratory distress. She has no wheezes.  Neurological: She is alert and oriented to person, place, and time. She exhibits normal muscle tone. Coordination normal.  Skin: Skin is warm and dry. Capillary refill takes less than 2 seconds. No rash noted. No erythema.   Psychiatric: She has a normal mood and affect. Her behavior is normal.  Nursing note and vitals reviewed.    ED Treatments / Results  Labs (all labs ordered are listed, but only abnormal results are displayed) Labs Reviewed - No data to display  EKG None  Radiology No results found.  Procedures Procedures (including critical care time)  Medications Ordered in ED Medications - No data to display   Initial Impression / Assessment and Plan / ED Course  I have reviewed the triage vital signs and the nursing notes.  Pertinent labs & imaging results that were available during my care of the patient were reviewed by me and considered in my medical decision making (see chart for details).     Patient has no apparent injuries on full examination.  Patient states she really does not want to be here but understands that if the facility's policy.  Patient did not have a syncopal event and this seems like a mechanical type fall.  Patient is advised to return here as needed told to follow-up with her primary doctor patient agrees the plan and all questions were answered. Final Clinical Impressions(s) / ED Diagnoses   Final diagnoses:  None    ED Discharge Orders    None       Charlestine NightLawyer, Herbie Lehrmann, PA-C 09/23/17 2113    Charlestine NightLawyer, Torie Towle, PA-C 09/23/17 2116    Jacalyn LefevreHaviland, Julie, MD 09/24/17 [phone]

## 2017-09-23 NOTE — Discharge Instructions (Addendum)
Return here as needed.  Follow-up with your primary doctor.  Tylenol for any headache

## 2017-09-25 ENCOUNTER — Other Ambulatory Visit: Payer: Self-pay | Admitting: *Deleted

## 2017-09-25 NOTE — Patient Outreach (Signed)
[facility] Care Management  09/25/2017  Liddy A Liberati 1926/12/20 [id number]   Telephone Screen  Referral Date:  09/25/2017 Referral Source:  [facility] ED [facility] Reason for Referral:  6 or more ED visits in the past 6 months Insurance:  Medicare   Outreach Attempt:  Per chart review patient residing [facility].  RN Health Coach spoke with staff at [facility] and confirmed patient's residence and nursing services availability.  Plan:  Case being closed due to patient receiving other services.   Rhae LernerFarrah Autry Prust RN [facility] Care Management  RN Health Coach [phone] [email or web address]

## 2017-09-26 ENCOUNTER — Ambulatory Visit (INDEPENDENT_AMBULATORY_CARE_PROVIDER_SITE_OTHER): Payer: Medicare Other | Admitting: Neurology

## 2017-09-26 ENCOUNTER — Encounter: Payer: Self-pay | Admitting: Neurology

## 2017-09-26 ENCOUNTER — Other Ambulatory Visit: Payer: Self-pay

## 2017-09-26 VITALS — BP 130/74 | HR 61 | Ht 60.0 in | Wt 110.0 lb

## 2017-09-26 DIAGNOSIS — F0390 Unspecified dementia without behavioral disturbance: Secondary | ICD-10-CM

## 2017-09-26 DIAGNOSIS — F03A0 Unspecified dementia, mild, without behavioral disturbance, psychotic disturbance, mood disturbance, and anxiety: Secondary | ICD-10-CM

## 2017-09-26 NOTE — Progress Notes (Signed)
NEUROLOGY FOLLOW UP OFFICE NOTE  Diamond [name] [id number] 1926-09-12  HISTORY OF PRESENT ILLNESS: I had the pleasure of seeing Diamond [name] in follow-up in the neurology clinic on 09/26/2017.  The patient was last seen 6 months ago for mild dementia and is again accompanied by her daughter Diamond [name] who helps supplement the history today. MOCA score 16/30 in September 2018. She is taking Donepezil 10mg  daily. On her last visit, family was reporting visual hallucinations keeping her up at night. At some point [facility] staff switched her to Abilify, her daughter reports this was horrible for her, she had more hallucinations of people coming to her room and more paranoia. She was put back on Depakote, daughter reports she is much better, she has not mentioned any hallucinations so far. No side effects on medication. Her daughter has noticed some change in memory, she is also more depressed. Her husband was moved to a different facility and she has not seen him since the beginning of the year. They talk on the phone, but due to both having dementia, it aggravates both of them. She reports her mood is sometimes good, sometimes really bad. She also endorses depression from being unable to see her husband, "we have been married for 28 years." She states she likes where she lives, she occasionally gets aggravated "when you are confined." Appetite is good. Her daughter is concerned she does not socialize much and stays in her room alone a lot. She needs help with bathing, independent with dressing. She uses her walker, she had a fall recently with her walker. She denies any headaches, dizziness, vision changes, focal numbness/tingling/weakness.   HPI 03/27/2017: This is a pleasant 84 yo RH woman with a history of hypertension, hyperlipidemia, hypothyroidism, gout, with worsening memory. She thinks her memory is pretty good. Her daughter started noticing memory changes since around January to March 2018, she  would forget little things and was moving a little slower. Short-term memory was mostly affected, her long-term memory is excellent. She has trouble with dates, names, phone numbers. Her husband of 47 years was moved to a Memory Care facility at [facility] last July, then she moved to assisted living last month. She does not like it there, stating she cannot get information about her husband, "nobody knows anything." She reports that since she has been there, she has not had a discussion with any doctor about her husband, "they are disorganized." When she was living at home, her daughter feels she was not taking her medications as prescribed, finding full bottles of medications such as the Donepezil at home. Medications are now being administered by staff at [facility]. She denies any side effects on Aricept 10mg  daily. Her daughter reports that the house had been clean and laundry was always done, but reports concern that her hygiene is not good. She refuses help from staff with dressing and bathing. She fusses at her husband and has become more agitated and irritable in the past 6-8 weeks. Her daughter is concerned about her negativity. They are also reporting visual hallucinations, she sees her husband in the room with her, even before moving to [place], while he was at rehab, she reported seeing him in the room. She is upset that someone was in her room at [facility] watching TV, they woke her up and cut her TV on. She asked who they were and they gave her a name, then she fell back to sleep. She states staff knocked on her door the next morning  and gave her her key, but she says it is always on her neck. Last night she dreamed her husband and daughter were in the room with her. Her daughter started taking care of bills after the patient's husband moved to Memory Care. She stopped driving 3-5 years ago. She denies leaving the stove on, her daughter reports one time the crockpot was left on. She states she still  plans to go back home and that she had help at home.   She denies any headaches, dizziness, diplopia, dysarthria, dysphagia, neck pain, focal numbness/tingling/weakness, bowel/bladder dysfunction. No anosmia, tremors, no recent falls. She denies any significant head injuries. No family history of dementia. She was in the ER after a fall in 06/2014 and had a head CT which I personally reviewed, no acute changes, there was diffuse atrophy and chronic microvascular disease.   PAST MEDICAL HISTORY: Past Medical History:  Diagnosis Date  . Arthritis    "everywhere"  . Dementia   . High cholesterol   . History of blood transfusion    "related to my hip OR"  . History of gout   . Hypertension   . Hypothyroidism   . Migraine    "q 5-6 years" (06/06/2014)    MEDICATIONS: Current Outpatient Medications on File Prior to Visit  Medication Sig Dispense Refill  . acetaminophen (TYLENOL) 650 MG CR tablet Take 650 mg by mouth every 8 (eight) hours as needed for pain.    Marland Kitchen aspirin EC 81 MG tablet Take 1 tablet (81 mg total) by mouth daily. 30 tablet 0  . calcium-vitamin D (OSCAL WITH D) 500-200 MG-UNIT tablet Take 1 tablet by mouth 2 (two) times daily.    . citalopram (CELEXA) 20 MG tablet Take 20 mg by mouth daily.    . divalproex (DEPAKOTE ER) 250 MG 24 hr tablet Take 1 tablet every night 30 tablet 6  . donepezil (ARICEPT) 10 MG tablet Take 10 mg by mouth at bedtime.    . hydrocortisone 2.5 % cream Apply 1 application topically 2 (two) times daily.    . iron polysaccharides (NIFEREX) 150 MG capsule Take 150 mg by mouth daily.    Marland Kitchen levothyroxine (SYNTHROID, LEVOTHROID) 50 MCG tablet Take 50 mcg by mouth daily before breakfast.    . meloxicam (MOBIC) 7.5 MG tablet Take 7.5 mg by mouth every 12 (twelve) hours as needed for pain.    . Multiple Vitamin (MULTIVITAMIN WITH MINERALS) TABS tablet Take 1 tablet by mouth daily.    . MULTIPLE VITAMIN PO Take by mouth.    . nitrofurantoin,  macrocrystal-monohydrate, (MACROBID) 100 MG capsule Take 1 capsule (100 mg total) by mouth 2 (two) times daily. 10 capsule 0  . pantoprazole (PROTONIX) 40 MG tablet Take 1 tablet (40 mg total) by mouth daily. 30 tablet 0  . risperiDONE (RISPERDAL) 0.25 MG tablet Take 0.25 mg by mouth every 12 (twelve) hours as needed (aggitation).    . simvastatin (ZOCOR) 40 MG tablet Take 40 mg by mouth at bedtime.      No current facility-administered medications on file prior to visit.     ALLERGIES: Allergies  Allergen Reactions  . Codeine Swelling    Lips  . Penicillins Swelling    Lips swelled with PCN; tolerating Cephalosporins 05/2014\ Has patient had a PCN reaction causing immediate rash, facial/tongue/throat swelling, SOB or lightheadedness with hypotension: No Has patient had a PCN reaction causing severe rash involving mucus membranes or skin necrosis: No Has patient had a PCN reaction  that required hospitalization: No Has patient had a PCN reaction occurring within the last 10 years: No If all of the above answers are "NO", then may proceed with Cephalosporin use.    FAMILY HISTORY: Family History  Problem Relation Age of Onset  . Heart attack Father     SOCIAL HISTORY: Social History   Socioeconomic History  . Marital status: Married    Spouse name: Not on file  . Number of children: Not on file  . Years of education: Not on file  . Highest education level: Not on file  Occupational History  . Not on file  Social Needs  . Financial resource strain: Not on file  . Food insecurity:    Worry: Not on file    Inability: Not on file  . Transportation needs:    Medical: Not on file    Non-medical: Not on file  Tobacco Use  . Smoking status: Former Smoker    Packs/day: 2.00    Years: 40.00    Pack years: 80.00    Types: Cigarettes    Last attempt to quit: 05/05/1987    Years since quitting: 30.4  . Smokeless tobacco: Never Used  Substance and Sexual Activity  . Alcohol  use: Yes    Alcohol/week: 4.2 oz    Types: 7 Glasses of wine per week  . Drug use: No  . Sexual activity: Not Currently  Lifestyle  . Physical activity:    Days per week: Not on file    Minutes per session: Not on file  . Stress: Not on file  Relationships  . Social connections:    Talks on phone: Not on file    Gets together: Not on file    Attends religious service: Not on file    Active member of club or organization: Not on file    Attends meetings of clubs or organizations: Not on file    Relationship status: Not on file  . Intimate partner violence:    Fear of current or ex partner: Not on file    Emotionally abused: Not on file    Physically abused: Not on file    Forced sexual activity: Not on file  Other Topics Concern  . Not on file  Social History Narrative  . Not on file    REVIEW OF SYSTEMS: Constitutional: No fevers, chills, or sweats, no generalized fatigue, change in appetite Eyes: No visual changes, double vision, eye pain Ear, nose and throat: No hearing loss, ear pain, nasal congestion, sore throat Cardiovascular: No chest pain, palpitations Respiratory:  No shortness of breath at rest or with exertion, wheezes GastrointestinaI: No nausea, vomiting, diarrhea, abdominal pain, fecal incontinence Genitourinary:  No dysuria, urinary retention or frequency Musculoskeletal:  No neck pain, back pain Integumentary: No rash, pruritus, skin lesions Neurological: as above Psychiatric: No depression, insomnia, anxiety Endocrine: No palpitations, fatigue, diaphoresis, mood swings, change in appetite, change in weight, increased thirst Hematologic/Lymphatic:  No anemia, purpura, petechiae. Allergic/Immunologic: no itchy/runny eyes, nasal congestion, recent allergic reactions, rashes  PHYSICAL EXAM: Vitals:   09/26/17 1518  BP: 130/74  Pulse: 61  SpO2: 98%   General: No acute distress, sitting in wheelchair Head:  Normocephalic/atraumatic Neck: supple, no  paraspinal tenderness, full range of motion Heart:  Regular rate and rhythm Lungs:  Clear to auscultation bilaterally Back: No paraspinal tenderness Skin/Extremities: No rash, no edema Neurological Exam: alert and oriented to person, place, month. No aphasia or dysarthria. Fund of knowledge is appropriate.  Recent and remote memory are impaired.  Attention and concentration are reduced.    Able to name objects and repeat phrases. CDT 0/5 MMSE - Mini Mental State Exam 09/26/2017  Orientation to time 1  Orientation to Place 5  Registration 3  Attention/ Calculation 2  Recall 0  Language- name 2 objects 2  Language- repeat 1  Language- follow 3 step command 3  Language- read & follow direction 1  Write a sentence 1  Copy design 0  Total score 19   Cranial nerves: Pupils equal, round, reactive to light.  Extraocular movements intact with no nystagmus. Visual fields full. Facial sensation intact. No facial asymmetry. Tongue, uvula, palate midline.  Motor: mild right cogwheeling, muscle strength 5/5 throughout with no pronator drift.  Sensation to light touch intact.  No extinction to double simultaneous stimulation.  Deep tendon reflexes +1 throughout, toes downgoing.  Finger to nose testing intact.  Gait not tested. She has occasional right hand resting tremor, no postural or action tremor noted, decreased finger taps on right.  IMPRESSION: This is an 84 yo RH woman with a history of hypertension, hyperlipidemia, hypothyroidism, gout, with mild dementia. MMSE today 19/30 (MOCA score 16/30 last September 2018). She is on Donepezil 10mg  daily. She is on Depakote for behavioral changes, seems to be doing well on this. She and her daughter endorse a lot of depression, she is on citalopram and [facility] and [facility] "when she feels like it." Discussed the importance of regular follow-up with Behavioral health. Discussed the importance of control of vascular risk factors,  physical exercise, and brain stimulation exercises (including socialization) with patient and daughter. She will follow-up in 6 months and knows to call for any changes.   Thank you for allowing me to participate in her care.  Please do not hesitate to call for any questions or concerns.  The duration of this appointment visit was 25 minutes of face-to-face time with the patient.  Greater than 50% of this time was spent in counseling, explanation of diagnosis, planning of further management, and coordination of care.   Patrcia Dolly, M.D.

## 2017-09-26 NOTE — Patient Instructions (Signed)
1. Continue Donepezil 10mg  daily 2. Continue Depakote and citalopram regularly 3. Follow-up in 6 months, call for any changes  FALL PRECAUTIONS: Be cautious when walking. Scan the area for obstacles that may increase the risk of trips and falls. When getting up in the mornings, sit up at the edge of the bed for a few minutes before getting out of bed. Consider elevating the bed at the head end to avoid drop of blood pressure when getting up. Walk always in a well-lit room (use night lights in the walls). Avoid area rugs or power cords from appliances in the middle of the walkways. Use a walker or a cane if necessary and consider physical therapy for balance exercise. Get your eyesight checked regularly.  FINANCIAL OVERSIGHT: Supervision, especially oversight when making financial decisions or transactions is also recommended.  HOME SAFETY: Consider the safety of the kitchen when operating appliances like stoves, microwave oven, and blender. Consider having supervision and share cooking responsibilities until no longer able to participate in those. Accidents with firearms and other hazards in the house should be identified and addressed as well.  DRIVING: Regarding driving, in patients with progressive memory problems, driving will be impaired. We advise to have someone else do the driving if trouble finding directions or if minor accidents are reported. Independent driving assessment is available to determine safety of driving.  ABILITY TO BE LEFT ALONE: If patient is unable to contact 911 operator, consider using LifeLine, or when the need is there, arrange for someone to stay with patients. Smoking is a fire hazard, consider supervision or cessation. Risk of wandering should be assessed by caregiver and if detected at any point, supervision and safe proof recommendations should be instituted.  MEDICATION SUPERVISION: Inability to self-administer medication needs to be constantly addressed. Implement  a mechanism to ensure safe administration of the medications.  RECOMMENDATIONS FOR ALL PATIENTS WITH MEMORY PROBLEMS: 1. Continue to exercise (Recommend 30 minutes of walking everyday, or 3 hours every week) 2. Increase social interactions - continue going to [place] and enjoy social gatherings with friends and family 3. Eat healthy, avoid fried foods and eat more fruits and vegetables 4. Maintain adequate blood pressure, blood sugar, and blood cholesterol level. Reducing the risk of stroke and cardiovascular disease also helps promoting better memory. 5. Avoid stressful situations. Live a simple life and avoid aggravations. Organize your time and prepare for the next day in anticipation. 6. Sleep well, avoid any interruptions of sleep and avoid any distractions in the bedroom that may interfere with adequate sleep quality 7. Avoid sugar, avoid sweets as there is a strong link between excessive sugar intake, diabetes, and cognitive impairment The Mediterranean diet has been shown to help patients reduce the risk of progressive memory disorders and reduces cardiovascular risk. This includes eating fish, eat fruits and green leafy vegetables, nuts like almonds and hazelnuts, walnuts, and also use olive oil. Avoid fast foods and fried foods as much as possible. Avoid sweets and sugar as sugar use has been linked to worsening of memory function.  There is always a concern of gradual progression of memory problems. If this is the case, then we may need to adjust level of care according to patient needs. Support, both to the patient and caregiver, should then be put into place.

## 2017-09-27 DIAGNOSIS — R21 Rash and other nonspecific skin eruption: Secondary | ICD-10-CM | POA: Diagnosis not present

## 2017-09-27 DIAGNOSIS — R0989 Other specified symptoms and signs involving the circulatory and respiratory systems: Secondary | ICD-10-CM | POA: Diagnosis not present

## 2017-09-27 DIAGNOSIS — R05 Cough: Secondary | ICD-10-CM | POA: Diagnosis not present

## 2017-09-27 DIAGNOSIS — R197 Diarrhea, unspecified: Secondary | ICD-10-CM | POA: Diagnosis not present

## 2017-09-27 DIAGNOSIS — E785 Hyperlipidemia, unspecified: Secondary | ICD-10-CM | POA: Diagnosis not present

## 2017-09-27 DIAGNOSIS — E039 Hypothyroidism, unspecified: Secondary | ICD-10-CM | POA: Diagnosis not present

## 2017-09-27 DIAGNOSIS — I2510 Atherosclerotic heart disease of native coronary artery without angina pectoris: Secondary | ICD-10-CM | POA: Diagnosis not present

## 2017-09-27 DIAGNOSIS — K219 Gastro-esophageal reflux disease without esophagitis: Secondary | ICD-10-CM | POA: Diagnosis not present

## 2017-09-27 DIAGNOSIS — G8929 Other chronic pain: Secondary | ICD-10-CM | POA: Diagnosis not present

## 2017-09-27 DIAGNOSIS — R296 Repeated falls: Secondary | ICD-10-CM | POA: Diagnosis not present

## 2017-09-27 DIAGNOSIS — G3184 Mild cognitive impairment, so stated: Secondary | ICD-10-CM | POA: Diagnosis not present

## 2017-09-27 DIAGNOSIS — D631 Anemia in chronic kidney disease: Secondary | ICD-10-CM | POA: Diagnosis not present

## 2017-09-27 DIAGNOSIS — E559 Vitamin D deficiency, unspecified: Secondary | ICD-10-CM | POA: Diagnosis not present

## 2017-09-28 DIAGNOSIS — R21 Rash and other nonspecific skin eruption: Secondary | ICD-10-CM | POA: Diagnosis not present

## 2017-09-28 DIAGNOSIS — E039 Hypothyroidism, unspecified: Secondary | ICD-10-CM | POA: Diagnosis not present

## 2017-09-28 DIAGNOSIS — F0281 Dementia in other diseases classified elsewhere with behavioral disturbance: Secondary | ICD-10-CM | POA: Diagnosis not present

## 2017-09-28 DIAGNOSIS — K219 Gastro-esophageal reflux disease without esophagitis: Secondary | ICD-10-CM | POA: Diagnosis not present

## 2017-09-28 DIAGNOSIS — F333 Major depressive disorder, recurrent, severe with psychotic symptoms: Secondary | ICD-10-CM | POA: Diagnosis not present

## 2017-09-28 DIAGNOSIS — E785 Hyperlipidemia, unspecified: Secondary | ICD-10-CM | POA: Diagnosis not present

## 2017-09-28 DIAGNOSIS — I2510 Atherosclerotic heart disease of native coronary artery without angina pectoris: Secondary | ICD-10-CM | POA: Diagnosis not present

## 2017-09-28 DIAGNOSIS — G8929 Other chronic pain: Secondary | ICD-10-CM | POA: Diagnosis not present

## 2017-09-28 DIAGNOSIS — E559 Vitamin D deficiency, unspecified: Secondary | ICD-10-CM | POA: Diagnosis not present

## 2017-09-28 DIAGNOSIS — G3184 Mild cognitive impairment, so stated: Secondary | ICD-10-CM | POA: Diagnosis not present

## 2017-09-28 DIAGNOSIS — F419 Anxiety disorder, unspecified: Secondary | ICD-10-CM | POA: Diagnosis not present

## 2017-09-28 DIAGNOSIS — F39 Unspecified mood [affective] disorder: Secondary | ICD-10-CM | POA: Diagnosis not present

## 2017-10-03 DIAGNOSIS — R300 Dysuria: Secondary | ICD-10-CM | POA: Diagnosis not present

## 2017-10-05 DIAGNOSIS — G8929 Other chronic pain: Secondary | ICD-10-CM | POA: Diagnosis not present

## 2017-10-05 DIAGNOSIS — R51 Headache: Secondary | ICD-10-CM | POA: Diagnosis not present

## 2017-10-05 DIAGNOSIS — E559 Vitamin D deficiency, unspecified: Secondary | ICD-10-CM | POA: Diagnosis not present

## 2017-10-05 DIAGNOSIS — R300 Dysuria: Secondary | ICD-10-CM | POA: Diagnosis not present

## 2017-10-06 DIAGNOSIS — G3184 Mild cognitive impairment, so stated: Secondary | ICD-10-CM | POA: Diagnosis not present

## 2017-10-06 DIAGNOSIS — N189 Chronic kidney disease, unspecified: Secondary | ICD-10-CM | POA: Diagnosis not present

## 2017-10-06 DIAGNOSIS — F329 Major depressive disorder, single episode, unspecified: Secondary | ICD-10-CM | POA: Diagnosis not present

## 2017-10-06 DIAGNOSIS — Z7982 Long term (current) use of aspirin: Secondary | ICD-10-CM | POA: Diagnosis not present

## 2017-10-06 DIAGNOSIS — Z9181 History of falling: Secondary | ICD-10-CM | POA: Diagnosis not present

## 2017-10-06 DIAGNOSIS — M109 Gout, unspecified: Secondary | ICD-10-CM | POA: Diagnosis not present

## 2017-10-06 DIAGNOSIS — R531 Weakness: Secondary | ICD-10-CM | POA: Diagnosis not present

## 2017-10-06 DIAGNOSIS — I129 Hypertensive chronic kidney disease with stage 1 through stage 4 chronic kidney disease, or unspecified chronic kidney disease: Secondary | ICD-10-CM | POA: Diagnosis not present

## 2017-10-06 DIAGNOSIS — D631 Anemia in chronic kidney disease: Secondary | ICD-10-CM | POA: Diagnosis not present

## 2017-10-06 DIAGNOSIS — R296 Repeated falls: Secondary | ICD-10-CM | POA: Diagnosis not present

## 2017-10-09 DIAGNOSIS — I129 Hypertensive chronic kidney disease with stage 1 through stage 4 chronic kidney disease, or unspecified chronic kidney disease: Secondary | ICD-10-CM | POA: Diagnosis not present

## 2017-10-09 DIAGNOSIS — F329 Major depressive disorder, single episode, unspecified: Secondary | ICD-10-CM | POA: Diagnosis not present

## 2017-10-09 DIAGNOSIS — G3184 Mild cognitive impairment, so stated: Secondary | ICD-10-CM | POA: Diagnosis not present

## 2017-10-09 DIAGNOSIS — M109 Gout, unspecified: Secondary | ICD-10-CM | POA: Diagnosis not present

## 2017-10-09 DIAGNOSIS — F0281 Dementia in other diseases classified elsewhere with behavioral disturbance: Secondary | ICD-10-CM | POA: Diagnosis not present

## 2017-10-09 DIAGNOSIS — R531 Weakness: Secondary | ICD-10-CM | POA: Diagnosis not present

## 2017-10-09 DIAGNOSIS — F419 Anxiety disorder, unspecified: Secondary | ICD-10-CM | POA: Diagnosis not present

## 2017-10-09 DIAGNOSIS — F39 Unspecified mood [affective] disorder: Secondary | ICD-10-CM | POA: Diagnosis not present

## 2017-10-09 DIAGNOSIS — F333 Major depressive disorder, recurrent, severe with psychotic symptoms: Secondary | ICD-10-CM | POA: Diagnosis not present

## 2017-10-09 DIAGNOSIS — R296 Repeated falls: Secondary | ICD-10-CM | POA: Diagnosis not present

## 2017-10-12 DIAGNOSIS — R531 Weakness: Secondary | ICD-10-CM | POA: Diagnosis not present

## 2017-10-12 DIAGNOSIS — F329 Major depressive disorder, single episode, unspecified: Secondary | ICD-10-CM | POA: Diagnosis not present

## 2017-10-12 DIAGNOSIS — G3184 Mild cognitive impairment, so stated: Secondary | ICD-10-CM | POA: Diagnosis not present

## 2017-10-12 DIAGNOSIS — M109 Gout, unspecified: Secondary | ICD-10-CM | POA: Diagnosis not present

## 2017-10-12 DIAGNOSIS — I129 Hypertensive chronic kidney disease with stage 1 through stage 4 chronic kidney disease, or unspecified chronic kidney disease: Secondary | ICD-10-CM | POA: Diagnosis not present

## 2017-10-12 DIAGNOSIS — R296 Repeated falls: Secondary | ICD-10-CM | POA: Diagnosis not present

## 2017-10-12 DIAGNOSIS — D509 Iron deficiency anemia, unspecified: Secondary | ICD-10-CM | POA: Diagnosis not present

## 2017-10-12 DIAGNOSIS — I10 Essential (primary) hypertension: Secondary | ICD-10-CM | POA: Diagnosis not present

## 2017-10-16 DIAGNOSIS — G3184 Mild cognitive impairment, so stated: Secondary | ICD-10-CM | POA: Diagnosis not present

## 2017-10-16 DIAGNOSIS — R531 Weakness: Secondary | ICD-10-CM | POA: Diagnosis not present

## 2017-10-16 DIAGNOSIS — I129 Hypertensive chronic kidney disease with stage 1 through stage 4 chronic kidney disease, or unspecified chronic kidney disease: Secondary | ICD-10-CM | POA: Diagnosis not present

## 2017-10-16 DIAGNOSIS — F329 Major depressive disorder, single episode, unspecified: Secondary | ICD-10-CM | POA: Diagnosis not present

## 2017-10-16 DIAGNOSIS — R296 Repeated falls: Secondary | ICD-10-CM | POA: Diagnosis not present

## 2017-10-16 DIAGNOSIS — M109 Gout, unspecified: Secondary | ICD-10-CM | POA: Diagnosis not present

## 2017-10-18 DIAGNOSIS — G3184 Mild cognitive impairment, so stated: Secondary | ICD-10-CM | POA: Diagnosis not present

## 2017-10-18 DIAGNOSIS — R531 Weakness: Secondary | ICD-10-CM | POA: Diagnosis not present

## 2017-10-18 DIAGNOSIS — R296 Repeated falls: Secondary | ICD-10-CM | POA: Diagnosis not present

## 2017-10-18 DIAGNOSIS — M109 Gout, unspecified: Secondary | ICD-10-CM | POA: Diagnosis not present

## 2017-10-18 DIAGNOSIS — I129 Hypertensive chronic kidney disease with stage 1 through stage 4 chronic kidney disease, or unspecified chronic kidney disease: Secondary | ICD-10-CM | POA: Diagnosis not present

## 2017-10-18 DIAGNOSIS — F329 Major depressive disorder, single episode, unspecified: Secondary | ICD-10-CM | POA: Diagnosis not present

## 2017-10-23 DIAGNOSIS — R531 Weakness: Secondary | ICD-10-CM | POA: Diagnosis not present

## 2017-10-23 DIAGNOSIS — G3184 Mild cognitive impairment, so stated: Secondary | ICD-10-CM | POA: Diagnosis not present

## 2017-10-23 DIAGNOSIS — F0281 Dementia in other diseases classified elsewhere with behavioral disturbance: Secondary | ICD-10-CM | POA: Diagnosis not present

## 2017-10-23 DIAGNOSIS — F333 Major depressive disorder, recurrent, severe with psychotic symptoms: Secondary | ICD-10-CM | POA: Diagnosis not present

## 2017-10-23 DIAGNOSIS — M109 Gout, unspecified: Secondary | ICD-10-CM | POA: Diagnosis not present

## 2017-10-23 DIAGNOSIS — F329 Major depressive disorder, single episode, unspecified: Secondary | ICD-10-CM | POA: Diagnosis not present

## 2017-10-23 DIAGNOSIS — I129 Hypertensive chronic kidney disease with stage 1 through stage 4 chronic kidney disease, or unspecified chronic kidney disease: Secondary | ICD-10-CM | POA: Diagnosis not present

## 2017-10-23 DIAGNOSIS — F419 Anxiety disorder, unspecified: Secondary | ICD-10-CM | POA: Diagnosis not present

## 2017-10-23 DIAGNOSIS — R296 Repeated falls: Secondary | ICD-10-CM | POA: Diagnosis not present

## 2017-10-25 DIAGNOSIS — R531 Weakness: Secondary | ICD-10-CM | POA: Diagnosis not present

## 2017-10-25 DIAGNOSIS — I129 Hypertensive chronic kidney disease with stage 1 through stage 4 chronic kidney disease, or unspecified chronic kidney disease: Secondary | ICD-10-CM | POA: Diagnosis not present

## 2017-10-25 DIAGNOSIS — R296 Repeated falls: Secondary | ICD-10-CM | POA: Diagnosis not present

## 2017-10-25 DIAGNOSIS — G3184 Mild cognitive impairment, so stated: Secondary | ICD-10-CM | POA: Diagnosis not present

## 2017-10-25 DIAGNOSIS — M109 Gout, unspecified: Secondary | ICD-10-CM | POA: Diagnosis not present

## 2017-10-25 DIAGNOSIS — F329 Major depressive disorder, single episode, unspecified: Secondary | ICD-10-CM | POA: Diagnosis not present

## 2017-10-26 DIAGNOSIS — R21 Rash and other nonspecific skin eruption: Secondary | ICD-10-CM | POA: Diagnosis not present

## 2017-10-26 DIAGNOSIS — E039 Hypothyroidism, unspecified: Secondary | ICD-10-CM | POA: Diagnosis not present

## 2017-10-26 DIAGNOSIS — G8929 Other chronic pain: Secondary | ICD-10-CM | POA: Diagnosis not present

## 2017-10-26 DIAGNOSIS — R05 Cough: Secondary | ICD-10-CM | POA: Diagnosis not present

## 2017-10-26 DIAGNOSIS — K219 Gastro-esophageal reflux disease without esophagitis: Secondary | ICD-10-CM | POA: Diagnosis not present

## 2017-10-26 DIAGNOSIS — E785 Hyperlipidemia, unspecified: Secondary | ICD-10-CM | POA: Diagnosis not present

## 2017-10-26 DIAGNOSIS — R197 Diarrhea, unspecified: Secondary | ICD-10-CM | POA: Diagnosis not present

## 2017-10-26 DIAGNOSIS — E559 Vitamin D deficiency, unspecified: Secondary | ICD-10-CM | POA: Diagnosis not present

## 2017-10-26 DIAGNOSIS — R296 Repeated falls: Secondary | ICD-10-CM | POA: Diagnosis not present

## 2017-10-26 DIAGNOSIS — I2510 Atherosclerotic heart disease of native coronary artery without angina pectoris: Secondary | ICD-10-CM | POA: Diagnosis not present

## 2017-10-26 DIAGNOSIS — G3184 Mild cognitive impairment, so stated: Secondary | ICD-10-CM | POA: Diagnosis not present

## 2017-10-26 DIAGNOSIS — D631 Anemia in chronic kidney disease: Secondary | ICD-10-CM | POA: Diagnosis not present

## 2017-10-30 DIAGNOSIS — M109 Gout, unspecified: Secondary | ICD-10-CM | POA: Diagnosis not present

## 2017-10-30 DIAGNOSIS — R296 Repeated falls: Secondary | ICD-10-CM | POA: Diagnosis not present

## 2017-10-30 DIAGNOSIS — F419 Anxiety disorder, unspecified: Secondary | ICD-10-CM | POA: Diagnosis not present

## 2017-10-30 DIAGNOSIS — F329 Major depressive disorder, single episode, unspecified: Secondary | ICD-10-CM | POA: Diagnosis not present

## 2017-10-30 DIAGNOSIS — E559 Vitamin D deficiency, unspecified: Secondary | ICD-10-CM | POA: Diagnosis not present

## 2017-10-30 DIAGNOSIS — F333 Major depressive disorder, recurrent, severe with psychotic symptoms: Secondary | ICD-10-CM | POA: Diagnosis not present

## 2017-10-30 DIAGNOSIS — F0281 Dementia in other diseases classified elsewhere with behavioral disturbance: Secondary | ICD-10-CM | POA: Diagnosis not present

## 2017-10-30 DIAGNOSIS — R21 Rash and other nonspecific skin eruption: Secondary | ICD-10-CM | POA: Diagnosis not present

## 2017-10-30 DIAGNOSIS — R531 Weakness: Secondary | ICD-10-CM | POA: Diagnosis not present

## 2017-10-30 DIAGNOSIS — I2510 Atherosclerotic heart disease of native coronary artery without angina pectoris: Secondary | ICD-10-CM | POA: Diagnosis not present

## 2017-10-30 DIAGNOSIS — E039 Hypothyroidism, unspecified: Secondary | ICD-10-CM | POA: Diagnosis not present

## 2017-10-30 DIAGNOSIS — G8929 Other chronic pain: Secondary | ICD-10-CM | POA: Diagnosis not present

## 2017-10-30 DIAGNOSIS — K219 Gastro-esophageal reflux disease without esophagitis: Secondary | ICD-10-CM | POA: Diagnosis not present

## 2017-10-30 DIAGNOSIS — I129 Hypertensive chronic kidney disease with stage 1 through stage 4 chronic kidney disease, or unspecified chronic kidney disease: Secondary | ICD-10-CM | POA: Diagnosis not present

## 2017-10-30 DIAGNOSIS — F39 Unspecified mood [affective] disorder: Secondary | ICD-10-CM | POA: Diagnosis not present

## 2017-10-30 DIAGNOSIS — G3184 Mild cognitive impairment, so stated: Secondary | ICD-10-CM | POA: Diagnosis not present

## 2017-10-30 DIAGNOSIS — E785 Hyperlipidemia, unspecified: Secondary | ICD-10-CM | POA: Diagnosis not present

## 2017-11-01 ENCOUNTER — Emergency Department (HOSPITAL_COMMUNITY)
Admission: EM | Admit: 2017-11-01 | Discharge: 2017-11-01 | Disposition: A | Discharge status: Home / Self Care | Payer: Medicare Other | Attending: Emergency Medicine | Admitting: Emergency Medicine

## 2017-11-01 DIAGNOSIS — F0390 Unspecified dementia without behavioral disturbance: Secondary | ICD-10-CM | POA: Insufficient documentation

## 2017-11-01 DIAGNOSIS — Z87891 Personal history of nicotine dependence: Secondary | ICD-10-CM | POA: Insufficient documentation

## 2017-11-01 DIAGNOSIS — I10 Essential (primary) hypertension: Secondary | ICD-10-CM | POA: Insufficient documentation

## 2017-11-01 DIAGNOSIS — Z7982 Long term (current) use of aspirin: Secondary | ICD-10-CM | POA: Diagnosis not present

## 2017-11-01 DIAGNOSIS — Z96641 Presence of right artificial hip joint: Secondary | ICD-10-CM | POA: Insufficient documentation

## 2017-11-01 DIAGNOSIS — R402441 Other coma, without documented Glasgow coma scale score, or with partial score reported, in the field [EMT or ambulance]: Secondary | ICD-10-CM | POA: Diagnosis not present

## 2017-11-01 DIAGNOSIS — K921 Melena: Secondary | ICD-10-CM | POA: Insufficient documentation

## 2017-11-01 DIAGNOSIS — Z79899 Other long term (current) drug therapy: Secondary | ICD-10-CM | POA: Diagnosis not present

## 2017-11-01 DIAGNOSIS — E039 Hypothyroidism, unspecified: Secondary | ICD-10-CM | POA: Insufficient documentation

## 2017-11-01 DIAGNOSIS — R195 Other fecal abnormalities: Secondary | ICD-10-CM

## 2017-11-01 LAB — CBC WITH DIFFERENTIAL/PLATELET
BASOS ABS: 0 10*3/uL (ref 0.0–0.1)
BASOS PCT: 0 %
Eosinophils Absolute: 0.3 10*3/uL (ref 0.0–0.7)
Eosinophils Relative: 5 %
HEMATOCRIT: 32.5 % — AB (ref 36.0–46.0)
Hemoglobin: 10.7 g/dL — ABNORMAL LOW (ref 12.0–15.0)
LYMPHS PCT: 24 %
Lymphs Abs: 1.9 10*3/uL (ref 0.7–4.0)
MCH: 31.7 pg (ref 26.0–34.0)
MCHC: 32.9 g/dL (ref 30.0–36.0)
MCV: 96.2 fL (ref 78.0–100.0)
Monocytes Absolute: 0.5 10*3/uL (ref 0.1–1.0)
Monocytes Relative: 7 %
NEUTROS ABS: 4.8 10*3/uL (ref 1.7–7.7)
Neutrophils Relative %: 64 %
Platelets: 226 10*3/uL (ref 150–400)
RBC: 3.38 MIL/uL — ABNORMAL LOW (ref 3.87–5.11)
RDW: 12.9 % (ref 11.5–15.5)
WBC: 7.6 10*3/uL (ref 4.0–10.5)

## 2017-11-01 LAB — COMPREHENSIVE METABOLIC PANEL
ALBUMIN: 3.8 g/dL (ref 3.5–5.0)
ALK PHOS: 57 U/L (ref 38–126)
ALT: 11 U/L — ABNORMAL LOW (ref 14–54)
AST: 24 U/L (ref 15–41)
Anion gap: 9 (ref 5–15)
BILIRUBIN TOTAL: 0.6 mg/dL (ref 0.3–1.2)
BUN: 22 mg/dL — ABNORMAL HIGH (ref 6–20)
CALCIUM: 9.5 mg/dL (ref 8.9–10.3)
CO2: 29 mmol/L (ref 22–32)
Chloride: 107 mmol/L (ref 101–111)
Creatinine, Ser: 1.45 mg/dL — ABNORMAL HIGH (ref 0.44–1.00)
GFR calc Af Amer: 36 mL/min — ABNORMAL LOW (ref >60)
GFR calc non Af Amer: 31 mL/min — ABNORMAL LOW (ref >60)
GLUCOSE: 107 mg/dL — AB (ref 65–99)
POTASSIUM: 4.5 mmol/L (ref 3.5–5.1)
Sodium: 145 mmol/L (ref 135–145)
TOTAL PROTEIN: 7.4 g/dL (ref 6.5–8.1)

## 2017-11-01 LAB — POC OCCULT BLOOD, ED: Fecal Occult Bld: NEGATIVE

## 2017-11-01 LAB — PROTIME-INR
INR: 0.95
Prothrombin Time: 12.6 seconds (ref 11.4–15.2)

## 2017-11-01 MED ORDER — SODIUM CHLORIDE 0.9 % IV SOLN
INTRAVENOUS | Status: DC
  Administered 2017-11-01: 12:00:00 via INTRAVENOUS

## 2017-11-01 MED ORDER — FAMOTIDINE IN NACL 20-0.9 MG/50ML-% IV SOLN
20.0000 mg | Freq: Two times a day (BID) | INTRAVENOUS | Status: DC
  Administered 2017-11-01: 20 mg via INTRAVENOUS
  Filled 2017-11-01: qty 50

## 2017-11-01 NOTE — Discharge Instructions (Addendum)
The work-up in the ER shows that the dark stools are not because of blood loss.  We suspect that the dark stools are appearing dark because of your medications.  However, test can be falsely negative -and therefore we ask you to continue keeping a close eye on the stools. If you do start noticing bloody stools or start developing weakness, shortness of breath with exertion, turned pale -return to the ER immediately for further work-up.

## 2017-11-01 NOTE — ED Triage Notes (Signed)
Transported by [employer] from [place] [employer]), pt has been experiencing dark tarry stools x 2-3 days and denies any other symptoms. AAO x 4.

## 2017-11-01 NOTE — ED Provider Notes (Signed)
[facility]   CSN: [id number] Arrival date & time: 11/01/17  1122     History   Chief Complaint Chief Complaint  Patient presents with  . Melena    HPI Diamond [name] is a 84 y.o. female.  HPI  84 year old female comes in with chief complaint of bloody stools.  Patient has history of hypertension and gout, she takes NSAIDs.  According to family patient has history of anemia and is on iron supplement as well.  Patient has had dark stools in the past, however the nursing home notes that her stools have gotten darker and therefore they sent her to the ED for further work-up.  Patient denies any history of upper GI or lower GI bleed and she does not have any abdominal pain, weakness.  Past Medical History:  Diagnosis Date  . Arthritis    "everywhere"  . Dementia   . High cholesterol   . History of blood transfusion    "related to my hip OR"  . History of gout   . Hypertension   . Hypothyroidism   . Migraine    "q 5-6 years" (06/06/2014)    Patient Active Problem List   Diagnosis Date Noted  . High cholesterol   . UTI (lower urinary tract infection)   . Anemia 06/06/2014  . E. coli UTI 06/06/2014  . Generalized weakness 06/06/2014  . Left humeral fracture 06/06/2014  . Depression 06/06/2014  . Hypothyroidism 06/06/2014  . Overactive bladder 06/06/2014  . Normocytic anemia 06/06/2014  . Frequent falls   . Bleeding gastrointestinal     Past Surgical History:  Procedure Laterality Date  . ABDOMINAL HYSTERECTOMY    . ANKLE FRACTURE SURGERY Right 1988   "crushed it"  . CHOLECYSTECTOMY  1970's  . DILATION AND CURETTAGE OF UTERUS    . FRACTURE SURGERY    . JOINT REPLACEMENT    . TOTAL HIP ARTHROPLASTY Right 1980's  . TUBAL LIGATION       OB History   None      Home Medications    Prior to Admission medications   Medication Sig Start Date End Date Taking? Authorizing Provider  acetaminophen (TYLENOL) 650  MG CR tablet Take 650 mg by mouth every 8 (eight) hours as needed for pain.   Yes [provider]  aspirin EC 81 MG tablet Take 1 tablet (81 mg total) by mouth daily. 03/29/17  Yes Pricilla Loveless, MD  calcium-vitamin D (OSCAL WITH D) 500-200 MG-UNIT tablet Take 1 tablet by mouth 2 (two) times daily.   Yes [provider]  Cholecalciferol (VITAMIN D) 2000 units CAPS Take 2,000 Units by mouth daily.   Yes [provider]  citalopram (CELEXA) 20 MG tablet Take 20 mg by mouth daily.   Yes [provider]  divalproex (DEPAKOTE SPRINKLE) 125 MG capsule Take 125 mg by mouth 2 (two) times daily.   Yes [provider]  donepezil (ARICEPT) 10 MG tablet Take 10 mg by mouth at bedtime.   Yes [provider]  guaiFENesin (ROBITUSSIN) 100 MG/5ML liquid Take 200 mg by mouth every 6 (six) hours as needed for cough.   Yes [provider]  hydrocortisone 2.5 % cream Apply 1 application topically 2 (two) times daily.   Yes [provider]  iron polysaccharides (NIFEREX) 150 MG capsule Take 150 mg by mouth daily.   Yes [provider]  levothyroxine (SYNTHROID, LEVOTHROID) 50 MCG tablet Take 50 mcg by mouth  daily before breakfast.   Yes [provider]  loperamide (IMODIUM A-D) 2 MG tablet Take 2 mg by mouth 4 (four) times daily as needed for diarrhea or loose stools.   Yes [provider]  meloxicam (MOBIC) 7.5 MG tablet Take 7.5 mg by mouth every 12 (twelve) hours as needed for pain.   Yes [provider]  Multiple Vitamin (MULTIVITAMIN WITH MINERALS) TABS tablet Take 1 tablet by mouth daily.   Yes [provider]  pantoprazole (PROTONIX) 40 MG tablet Take 1 tablet (40 mg total) by mouth daily. 06/09/14  Yes Rodolph Bong, MD  risperiDONE (RISPERDAL) 0.25 MG tablet Take 0.25 mg by mouth every 12 (twelve) hours as needed (aggitation).   Yes [provider]  simvastatin (ZOCOR) 40 MG tablet  Take 40 mg by mouth at bedtime.    Yes [provider]  divalproex (DEPAKOTE ER) 250 MG 24 hr tablet Take 1 tablet every night Patient not taking: Reported on 11/01/2017 03/27/17   Van Clines, MD  nitrofurantoin, macrocrystal-monohydrate, (MACROBID) 100 MG capsule Take 1 capsule (100 mg total) by mouth 2 (two) times daily. Patient not taking: Reported on 11/01/2017 04/19/17   Rolland Porter, MD    Family History Family History  Problem Relation Age of Onset  . Heart attack Father     Social History Social History   Tobacco Use  . Smoking status: Former Smoker    Packs/day: 2.00    Years: 40.00    Pack years: 80.00    Types: Cigarettes    Last attempt to quit: 05/05/1987    Years since quitting: 30.5  . Smokeless tobacco: Never Used  Substance Use Topics  . Alcohol use: Yes    Alcohol/week: 4.2 oz    Types: 7 Glasses of wine per week  . Drug use: No     Allergies   Codeine; Aripiprazole; Penicillins; Prednisone; and Tramadol   Review of Systems Review of Systems  Constitutional: Negative for activity change.  Gastrointestinal: Negative for abdominal pain, nausea and vomiting.  All other systems reviewed and are negative.    Physical Exam Updated Vital Signs BP (!) 166/53   Pulse (!) 58   Temp 97.7 F (36.5 C) (Oral)   Resp 17   SpO2 98%   Physical Exam  Constitutional: She is oriented to person, place, and time. She appears well-developed.  HENT:  Head: Normocephalic and atraumatic.  Eyes: EOM are normal.  Neck: Normal range of motion. Neck supple.  Cardiovascular: Normal rate.  Pulmonary/Chest: Effort normal.  Abdominal: Bowel sounds are normal.  Dark stool  Neurological: She is alert and oriented to person, place, and time.  Skin: Skin is warm and dry.  Nursing note and vitals reviewed.    ED Treatments / Results  Labs (all labs ordered are listed, but only abnormal results are displayed) Labs Reviewed  COMPREHENSIVE METABOLIC PANEL -  Abnormal; Notable for the following components:      Result Value   Glucose, Bld 107 (*)    BUN 22 (*)    Creatinine, Ser 1.45 (*)    ALT 11 (*)    GFR calc non Af Amer 31 (*)    GFR calc Af Amer 36 (*)    All other components within normal limits  CBC WITH DIFFERENTIAL/PLATELET - Abnormal; Notable for the following components:   RBC 3.38 (*)    Hemoglobin 10.7 (*)    HCT 32.5 (*)    All other components within  normal limits  PROTIME-INR  POC OCCULT BLOOD, ED  TYPE AND SCREEN    EKG None  Radiology No results found.  Procedures Procedures (including critical care time)  Medications Ordered in ED Medications  0.9 %  sodium chloride infusion ( Intravenous Stopped 11/01/17 1518)  famotidine (PEPCID) IVPB 20 mg premix (0 mg Intravenous Stopped 11/01/17 1239)     Initial Impression / Assessment and Plan / ED Course  I have reviewed the triage vital signs and the nursing notes.  Pertinent labs & imaging results that were available during my care of the patient were reviewed by me and considered in my medical decision making (see chart for details).     84 year old female comes in with chief complaint of dark tarry stools.  Patient is on iron supplement and has had dark stools before, however she reports that the stools have gotten darker recently.  Patient is not symptomatic from anemia.  Labs show that her hemoglobin is good at baseline and the BUN is just mildly elevated.  On digital rectal exam patient is noted to have dark tarry stools, however it is guaiac negative.  I informed patient that the test could have false negative, however, at this time we are comfortable sending patient home.  She will return to the ER immediately if she starts having bloody stool or she started getting weak, dizzy or short of breath.  Final Clinical Impressions(s) / ED Diagnoses   Final diagnoses:  Dark stools    ED Discharge Orders    None       Derwood Kaplan, MD 11/01/17 1653

## 2017-11-01 NOTE — ED Notes (Signed)
Bed: QM57 Expected date:  Expected time:  Means of arrival:  Comments: EMS/elderly/melena

## 2017-11-02 DIAGNOSIS — D631 Anemia in chronic kidney disease: Secondary | ICD-10-CM | POA: Diagnosis not present

## 2017-11-03 DIAGNOSIS — F0281 Dementia in other diseases classified elsewhere with behavioral disturbance: Secondary | ICD-10-CM | POA: Diagnosis not present

## 2017-11-03 DIAGNOSIS — F419 Anxiety disorder, unspecified: Secondary | ICD-10-CM | POA: Diagnosis not present

## 2017-11-03 DIAGNOSIS — F333 Major depressive disorder, recurrent, severe with psychotic symptoms: Secondary | ICD-10-CM | POA: Diagnosis not present

## 2017-11-05 LAB — TYPE AND SCREEN
ABO/RH(D): A NEG
ANTIBODY SCREEN: POSITIVE
UNIT DIVISION: 0
Unit division: 0

## 2017-11-05 LAB — BPAM RBC
BLOOD PRODUCT EXPIRATION DATE: 201905302359
BLOOD PRODUCT EXPIRATION DATE: 201905312359
Unit Type and Rh: 600
Unit Type and Rh: 600

## 2017-11-06 DIAGNOSIS — F0281 Dementia in other diseases classified elsewhere with behavioral disturbance: Secondary | ICD-10-CM | POA: Diagnosis not present

## 2017-11-06 DIAGNOSIS — F419 Anxiety disorder, unspecified: Secondary | ICD-10-CM | POA: Diagnosis not present

## 2017-11-06 DIAGNOSIS — F333 Major depressive disorder, recurrent, severe with psychotic symptoms: Secondary | ICD-10-CM | POA: Diagnosis not present

## 2017-11-08 DIAGNOSIS — K219 Gastro-esophageal reflux disease without esophagitis: Secondary | ICD-10-CM | POA: Diagnosis not present

## 2017-11-08 DIAGNOSIS — D631 Anemia in chronic kidney disease: Secondary | ICD-10-CM | POA: Diagnosis not present

## 2017-11-08 DIAGNOSIS — R197 Diarrhea, unspecified: Secondary | ICD-10-CM | POA: Diagnosis not present

## 2017-11-08 DIAGNOSIS — E785 Hyperlipidemia, unspecified: Secondary | ICD-10-CM | POA: Diagnosis not present

## 2017-11-08 DIAGNOSIS — G3184 Mild cognitive impairment, so stated: Secondary | ICD-10-CM | POA: Diagnosis not present

## 2017-11-08 DIAGNOSIS — E559 Vitamin D deficiency, unspecified: Secondary | ICD-10-CM | POA: Diagnosis not present

## 2017-11-08 DIAGNOSIS — I2510 Atherosclerotic heart disease of native coronary artery without angina pectoris: Secondary | ICD-10-CM | POA: Diagnosis not present

## 2017-11-08 DIAGNOSIS — E039 Hypothyroidism, unspecified: Secondary | ICD-10-CM | POA: Diagnosis not present

## 2017-11-08 DIAGNOSIS — G8929 Other chronic pain: Secondary | ICD-10-CM | POA: Diagnosis not present

## 2017-11-08 DIAGNOSIS — R296 Repeated falls: Secondary | ICD-10-CM | POA: Diagnosis not present

## 2017-11-08 DIAGNOSIS — R05 Cough: Secondary | ICD-10-CM | POA: Diagnosis not present

## 2017-11-08 DIAGNOSIS — R21 Rash and other nonspecific skin eruption: Secondary | ICD-10-CM | POA: Diagnosis not present

## 2017-11-09 DIAGNOSIS — F0281 Dementia in other diseases classified elsewhere with behavioral disturbance: Secondary | ICD-10-CM | POA: Diagnosis not present

## 2017-11-13 DIAGNOSIS — G3184 Mild cognitive impairment, so stated: Secondary | ICD-10-CM | POA: Diagnosis not present

## 2017-11-13 DIAGNOSIS — R531 Weakness: Secondary | ICD-10-CM | POA: Diagnosis not present

## 2017-11-13 DIAGNOSIS — M109 Gout, unspecified: Secondary | ICD-10-CM | POA: Diagnosis not present

## 2017-11-13 DIAGNOSIS — I129 Hypertensive chronic kidney disease with stage 1 through stage 4 chronic kidney disease, or unspecified chronic kidney disease: Secondary | ICD-10-CM | POA: Diagnosis not present

## 2017-11-13 DIAGNOSIS — F329 Major depressive disorder, single episode, unspecified: Secondary | ICD-10-CM | POA: Diagnosis not present

## 2017-11-13 DIAGNOSIS — R296 Repeated falls: Secondary | ICD-10-CM | POA: Diagnosis not present

## 2017-11-16 DIAGNOSIS — M109 Gout, unspecified: Secondary | ICD-10-CM | POA: Diagnosis not present

## 2017-11-16 DIAGNOSIS — R531 Weakness: Secondary | ICD-10-CM | POA: Diagnosis not present

## 2017-11-16 DIAGNOSIS — F329 Major depressive disorder, single episode, unspecified: Secondary | ICD-10-CM | POA: Diagnosis not present

## 2017-11-16 DIAGNOSIS — I129 Hypertensive chronic kidney disease with stage 1 through stage 4 chronic kidney disease, or unspecified chronic kidney disease: Secondary | ICD-10-CM | POA: Diagnosis not present

## 2017-11-16 DIAGNOSIS — G3184 Mild cognitive impairment, so stated: Secondary | ICD-10-CM | POA: Diagnosis not present

## 2017-11-16 DIAGNOSIS — R296 Repeated falls: Secondary | ICD-10-CM | POA: Diagnosis not present

## 2017-11-20 DIAGNOSIS — R531 Weakness: Secondary | ICD-10-CM | POA: Diagnosis not present

## 2017-11-20 DIAGNOSIS — G3184 Mild cognitive impairment, so stated: Secondary | ICD-10-CM | POA: Diagnosis not present

## 2017-11-20 DIAGNOSIS — M109 Gout, unspecified: Secondary | ICD-10-CM | POA: Diagnosis not present

## 2017-11-20 DIAGNOSIS — F329 Major depressive disorder, single episode, unspecified: Secondary | ICD-10-CM | POA: Diagnosis not present

## 2017-11-20 DIAGNOSIS — I129 Hypertensive chronic kidney disease with stage 1 through stage 4 chronic kidney disease, or unspecified chronic kidney disease: Secondary | ICD-10-CM | POA: Diagnosis not present

## 2017-11-20 DIAGNOSIS — R296 Repeated falls: Secondary | ICD-10-CM | POA: Diagnosis not present

## 2017-11-22 DIAGNOSIS — F333 Major depressive disorder, recurrent, severe with psychotic symptoms: Secondary | ICD-10-CM | POA: Diagnosis not present

## 2017-11-22 DIAGNOSIS — F0281 Dementia in other diseases classified elsewhere with behavioral disturbance: Secondary | ICD-10-CM | POA: Diagnosis not present

## 2017-11-22 DIAGNOSIS — F419 Anxiety disorder, unspecified: Secondary | ICD-10-CM | POA: Diagnosis not present

## 2017-11-23 DIAGNOSIS — F329 Major depressive disorder, single episode, unspecified: Secondary | ICD-10-CM | POA: Diagnosis not present

## 2017-11-23 DIAGNOSIS — R296 Repeated falls: Secondary | ICD-10-CM | POA: Diagnosis not present

## 2017-11-23 DIAGNOSIS — G3184 Mild cognitive impairment, so stated: Secondary | ICD-10-CM | POA: Diagnosis not present

## 2017-11-23 DIAGNOSIS — R531 Weakness: Secondary | ICD-10-CM | POA: Diagnosis not present

## 2017-11-23 DIAGNOSIS — I129 Hypertensive chronic kidney disease with stage 1 through stage 4 chronic kidney disease, or unspecified chronic kidney disease: Secondary | ICD-10-CM | POA: Diagnosis not present

## 2017-11-23 DIAGNOSIS — M109 Gout, unspecified: Secondary | ICD-10-CM | POA: Diagnosis not present

## 2017-11-28 DIAGNOSIS — I129 Hypertensive chronic kidney disease with stage 1 through stage 4 chronic kidney disease, or unspecified chronic kidney disease: Secondary | ICD-10-CM | POA: Diagnosis not present

## 2017-11-28 DIAGNOSIS — F329 Major depressive disorder, single episode, unspecified: Secondary | ICD-10-CM | POA: Diagnosis not present

## 2017-11-28 DIAGNOSIS — G3184 Mild cognitive impairment, so stated: Secondary | ICD-10-CM | POA: Diagnosis not present

## 2017-11-28 DIAGNOSIS — R296 Repeated falls: Secondary | ICD-10-CM | POA: Diagnosis not present

## 2017-11-28 DIAGNOSIS — M109 Gout, unspecified: Secondary | ICD-10-CM | POA: Diagnosis not present

## 2017-11-28 DIAGNOSIS — R531 Weakness: Secondary | ICD-10-CM | POA: Diagnosis not present

## 2017-11-29 ENCOUNTER — Ambulatory Visit: Payer: Medicare Other | Admitting: Neurology

## 2017-11-29 DIAGNOSIS — F333 Major depressive disorder, recurrent, severe with psychotic symptoms: Secondary | ICD-10-CM | POA: Diagnosis not present

## 2017-11-29 DIAGNOSIS — F419 Anxiety disorder, unspecified: Secondary | ICD-10-CM | POA: Diagnosis not present

## 2017-11-29 DIAGNOSIS — F0281 Dementia in other diseases classified elsewhere with behavioral disturbance: Secondary | ICD-10-CM | POA: Diagnosis not present

## 2017-11-30 DIAGNOSIS — K219 Gastro-esophageal reflux disease without esophagitis: Secondary | ICD-10-CM | POA: Diagnosis not present

## 2017-11-30 DIAGNOSIS — G8929 Other chronic pain: Secondary | ICD-10-CM | POA: Diagnosis not present

## 2017-11-30 DIAGNOSIS — F333 Major depressive disorder, recurrent, severe with psychotic symptoms: Secondary | ICD-10-CM | POA: Diagnosis not present

## 2017-11-30 DIAGNOSIS — E559 Vitamin D deficiency, unspecified: Secondary | ICD-10-CM | POA: Diagnosis not present

## 2017-11-30 DIAGNOSIS — R197 Diarrhea, unspecified: Secondary | ICD-10-CM | POA: Diagnosis not present

## 2017-11-30 DIAGNOSIS — R05 Cough: Secondary | ICD-10-CM | POA: Diagnosis not present

## 2017-11-30 DIAGNOSIS — R296 Repeated falls: Secondary | ICD-10-CM | POA: Diagnosis not present

## 2017-11-30 DIAGNOSIS — D631 Anemia in chronic kidney disease: Secondary | ICD-10-CM | POA: Diagnosis not present

## 2017-11-30 DIAGNOSIS — F419 Anxiety disorder, unspecified: Secondary | ICD-10-CM | POA: Diagnosis not present

## 2017-11-30 DIAGNOSIS — E039 Hypothyroidism, unspecified: Secondary | ICD-10-CM | POA: Diagnosis not present

## 2017-11-30 DIAGNOSIS — R21 Rash and other nonspecific skin eruption: Secondary | ICD-10-CM | POA: Diagnosis not present

## 2017-11-30 DIAGNOSIS — I2510 Atherosclerotic heart disease of native coronary artery without angina pectoris: Secondary | ICD-10-CM | POA: Diagnosis not present

## 2017-11-30 DIAGNOSIS — E785 Hyperlipidemia, unspecified: Secondary | ICD-10-CM | POA: Diagnosis not present

## 2017-11-30 DIAGNOSIS — G3184 Mild cognitive impairment, so stated: Secondary | ICD-10-CM | POA: Diagnosis not present

## 2017-11-30 DIAGNOSIS — F0281 Dementia in other diseases classified elsewhere with behavioral disturbance: Secondary | ICD-10-CM | POA: Diagnosis not present

## 2017-11-30 DIAGNOSIS — F39 Unspecified mood [affective] disorder: Secondary | ICD-10-CM | POA: Diagnosis not present

## 2017-12-01 DIAGNOSIS — R296 Repeated falls: Secondary | ICD-10-CM | POA: Diagnosis not present

## 2017-12-01 DIAGNOSIS — I129 Hypertensive chronic kidney disease with stage 1 through stage 4 chronic kidney disease, or unspecified chronic kidney disease: Secondary | ICD-10-CM | POA: Diagnosis not present

## 2017-12-01 DIAGNOSIS — G3184 Mild cognitive impairment, so stated: Secondary | ICD-10-CM | POA: Diagnosis not present

## 2017-12-01 DIAGNOSIS — F329 Major depressive disorder, single episode, unspecified: Secondary | ICD-10-CM | POA: Diagnosis not present

## 2017-12-01 DIAGNOSIS — R531 Weakness: Secondary | ICD-10-CM | POA: Diagnosis not present

## 2017-12-01 DIAGNOSIS — M109 Gout, unspecified: Secondary | ICD-10-CM | POA: Diagnosis not present

## 2017-12-04 DIAGNOSIS — F0281 Dementia in other diseases classified elsewhere with behavioral disturbance: Secondary | ICD-10-CM | POA: Diagnosis not present

## 2017-12-04 DIAGNOSIS — G301 Alzheimer's disease with late onset: Secondary | ICD-10-CM | POA: Diagnosis not present

## 2017-12-04 DIAGNOSIS — F419 Anxiety disorder, unspecified: Secondary | ICD-10-CM | POA: Diagnosis not present

## 2017-12-04 DIAGNOSIS — F333 Major depressive disorder, recurrent, severe with psychotic symptoms: Secondary | ICD-10-CM | POA: Diagnosis not present

## 2017-12-06 DIAGNOSIS — F333 Major depressive disorder, recurrent, severe with psychotic symptoms: Secondary | ICD-10-CM | POA: Diagnosis not present

## 2017-12-06 DIAGNOSIS — F419 Anxiety disorder, unspecified: Secondary | ICD-10-CM | POA: Diagnosis not present

## 2017-12-06 DIAGNOSIS — F0281 Dementia in other diseases classified elsewhere with behavioral disturbance: Secondary | ICD-10-CM | POA: Diagnosis not present

## 2017-12-07 DIAGNOSIS — N390 Urinary tract infection, site not specified: Secondary | ICD-10-CM | POA: Diagnosis not present

## 2017-12-10 ENCOUNTER — Encounter (HOSPITAL_COMMUNITY): Payer: Self-pay

## 2017-12-10 ENCOUNTER — Emergency Department (HOSPITAL_COMMUNITY)
Admission: EM | Admit: 2017-12-10 | Discharge: 2017-12-10 | Disposition: A | Discharge status: Home / Self Care | Payer: Medicare Other | Attending: Emergency Medicine | Admitting: Emergency Medicine

## 2017-12-10 ENCOUNTER — Other Ambulatory Visit: Payer: Self-pay

## 2017-12-10 DIAGNOSIS — F0390 Unspecified dementia without behavioral disturbance: Secondary | ICD-10-CM | POA: Insufficient documentation

## 2017-12-10 DIAGNOSIS — M25551 Pain in right hip: Secondary | ICD-10-CM | POA: Diagnosis not present

## 2017-12-10 DIAGNOSIS — Z87891 Personal history of nicotine dependence: Secondary | ICD-10-CM | POA: Diagnosis not present

## 2017-12-10 DIAGNOSIS — W06XXXA Fall from bed, initial encounter: Secondary | ICD-10-CM | POA: Insufficient documentation

## 2017-12-10 DIAGNOSIS — E039 Hypothyroidism, unspecified: Secondary | ICD-10-CM | POA: Diagnosis not present

## 2017-12-10 DIAGNOSIS — W19XXXA Unspecified fall, initial encounter: Secondary | ICD-10-CM | POA: Diagnosis not present

## 2017-12-10 DIAGNOSIS — M25519 Pain in unspecified shoulder: Secondary | ICD-10-CM | POA: Diagnosis not present

## 2017-12-10 DIAGNOSIS — I10 Essential (primary) hypertension: Secondary | ICD-10-CM | POA: Diagnosis not present

## 2017-12-10 DIAGNOSIS — Z7982 Long term (current) use of aspirin: Secondary | ICD-10-CM | POA: Diagnosis not present

## 2017-12-10 DIAGNOSIS — Z79899 Other long term (current) drug therapy: Secondary | ICD-10-CM | POA: Diagnosis not present

## 2017-12-10 LAB — URINALYSIS, ROUTINE W REFLEX MICROSCOPIC
BILIRUBIN URINE: NEGATIVE
Bacteria, UA: NONE SEEN
Glucose, UA: NEGATIVE mg/dL
KETONES UR: NEGATIVE mg/dL
Leukocytes, UA: NEGATIVE
Nitrite: NEGATIVE
PH: 8 (ref 5.0–8.0)
Protein, ur: NEGATIVE mg/dL
Specific Gravity, Urine: 1.009 (ref 1.005–1.030)

## 2017-12-10 LAB — BASIC METABOLIC PANEL
ANION GAP: 8 (ref 5–15)
BUN: 25 mg/dL — ABNORMAL HIGH (ref 6–20)
CHLORIDE: 102 mmol/L (ref 101–111)
CO2: 31 mmol/L (ref 22–32)
Calcium: 9.5 mg/dL (ref 8.9–10.3)
Creatinine, Ser: 1.22 mg/dL — ABNORMAL HIGH (ref 0.44–1.00)
GFR calc Af Amer: 44 mL/min — ABNORMAL LOW (ref >60)
GFR, EST NON AFRICAN AMERICAN: 38 mL/min — AB (ref >60)
GLUCOSE: 98 mg/dL (ref 65–99)
POTASSIUM: 3.9 mmol/L (ref 3.5–5.1)
Sodium: 141 mmol/L (ref 135–145)

## 2017-12-10 LAB — CBC
HCT: 33 % — ABNORMAL LOW (ref 36.0–46.0)
HEMOGLOBIN: 10.9 g/dL — AB (ref 12.0–15.0)
MCH: 31.1 pg (ref 26.0–34.0)
MCHC: 33 g/dL (ref 30.0–36.0)
MCV: 94.3 fL (ref 78.0–100.0)
PLATELETS: 209 10*3/uL (ref 150–400)
RBC: 3.5 MIL/uL — AB (ref 3.87–5.11)
RDW: 12.8 % (ref 11.5–15.5)
WBC: 6.1 10*3/uL (ref 4.0–10.5)

## 2017-12-10 NOTE — ED Notes (Signed)
Bed: [id number] Expected date:  Expected time:  Means of arrival:  Comments: Ems 84 yo fall

## 2017-12-10 NOTE — ED Triage Notes (Signed)
She reports "fell out of bed" this morning. She is oriented x 3 (all except date) with clear speech. Paramedic state pt. Initially c/o right hip area pain, however, upon arrival here she only reports "sore" shoulders. She is in no distress. She arrives with a rigid c-collar, which I maintain. She moves all extremities with ease on command.

## 2017-12-10 NOTE — ED Provider Notes (Signed)
[facility] Provider Note   CSN: [id number] Arrival date & time: 12/10/17  1015     History   Chief Complaint Chief Complaint  Patient presents with  . Fall    HPI Diamond [name] is a 84 y.o. female.  HPI 84 year old female is brought to the emergency department after mechanical fall today.  Patient states that she lost her balance getting out of her bed.  Normally she walks with her walker and she did not have her walker with her.  Patient initially complained of some mild pain to her right hip however on arrival to the emergency department she reports no significant pain in her hip.  She denies fevers and chills.  She denies neck pain.  No headache.  No use of anticoagulants.  Family reports baseline mental status.  Patient reports mild abrasion to her right lateral cheek but reports no trismus or malocclusion.  She reports no loss of consciousness.  No nausea or vomiting.  She has full range of motion of all of her major joints in both her upper and lower extremities. Past Medical History:  Diagnosis Date  . Arthritis    "everywhere"  . Dementia   . High cholesterol   . History of blood transfusion    "related to my hip OR"  . History of gout   . Hypertension   . Hypothyroidism   . Migraine    "q 5-6 years" (06/06/2014)    Patient Active Problem List   Diagnosis Date Noted  . High cholesterol   . UTI (lower urinary tract infection)   . Anemia 06/06/2014  . E. coli UTI 06/06/2014  . Generalized weakness 06/06/2014  . Left humeral fracture 06/06/2014  . Depression 06/06/2014  . Hypothyroidism 06/06/2014  . Overactive bladder 06/06/2014  . Normocytic anemia 06/06/2014  . Frequent falls   . Bleeding gastrointestinal     Past Surgical History:  Procedure Laterality Date  . ABDOMINAL HYSTERECTOMY    . ANKLE FRACTURE SURGERY Right 1988   "crushed it"  . CHOLECYSTECTOMY  1970's  . DILATION AND CURETTAGE OF UTERUS    . FRACTURE  SURGERY    . JOINT REPLACEMENT    . TOTAL HIP ARTHROPLASTY Right 1980's  . TUBAL LIGATION       OB History   None      Home Medications    Prior to Admission medications   Medication Sig Start Date End Date Taking? Authorizing Provider  aspirin EC 81 MG tablet Take 1 tablet (81 mg total) by mouth daily. 03/29/17  Yes Pricilla Loveless, MD  acetaminophen (TYLENOL) 650 MG CR tablet Take 650 mg by mouth every 8 (eight) hours as needed for pain.    [provider]  calcium-vitamin D (OSCAL WITH D) 500-200 MG-UNIT tablet Take 1 tablet by mouth 2 (two) times daily.    [provider]  Cholecalciferol (VITAMIN D) 2000 units CAPS Take 2,000 Units by mouth daily.    [provider]  citalopram (CELEXA) 20 MG tablet Take 20 mg by mouth daily.    [provider]  divalproex (DEPAKOTE ER) 250 MG 24 hr tablet Take 1 tablet every night Patient not taking: Reported on 11/01/2017 03/27/17   Van Clines, MD  divalproex (DEPAKOTE SPRINKLE) 125 MG capsule Take 125 mg by mouth 2 (two) times daily.    [provider]  donepezil (ARICEPT) 10 MG tablet Take 10 mg by mouth at bedtime.    [provider]  guaiFENesin (ROBITUSSIN) 100 MG/5ML liquid Take 200 mg by mouth every 6 (six) hours as needed for cough.    [provider]  hydrocortisone 2.5 % cream Apply 1 application topically 2 (two) times daily.    [provider]  iron polysaccharides (NIFEREX) 150 MG capsule Take 150 mg by mouth daily.    [provider]  levothyroxine (SYNTHROID, LEVOTHROID) 50 MCG tablet Take 50 mcg by mouth daily before breakfast.    [provider]  loperamide (IMODIUM A-D) 2 MG tablet Take 2 mg by mouth 4 (four) times daily as needed for diarrhea or loose stools.    [provider]  meloxicam (MOBIC) 7.5 MG tablet Take 7.5 mg by mouth every 12 (twelve) hours as needed for pain.    [provider]  Multiple Vitamin  (MULTIVITAMIN WITH MINERALS) TABS tablet Take 1 tablet by mouth daily.    [provider]  nitrofurantoin, macrocrystal-monohydrate, (MACROBID) 100 MG capsule Take 1 capsule (100 mg total) by mouth 2 (two) times daily. Patient not taking: Reported on 11/01/2017 04/19/17   Rolland Porter, MD  pantoprazole (PROTONIX) 40 MG tablet Take 1 tablet (40 mg total) by mouth daily. 06/09/14   Rodolph Bong, MD  risperiDONE (RISPERDAL) 0.25 MG tablet Take 0.25 mg by mouth every 12 (twelve) hours as needed (aggitation).    [provider]  simvastatin (ZOCOR) 40 MG tablet Take 40 mg by mouth at bedtime.     [provider]    Family History Family History  Problem Relation Age of Onset  . Heart attack Father     Social History Social History   Tobacco Use  . Smoking status: Former Smoker    Packs/day: 2.00    Years: 40.00    Pack years: 80.00    Types: Cigarettes    Last attempt to quit: 05/05/1987    Years since quitting: 30.6  . Smokeless tobacco: Never Used  Substance Use Topics  . Alcohol use: Yes    Alcohol/week: 4.2 oz    Types: 7 Glasses of wine per week  . Drug use: No     Allergies   Codeine; Aripiprazole; Penicillins; Prednisone; and Tramadol   Review of Systems Review of Systems  All other systems reviewed and are negative.    Physical Exam Updated Vital Signs BP (!) 174/88 (BP Location: Left Arm)   Pulse (!) 56   Temp 97.6 F (36.4 C) (Oral)   Resp 16   SpO2 100%   Physical Exam  Constitutional: She is oriented to person, place, and time. She appears well-developed and well-nourished. No distress.  HENT:  Head: Normocephalic and atraumatic.  No trismus or malocclusion.  Eyes: EOM are normal.  Neck: Normal range of motion.  No C-spine tenderness or C-spine step-off.  Full range of motion of neck in all directions  Cardiovascular: Normal rate, regular rhythm and normal heart sounds.  Pulmonary/Chest: Effort normal and breath sounds  normal.  Abdominal: Soft. She exhibits no distension. There is no tenderness.  Musculoskeletal: Normal range of motion.  Full range of motion of bilateral upper and lower extremity major joints.  Normal grip strength bilaterally.  Neurological: She is alert and oriented to person, place, and time.  Skin: Skin is warm and dry.  Psychiatric: She has a normal mood and affect. Judgment normal.  Nursing note and vitals reviewed.    ED Treatments / Results  Labs (all labs ordered are listed, but only abnormal results  are displayed) Labs Reviewed  CBC - Abnormal; Notable for the following components:      Result Value   RBC 3.50 (*)    Hemoglobin 10.9 (*)    HCT 33.0 (*)    All other components within normal limits  BASIC METABOLIC PANEL - Abnormal; Notable for the following components:   BUN 25 (*)    Creatinine, Ser 1.22 (*)    GFR calc non Af Amer 38 (*)    GFR calc Af Amer 44 (*)    All other components within normal limits  URINALYSIS, ROUTINE W REFLEX MICROSCOPIC - Abnormal; Notable for the following components:   Color, Urine STRAW (*)    Hgb urine dipstick SMALL (*)    All other components within normal limits    EKG None  Radiology No results found.  Procedures Procedures (including critical care time)  Medications Ordered in ED Medications - No data to display   Initial Impression / Assessment and Plan / ED Course  I have reviewed the triage vital signs and the nursing notes.  Pertinent labs & imaging results that were available during my care of the patient were reviewed by me and considered in my medical decision making (see chart for details).     Well-appearing.  C-spine cleared by Nexus criteria.  No indication for imaging of her head.  No use of anticoagulants.  No loss consciousness.  No vomiting.  No headache.  Baseline mental status.  Full range of motion of major joints.  Baseline labs and urine without significant abnormality.  Patient and family are  comfortable with discharge home at this time.  Close primary care follow-up.  No indication for additional testing or management at this time.  Patient and family are encouraged to return to the emergency department for new or worsening symptoms  Final Clinical Impressions(s) / ED Diagnoses   Final diagnoses:  Fall, initial encounter    ED Discharge Orders    None       Azalia Bilisampos, Mancel Lardizabal, MD 12/10/17 2334

## 2017-12-10 NOTE — ED Notes (Signed)
Pt urinated on bed pan but specimen was contaminated. Will recollect.

## 2017-12-13 ENCOUNTER — Emergency Department (HOSPITAL_BASED_OUTPATIENT_CLINIC_OR_DEPARTMENT_OTHER): Payer: Medicare Other

## 2017-12-13 ENCOUNTER — Other Ambulatory Visit: Payer: Self-pay

## 2017-12-13 ENCOUNTER — Encounter (HOSPITAL_BASED_OUTPATIENT_CLINIC_OR_DEPARTMENT_OTHER): Payer: Self-pay

## 2017-12-13 ENCOUNTER — Emergency Department (HOSPITAL_BASED_OUTPATIENT_CLINIC_OR_DEPARTMENT_OTHER)
Admission: EM | Admit: 2017-12-13 | Discharge: 2017-12-13 | Disposition: A | Discharge status: Skilled Nursing Facility | Payer: Medicare Other | Attending: Emergency Medicine | Admitting: Emergency Medicine

## 2017-12-13 DIAGNOSIS — S161XXA Strain of muscle, fascia and tendon at neck level, initial encounter: Secondary | ICD-10-CM | POA: Diagnosis not present

## 2017-12-13 DIAGNOSIS — F0390 Unspecified dementia without behavioral disturbance: Secondary | ICD-10-CM | POA: Insufficient documentation

## 2017-12-13 DIAGNOSIS — S0990XA Unspecified injury of head, initial encounter: Secondary | ICD-10-CM | POA: Diagnosis not present

## 2017-12-13 DIAGNOSIS — F333 Major depressive disorder, recurrent, severe with psychotic symptoms: Secondary | ICD-10-CM | POA: Diagnosis not present

## 2017-12-13 DIAGNOSIS — Y92122 Bedroom in nursing home as the place of occurrence of the external cause: Secondary | ICD-10-CM | POA: Diagnosis not present

## 2017-12-13 DIAGNOSIS — F419 Anxiety disorder, unspecified: Secondary | ICD-10-CM | POA: Diagnosis not present

## 2017-12-13 DIAGNOSIS — S199XXA Unspecified injury of neck, initial encounter: Secondary | ICD-10-CM | POA: Diagnosis not present

## 2017-12-13 DIAGNOSIS — Z87891 Personal history of nicotine dependence: Secondary | ICD-10-CM | POA: Insufficient documentation

## 2017-12-13 DIAGNOSIS — W010XXA Fall on same level from slipping, tripping and stumbling without subsequent striking against object, initial encounter: Secondary | ICD-10-CM | POA: Diagnosis not present

## 2017-12-13 DIAGNOSIS — E039 Hypothyroidism, unspecified: Secondary | ICD-10-CM | POA: Insufficient documentation

## 2017-12-13 DIAGNOSIS — W19XXXA Unspecified fall, initial encounter: Secondary | ICD-10-CM

## 2017-12-13 DIAGNOSIS — Y999 Unspecified external cause status: Secondary | ICD-10-CM | POA: Insufficient documentation

## 2017-12-13 DIAGNOSIS — S39012A Strain of muscle, fascia and tendon of lower back, initial encounter: Secondary | ICD-10-CM | POA: Diagnosis not present

## 2017-12-13 DIAGNOSIS — R51 Headache: Secondary | ICD-10-CM | POA: Diagnosis not present

## 2017-12-13 DIAGNOSIS — I10 Essential (primary) hypertension: Secondary | ICD-10-CM | POA: Diagnosis not present

## 2017-12-13 DIAGNOSIS — F0281 Dementia in other diseases classified elsewhere with behavioral disturbance: Secondary | ICD-10-CM | POA: Diagnosis not present

## 2017-12-13 DIAGNOSIS — S3992XA Unspecified injury of lower back, initial encounter: Secondary | ICD-10-CM | POA: Diagnosis not present

## 2017-12-13 DIAGNOSIS — Y9389 Activity, other specified: Secondary | ICD-10-CM | POA: Insufficient documentation

## 2017-12-13 DIAGNOSIS — T1490XA Injury, unspecified, initial encounter: Secondary | ICD-10-CM | POA: Diagnosis not present

## 2017-12-13 NOTE — ED Provider Notes (Signed)
Emergency Department Provider Note   I have reviewed the triage vital signs and the nursing notes.   HISTORY  Chief Complaint Fall   HPI Diamond [name] is a 84 y.o. female with PMH of HLD, HTN, and dementia currently at SNF presents to the emergency department for evaluation after fall today.  The patient was confused and got out of bed.  The patient's daughter states that staff is trying to get her back in bed when she tripped over her walker and fell.  Staff reports head injury but denies loss of consciousness.  Patient's been complaining of pain in her lower back.  Daughter states that she is confused at her current baseline.  She seems to be deteriorating in terms of her dementia Diamond [name]-term but does not seem worse today.   Level 5 caveat: Dementia.   Past Medical History:  Diagnosis Date  . Arthritis    "everywhere"  . Dementia   . High cholesterol   . History of blood transfusion    "related to my hip OR"  . History of gout   . Hypertension   . Hypothyroidism   . Migraine    "q 5-6 years" (06/06/2014)    Patient Active Problem List   Diagnosis Date Noted  . High cholesterol   . UTI (lower urinary tract infection)   . Anemia 06/06/2014  . E. coli UTI 06/06/2014  . Generalized weakness 06/06/2014  . Left humeral fracture 06/06/2014  . Depression 06/06/2014  . Hypothyroidism 06/06/2014  . Overactive bladder 06/06/2014  . Normocytic anemia 06/06/2014  . Frequent falls   . Bleeding gastrointestinal     Past Surgical History:  Procedure Laterality Date  . ABDOMINAL HYSTERECTOMY    . ANKLE FRACTURE SURGERY Right 1988   "crushed it"  . CHOLECYSTECTOMY  1970's  . DILATION AND CURETTAGE OF UTERUS    . FRACTURE SURGERY    . JOINT REPLACEMENT    . TOTAL HIP ARTHROPLASTY Right 1980's  . TUBAL LIGATION     Allergies Codeine; Aripiprazole; Penicillins; Prednisone; and Tramadol  Family History  Problem Relation Age of Onset  . Heart attack Father      Social History Social History   Tobacco Use  . Smoking status: Former Smoker    Packs/day: 2.00    Years: 40.00    Pack years: 80.00    Types: Cigarettes    Last attempt to quit: 05/05/1987    Years since quitting: 30.6  . Smokeless tobacco: Never Used  Substance Use Topics  . Alcohol use: Yes    Alcohol/week: 4.2 oz    Types: 7 Glasses of wine per week  . Drug use: No    Review of Systems  Constitutional: No fever/chills Eyes: No visual changes. ENT: No sore throat. Cardiovascular: Denies chest pain. Respiratory: Denies shortness of breath. Gastrointestinal: No abdominal pain.  No nausea, no vomiting.  No diarrhea.  No constipation. Genitourinary: Negative for dysuria. Musculoskeletal: Positive for back and neck pain.  Skin: Negative for rash. Neurological: Negative for focal weakness or numbness. Positive HA.   10-point ROS otherwise negative.  ____________________________________________   PHYSICAL EXAM:  VITAL SIGNS: ED Triage Vitals [12/13/17 2211]  Enc Vitals Group     BP (!) 165/76     Pulse Rate (!) 57     Resp 18     Temp 98 F (36.7 C)     Temp Source Oral     SpO2 99 %   Constitutional: Alert and  oriented. Well appearing and in no acute distress. Eyes: Conjunctivae are normal. PERRL.  Head: Atraumatic. Nose: No congestion/rhinnorhea. Mouth/Throat: Mucous membranes are moist.   Neck: No stridor. No cervical spine tenderness to palpation but some paraspinal tenderness with dementia limiting exam.  Cardiovascular: Normal rate, regular rhythm. Good peripheral circulation. Grossly normal heart sounds.   Respiratory: Normal respiratory effort.  No retractions. Lungs CTAB. Gastrointestinal: Soft and nontender. No distention.  Musculoskeletal: No lower extremity tenderness nor edema. No gross deformities of extremities. Neurologic:  Normal speech and language. No gross focal neurologic deficits are appreciated.  Skin:  Skin is warm, dry and intact.  No rash noted.  ____________________________________________  RADIOLOGY  Dg Lumbar Spine 2-3 Views  Result Date: 12/13/2017 CLINICAL DATA:  Fall transferring from wheelchair to bed. Generalized lumbosacral back pain. EXAM: LUMBAR SPINE - 2-3 VIEW COMPARISON:  None. FINDINGS: The alignment is maintained. Vertebral body heights are normal. There is no listhesis. The posterior elements are intact. Multilevel endplate spurring with mild disc space narrowing at L3-L4. Minimal facet arthropathy L4-L5 and L5-S1. No fracture. Non fusion posterior elements of L5, a normal variant. Sacroiliac joints are congruent. IMPRESSION: Mild for age degenerative change in the lumbar spine. No acute fracture. Electronically Signed   By: Rubye OaksMelanie  Ehinger M.D.   On: 12/13/2017 23:07   Ct Head Wo Contrast  Result Date: 12/13/2017 CLINICAL DATA:  Patient fell this evening.  Posterior headache. EXAM: CT HEAD WITHOUT CONTRAST; CT CERVICAL SPINE WITHOUT CONTRAST TECHNIQUE: Contiguous axial images were obtained from the base of the skull through the vertex without intravenous contrast. COMPARISON:  CT HEAD FINDINGS BRAIN: Mild superficial and mild-to-moderate central atrophy. Chronic microvascular ischemic disease of periventricular and subcortical white matter, mild-to-moderate. Chronic small bilateral basal ganglial lacunar infarcts. No intraparenchymal hemorrhage, mass effect nor midline shift. No acute large vascular territory infarcts. No abnormal extra-axial fluid collections. Basal cisterns are midline and not effaced. No acute cerebellar abnormality. VASCULAR: No hyperdense vessel sign or unexpected calcifications. SKULL/SOFT TISSUES: No skull fracture. No significant soft tissue swelling. ORBITS/SINUSES: The included ocular globes are intact. Bilateral lens replacements.The mastoid air-cells and included paranasal sinuses are well-aerated. OTHER: None. CT CERVICAL SPINE FINDINGS ALIGNMENT: Maintained cervical lordosis.  Intact atlantodental interval. Osteoarthritis of the atlantodental interval with sclerosis and joint space narrowing. SKULL BASE AND VERTEBRAE: Cervical vertebral bodies and posterior elements are intact. No destructive bony lesions. C1-2 articulation maintained. SOFT TISSUES AND SPINAL CANAL: Normal. DISC LEVELS: Mild-to-moderate multilevel degenerative disc disease along the entirety of the cervical spine most pronounced at C6-7 with dorsal osteophytes. Uncovertebral joint osteoarthritis with uncinate spurring is noted at C5-6 and C6-7 contributing to slight right C5-6 and C6-7 moderate left C5-6 foraminal encroachment. Mild C5-6 and C6-7 central canal stenosis due to disc-osteophyte complexes. UPPER CHEST: Lung apices are clear. OTHER: Bilateral extracranial carotid arteriosclerosis. 1.5 cm hypodense nodule of the right thyroid gland. IMPRESSION: CT head: Atrophy with chronic small vessel ischemia. No acute intracranial abnormality. CT cervical spine: 1. Cervical spondylosis without acute cervical spine fracture. No traumatic listhesis. 2. Redemonstration of cystic nodule in the right thyroid lobe measuring approximate 1.5 cm. 3. Bilateral extracranial carotid arteriosclerosis. Electronically Signed   By: Tollie Ethavid  Kwon M.D.   On: 12/13/2017 23:26   Ct Cervical Spine Wo Contrast  Result Date: 12/13/2017 CLINICAL DATA:  Patient fell this evening.  Posterior headache. EXAM: CT HEAD WITHOUT CONTRAST; CT CERVICAL SPINE WITHOUT CONTRAST TECHNIQUE: Contiguous axial images were obtained from the base of the skull through the  vertex without intravenous contrast. COMPARISON:  CT HEAD FINDINGS BRAIN: Mild superficial and mild-to-moderate central atrophy. Chronic microvascular ischemic disease of periventricular and subcortical white matter, mild-to-moderate. Chronic small bilateral basal ganglial lacunar infarcts. No intraparenchymal hemorrhage, mass effect nor midline shift. No acute large vascular territory infarcts. No  abnormal extra-axial fluid collections. Basal cisterns are midline and not effaced. No acute cerebellar abnormality. VASCULAR: No hyperdense vessel sign or unexpected calcifications. SKULL/SOFT TISSUES: No skull fracture. No significant soft tissue swelling. ORBITS/SINUSES: The included ocular globes are intact. Bilateral lens replacements.The mastoid air-cells and included paranasal sinuses are well-aerated. OTHER: None. CT CERVICAL SPINE FINDINGS ALIGNMENT: Maintained cervical lordosis. Intact atlantodental interval. Osteoarthritis of the atlantodental interval with sclerosis and joint space narrowing. SKULL BASE AND VERTEBRAE: Cervical vertebral bodies and posterior elements are intact. No destructive bony lesions. C1-2 articulation maintained. SOFT TISSUES AND SPINAL CANAL: Normal. DISC LEVELS: Mild-to-moderate multilevel degenerative disc disease along the entirety of the cervical spine most pronounced at C6-7 with dorsal osteophytes. Uncovertebral joint osteoarthritis with uncinate spurring is noted at C5-6 and C6-7 contributing to slight right C5-6 and C6-7 moderate left C5-6 foraminal encroachment. Mild C5-6 and C6-7 central canal stenosis due to disc-osteophyte complexes. UPPER CHEST: Lung apices are clear. OTHER: Bilateral extracranial carotid arteriosclerosis. 1.5 cm hypodense nodule of the right thyroid gland. IMPRESSION: CT head: Atrophy with chronic small vessel ischemia. No acute intracranial abnormality. CT cervical spine: 1. Cervical spondylosis without acute cervical spine fracture. No traumatic listhesis. 2. Redemonstration of cystic nodule in the right thyroid lobe measuring approximate 1.5 cm. 3. Bilateral extracranial carotid arteriosclerosis. Electronically Signed   By: Tollie Eth M.D.   On: 12/13/2017 23:26    ____________________________________________   PROCEDURES  Procedure(s) performed:   Procedures  None ____________________________________________   INITIAL IMPRESSION  / ASSESSMENT AND PLAN / ED COURSE  Pertinent labs & imaging results that were available during my care of the patient were reviewed by me and considered in my medical decision making (see chart for details).  Patient presents to the emergency department for evaluation after mechanical fall at her skilled nursing facility.  She is complaining of some lower back pain.  No obvious head trauma, laceration, hematoma.  Given the patient's dementia plan for CT imaging of the head and cervical spine along with plain film. Daughter agrees with no AMS w/u which would include blood and UA as this was done during and ED visit earlier this week.   CT imaging and plain films reviewed with no acute findings. Discussed with patient and daughter at bedside. Daughter will drive the patient back to her facility. Discussed return precautions in detail.   At this time, I do not feel there is any life-threatening condition present. I have reviewed and discussed all results (EKG, imaging, lab, urine as appropriate), exam findings with patient. I have reviewed nursing notes and appropriate previous records.  I feel the patient is safe to be discharged home without further emergent workup. Discussed usual and customary return precautions. Patient and family (if present) verbalize understanding and are comfortable with this plan.  Patient will follow-up with their primary care provider. If they do not have a primary care provider, information for follow-up has been provided to them. All questions have been answered.  ____________________________________________  FINAL CLINICAL IMPRESSION(S) / ED DIAGNOSES  Final diagnoses:  Fall, initial encounter  Injury of head, initial encounter  Strain of neck muscle, initial encounter  Strain of lumbar region, initial encounter    Note:  This  document was prepared using Conservation officer, historic buildings and may include unintentional dictation errors.  Alona Bene, MD Emergency  Medicine    Ebenezer Mccaskey, Arlyss Repress, MD 12/14/17 [phone]

## 2017-12-13 NOTE — ED Triage Notes (Signed)
Pt presents via EMS after falling when trying to transfer from wheelchair to bed. Reports hitting the dresser with the right side of her back. No LOC. Similar episode 3 days ago.

## 2017-12-13 NOTE — Discharge Instructions (Signed)
You were seen in the Emergency Department (ED) today for a head injury.  Based on your evaluation, you may have sustained a concussion (or bruise) to your brain.  If you had a CT scan done, it did not show any evidence of serious injury or bleeding.    Symptoms to expect from a concussion include nausea, mild to moderate headache, difficulty concentrating or sleeping, and mild lightheadedness.  These symptoms should improve over the next few days to weeks, but it may take many weeks before you feel back to normal.  Return to the emergency department or follow-up with your primary care doctor if your symptoms are not improving over this time.  Signs of a more serious head injury include vomiting, severe headache, excessive sleepiness or confusion, and weakness or numbness in your face, arms or legs.  Return immediately to the Emergency Department if you experience any of these more concerning symptoms.    Rest, avoid strenuous physical or mental activity, and avoid activities that could potentially result in another head injury until all your symptoms from this head injury are completely resolved for at least 2-3 weeks.  You may take ibuprofen or acetaminophen over the counter according to label instructions for mild headache or scalp soreness.  

## 2017-12-15 ENCOUNTER — Other Ambulatory Visit: Payer: Self-pay

## 2017-12-15 NOTE — Patient Outreach (Signed)
[facility] Care Management  12/15/2017  Diamond [name] [birth year] [id number]   Telephone Screen Referral Date :12-14-2017 [facility] Reason:ED Utilization Insurance: Medicare  Outreach attempt # 1 To patient.  [facility] [employer] [facility] the patient .  No answer.  Answering machine came on but unable to leave a message.  Another call placed to the daughter Florinda MarkerMary Johnson (she is on the release of information) she was unable to talk at the time.  She asked if I could give her a call back.   Plan: RN Health Coach will send letter. RN Health Coach will make another attempt to the patient within four business days.  Juanell Fairlyraci Fabrizio Filip RN, BSN, [facility] RN Health Coach Disease Management [facility] Direct Dial:  [phone] Fax: [phone]

## 2017-12-18 ENCOUNTER — Other Ambulatory Visit: Payer: Self-pay

## 2017-12-18 NOTE — Patient Outreach (Addendum)
[facility] Care Management  12/18/2017  Diamond [name] 06-Feb-1927 [id number]   Telephone Screen Referral Date :12-14-2017 [facility] Ref[facility] Reason:ED Utilization Insurance: Medicare  Outreach attempt # 2 To patient.  Telephone call placed to Diamond [name] daughter ( on release of information) for the patient.  HIPAA verified by the daughter.  The daughter verified that [place] [address]  She states that she has a physician with the facility and other resources.  She states that her mother does not need services at this time.  I advised her that I will send her a pamphlet and if in the future in need of our services to please contact us.  Plan: RN Health Coach will close the case. The  consumer is enrolled in an external program.   Diamond Fairlyraci Naria Abbey RN, BSN, [facility] RN Health Coach Disease Management [facility] Direct Dial:  [phone]  Fax: [phone]

## 2017-12-20 DIAGNOSIS — F419 Anxiety disorder, unspecified: Secondary | ICD-10-CM | POA: Diagnosis not present

## 2017-12-20 DIAGNOSIS — F333 Major depressive disorder, recurrent, severe with psychotic symptoms: Secondary | ICD-10-CM | POA: Diagnosis not present

## 2017-12-20 DIAGNOSIS — F0281 Dementia in other diseases classified elsewhere with behavioral disturbance: Secondary | ICD-10-CM | POA: Diagnosis not present

## 2017-12-27 DIAGNOSIS — F419 Anxiety disorder, unspecified: Secondary | ICD-10-CM | POA: Diagnosis not present

## 2017-12-27 DIAGNOSIS — F0281 Dementia in other diseases classified elsewhere with behavioral disturbance: Secondary | ICD-10-CM | POA: Diagnosis not present

## 2017-12-27 DIAGNOSIS — F333 Major depressive disorder, recurrent, severe with psychotic symptoms: Secondary | ICD-10-CM | POA: Diagnosis not present

## 2017-12-28 DIAGNOSIS — G4089 Other seizures: Secondary | ICD-10-CM | POA: Diagnosis not present

## 2017-12-29 DIAGNOSIS — F0281 Dementia in other diseases classified elsewhere with behavioral disturbance: Secondary | ICD-10-CM | POA: Diagnosis not present

## 2017-12-29 DIAGNOSIS — E039 Hypothyroidism, unspecified: Secondary | ICD-10-CM | POA: Diagnosis not present

## 2017-12-29 DIAGNOSIS — E785 Hyperlipidemia, unspecified: Secondary | ICD-10-CM | POA: Diagnosis not present

## 2017-12-29 DIAGNOSIS — G8929 Other chronic pain: Secondary | ICD-10-CM | POA: Diagnosis not present

## 2017-12-29 DIAGNOSIS — G3184 Mild cognitive impairment, so stated: Secondary | ICD-10-CM | POA: Diagnosis not present

## 2017-12-29 DIAGNOSIS — F39 Unspecified mood [affective] disorder: Secondary | ICD-10-CM | POA: Diagnosis not present

## 2017-12-29 DIAGNOSIS — E559 Vitamin D deficiency, unspecified: Secondary | ICD-10-CM | POA: Diagnosis not present

## 2017-12-29 DIAGNOSIS — F333 Major depressive disorder, recurrent, severe with psychotic symptoms: Secondary | ICD-10-CM | POA: Diagnosis not present

## 2017-12-29 DIAGNOSIS — K219 Gastro-esophageal reflux disease without esophagitis: Secondary | ICD-10-CM | POA: Diagnosis not present

## 2017-12-29 DIAGNOSIS — I2510 Atherosclerotic heart disease of native coronary artery without angina pectoris: Secondary | ICD-10-CM | POA: Diagnosis not present

## 2017-12-29 DIAGNOSIS — D631 Anemia in chronic kidney disease: Secondary | ICD-10-CM | POA: Diagnosis not present

## 2017-12-29 DIAGNOSIS — F419 Anxiety disorder, unspecified: Secondary | ICD-10-CM | POA: Diagnosis not present

## 2018-01-01 DIAGNOSIS — F333 Major depressive disorder, recurrent, severe with psychotic symptoms: Secondary | ICD-10-CM | POA: Diagnosis not present

## 2018-01-01 DIAGNOSIS — F0281 Dementia in other diseases classified elsewhere with behavioral disturbance: Secondary | ICD-10-CM | POA: Diagnosis not present

## 2018-01-01 DIAGNOSIS — G301 Alzheimer's disease with late onset: Secondary | ICD-10-CM | POA: Diagnosis not present

## 2018-01-01 DIAGNOSIS — F5105 Insomnia due to other mental disorder: Secondary | ICD-10-CM | POA: Diagnosis not present

## 2018-01-01 DIAGNOSIS — F064 Anxiety disorder due to known physiological condition: Secondary | ICD-10-CM | POA: Diagnosis not present

## 2018-01-03 DIAGNOSIS — F419 Anxiety disorder, unspecified: Secondary | ICD-10-CM | POA: Diagnosis not present

## 2018-01-03 DIAGNOSIS — F0281 Dementia in other diseases classified elsewhere with behavioral disturbance: Secondary | ICD-10-CM | POA: Diagnosis not present

## 2018-01-03 DIAGNOSIS — F333 Major depressive disorder, recurrent, severe with psychotic symptoms: Secondary | ICD-10-CM | POA: Diagnosis not present

## 2018-01-10 DIAGNOSIS — R05 Cough: Secondary | ICD-10-CM | POA: Diagnosis not present

## 2018-01-10 DIAGNOSIS — R21 Rash and other nonspecific skin eruption: Secondary | ICD-10-CM | POA: Diagnosis not present

## 2018-01-10 DIAGNOSIS — R296 Repeated falls: Secondary | ICD-10-CM | POA: Diagnosis not present

## 2018-01-10 DIAGNOSIS — G3184 Mild cognitive impairment, so stated: Secondary | ICD-10-CM | POA: Diagnosis not present

## 2018-01-10 DIAGNOSIS — E785 Hyperlipidemia, unspecified: Secondary | ICD-10-CM | POA: Diagnosis not present

## 2018-01-10 DIAGNOSIS — K219 Gastro-esophageal reflux disease without esophagitis: Secondary | ICD-10-CM | POA: Diagnosis not present

## 2018-01-10 DIAGNOSIS — R197 Diarrhea, unspecified: Secondary | ICD-10-CM | POA: Diagnosis not present

## 2018-01-10 DIAGNOSIS — E039 Hypothyroidism, unspecified: Secondary | ICD-10-CM | POA: Diagnosis not present

## 2018-01-10 DIAGNOSIS — G4700 Insomnia, unspecified: Secondary | ICD-10-CM | POA: Diagnosis not present

## 2018-01-10 DIAGNOSIS — D631 Anemia in chronic kidney disease: Secondary | ICD-10-CM | POA: Diagnosis not present

## 2018-01-10 DIAGNOSIS — E559 Vitamin D deficiency, unspecified: Secondary | ICD-10-CM | POA: Diagnosis not present

## 2018-01-10 DIAGNOSIS — F0281 Dementia in other diseases classified elsewhere with behavioral disturbance: Secondary | ICD-10-CM | POA: Diagnosis not present

## 2018-01-10 DIAGNOSIS — F419 Anxiety disorder, unspecified: Secondary | ICD-10-CM | POA: Diagnosis not present

## 2018-01-10 DIAGNOSIS — G8929 Other chronic pain: Secondary | ICD-10-CM | POA: Diagnosis not present

## 2018-01-10 DIAGNOSIS — F333 Major depressive disorder, recurrent, severe with psychotic symptoms: Secondary | ICD-10-CM | POA: Diagnosis not present

## 2018-01-15 DIAGNOSIS — F5105 Insomnia due to other mental disorder: Secondary | ICD-10-CM | POA: Diagnosis not present

## 2018-01-15 DIAGNOSIS — F064 Anxiety disorder due to known physiological condition: Secondary | ICD-10-CM | POA: Diagnosis not present

## 2018-01-15 DIAGNOSIS — F333 Major depressive disorder, recurrent, severe with psychotic symptoms: Secondary | ICD-10-CM | POA: Diagnosis not present

## 2018-01-15 DIAGNOSIS — F0281 Dementia in other diseases classified elsewhere with behavioral disturbance: Secondary | ICD-10-CM | POA: Diagnosis not present

## 2018-01-15 DIAGNOSIS — G301 Alzheimer's disease with late onset: Secondary | ICD-10-CM | POA: Diagnosis not present

## 2018-01-24 DIAGNOSIS — F419 Anxiety disorder, unspecified: Secondary | ICD-10-CM | POA: Diagnosis not present

## 2018-01-24 DIAGNOSIS — F333 Major depressive disorder, recurrent, severe with psychotic symptoms: Secondary | ICD-10-CM | POA: Diagnosis not present

## 2018-01-24 DIAGNOSIS — F0281 Dementia in other diseases classified elsewhere with behavioral disturbance: Secondary | ICD-10-CM | POA: Diagnosis not present

## 2018-01-29 ENCOUNTER — Encounter (HOSPITAL_BASED_OUTPATIENT_CLINIC_OR_DEPARTMENT_OTHER): Payer: Self-pay | Admitting: *Deleted

## 2018-01-29 ENCOUNTER — Emergency Department (HOSPITAL_BASED_OUTPATIENT_CLINIC_OR_DEPARTMENT_OTHER)
Admission: EM | Admit: 2018-01-29 | Discharge: 2018-01-29 | Disposition: A | Discharge status: Home / Self Care | Payer: Medicare Other | Attending: Emergency Medicine | Admitting: Emergency Medicine

## 2018-01-29 ENCOUNTER — Emergency Department (HOSPITAL_BASED_OUTPATIENT_CLINIC_OR_DEPARTMENT_OTHER): Payer: Medicare Other

## 2018-01-29 ENCOUNTER — Other Ambulatory Visit: Payer: Self-pay

## 2018-01-29 DIAGNOSIS — F333 Major depressive disorder, recurrent, severe with psychotic symptoms: Secondary | ICD-10-CM | POA: Diagnosis not present

## 2018-01-29 DIAGNOSIS — W19XXXA Unspecified fall, initial encounter: Secondary | ICD-10-CM | POA: Insufficient documentation

## 2018-01-29 DIAGNOSIS — E039 Hypothyroidism, unspecified: Secondary | ICD-10-CM | POA: Diagnosis not present

## 2018-01-29 DIAGNOSIS — I10 Essential (primary) hypertension: Secondary | ICD-10-CM | POA: Diagnosis not present

## 2018-01-29 DIAGNOSIS — Y9389 Activity, other specified: Secondary | ICD-10-CM | POA: Insufficient documentation

## 2018-01-29 DIAGNOSIS — G301 Alzheimer's disease with late onset: Secondary | ICD-10-CM | POA: Diagnosis not present

## 2018-01-29 DIAGNOSIS — Z9181 History of falling: Secondary | ICD-10-CM | POA: Insufficient documentation

## 2018-01-29 DIAGNOSIS — Y999 Unspecified external cause status: Secondary | ICD-10-CM | POA: Diagnosis not present

## 2018-01-29 DIAGNOSIS — Y92122 Bedroom in nursing home as the place of occurrence of the external cause: Secondary | ICD-10-CM | POA: Insufficient documentation

## 2018-01-29 DIAGNOSIS — Z87891 Personal history of nicotine dependence: Secondary | ICD-10-CM | POA: Diagnosis not present

## 2018-01-29 DIAGNOSIS — Z96641 Presence of right artificial hip joint: Secondary | ICD-10-CM | POA: Diagnosis not present

## 2018-01-29 DIAGNOSIS — S199XXA Unspecified injury of neck, initial encounter: Secondary | ICD-10-CM | POA: Diagnosis not present

## 2018-01-29 DIAGNOSIS — Z7982 Long term (current) use of aspirin: Secondary | ICD-10-CM | POA: Insufficient documentation

## 2018-01-29 DIAGNOSIS — F0390 Unspecified dementia without behavioral disturbance: Secondary | ICD-10-CM | POA: Diagnosis not present

## 2018-01-29 DIAGNOSIS — F5105 Insomnia due to other mental disorder: Secondary | ICD-10-CM | POA: Diagnosis not present

## 2018-01-29 DIAGNOSIS — Z79899 Other long term (current) drug therapy: Secondary | ICD-10-CM | POA: Insufficient documentation

## 2018-01-29 DIAGNOSIS — F0281 Dementia in other diseases classified elsewhere with behavioral disturbance: Secondary | ICD-10-CM | POA: Diagnosis not present

## 2018-01-29 DIAGNOSIS — F064 Anxiety disorder due to known physiological condition: Secondary | ICD-10-CM | POA: Diagnosis not present

## 2018-01-29 DIAGNOSIS — S0990XA Unspecified injury of head, initial encounter: Secondary | ICD-10-CM | POA: Diagnosis not present

## 2018-01-29 NOTE — ED Provider Notes (Signed)
[facility] EMERGENCY DEPARTMENT Provider Note   CSN: [id number] Arrival date & time: 01/29/18  1424     History   Chief Complaint Chief Complaint  Patient presents with  . Fall    HPI Diamond [name] is a 84 y.o. female.  Level 5 caveat secondary to dementia.  84 year old female resident at [facility] assisted living here after found on the floor.  Patient states she was getting out of bed and lost her balance and fell and then got herself up in a wheelchair and fell again.  She is not complaining of anything.  She denies LOC.  There is no numbness or weakness no chest pain no shortness of breath.  Patient's daughter is here and states she is got fairly advanced dementia and she probably does not remember anything about the accident in the chest with they told her Diamond [name].  She was having more frequent falls and they decided to take her off her narcotics which is helped somewhat.  This is the first since he took her off her pain medicine.  She attributes this to being in bed a lot and being stiff in the not waiting for any assistance to try to get up.  The history is provided by the patient and a relative.  Fall  This is a recurrent problem. The current episode started 1 to 2 hours ago. Pertinent negatives include no chest pain, no abdominal pain, no headaches and no shortness of breath. Nothing aggravates the symptoms. Nothing relieves the symptoms. She has tried nothing for the symptoms. The treatment provided no relief.    Past Medical History:  Diagnosis Date  . Arthritis    "everywhere"  . Dementia   . High cholesterol   . History of blood transfusion    "related to my hip OR"  . History of gout   . Hypertension   . Hypothyroidism   . Migraine    "q 5-6 years" (06/06/2014)    Patient Active Problem List   Diagnosis Date Noted  . High cholesterol   . UTI (lower urinary tract infection)   . Anemia 06/06/2014  . E. coli UTI 06/06/2014  . Generalized  weakness 06/06/2014  . Left humeral fracture 06/06/2014  . Depression 06/06/2014  . Hypothyroidism 06/06/2014  . Overactive bladder 06/06/2014  . Normocytic anemia 06/06/2014  . Frequent falls   . Bleeding gastrointestinal     Past Surgical History:  Procedure Laterality Date  . ABDOMINAL HYSTERECTOMY    . ANKLE FRACTURE SURGERY Right 1988   "crushed it"  . CHOLECYSTECTOMY  1970's  . DILATION AND CURETTAGE OF UTERUS    . FRACTURE SURGERY    . JOINT REPLACEMENT    . TOTAL HIP ARTHROPLASTY Right 1980's  . TUBAL LIGATION       OB History   None      Home Medications    Prior to Admission medications   Medication Sig Start Date End Date Taking? Authorizing Provider  acetaminophen (TYLENOL) 650 MG CR tablet Take 650 mg by mouth every 8 (eight) hours as needed for pain.    [provider]  aspirin EC 81 MG tablet Take 1 tablet (81 mg total) by mouth daily. 03/29/17   Pricilla Loveless, MD  calcium-vitamin D (OSCAL WITH D) 500-200 MG-UNIT tablet Take 1 tablet by mouth 2 (two) times daily.    [provider]  Cholecalciferol (VITAMIN D) 2000 units CAPS Take 2,000 Units by mouth daily.    [provider]  citalopram (CELEXA) 20 MG tablet Take 20 mg by mouth daily.    [provider]  diphenhydramine-acetaminophen (TYLENOL PM) 25-500 MG TABS tablet Take 1 tablet by mouth at bedtime as needed.    [provider]  divalproex (DEPAKOTE SPRINKLE) 125 MG capsule Take 125 mg by mouth 2 (two) times daily.    [provider]  divalproex (DEPAKOTE) 125 MG DR tablet Take 125 mg by mouth 3 (three) times daily.    [provider]  donepezil (ARICEPT) 10 MG tablet Take 10 mg by mouth at bedtime.    [provider]  guaiFENesin (ROBITUSSIN) 100 MG/5ML liquid Take 200 mg by mouth every 6 (six) hours as needed for cough.    [provider]  hydrocortisone 2.5 % cream Apply 1 application topically 2 (two) times daily.     [provider]  iron polysaccharides (NIFEREX) 150 MG capsule Take 150 mg by mouth daily.    [provider]  levothyroxine (SYNTHROID, LEVOTHROID) 50 MCG tablet Take 50 mcg by mouth daily before breakfast.    [provider]  loperamide (IMODIUM A-D) 2 MG tablet Take 2 mg by mouth 4 (four) times daily as needed for diarrhea or loose stools.    [provider]  meloxicam (MOBIC) 7.5 MG tablet Take 7.5 mg by mouth every 12 (twelve) hours as needed for pain.    [provider]  Multiple Vitamin (MULTIVITAMIN WITH MINERALS) TABS tablet Take 1 tablet by mouth daily.    [provider]  oxycodone-acetaminophen (PERCOCET) 2.5-325 MG tablet Take 1 tablet by mouth every 12 (twelve) hours as needed for pain.    [provider]  ranitidine (ZANTAC) 75 MG tablet Take 75 mg by mouth daily.    [provider]  simvastatin (ZOCOR) 40 MG tablet Take 40 mg by mouth at bedtime.     [provider]    Family History Family History  Problem Relation Age of Onset  . Heart attack Father     Social History Social History   Tobacco Use  . Smoking status: Former Smoker    Packs/day: 2.00    Years: 40.00    Pack years: 80.00    Types: Cigarettes    Last attempt to quit: 05/05/1987    Years since quitting: 30.7  . Smokeless tobacco: Never Used  Substance Use Topics  . Alcohol use: Yes    Alcohol/week: 4.2 oz    Types: 7 Glasses of wine per week  . Drug use: No     Allergies   Codeine; Aripiprazole; Penicillins; Prednisone; and Tramadol   Review of Systems Review of Systems  Unable to perform ROS: Dementia  Constitutional: Negative for fever.  HENT: Negative for sore throat.   Eyes: Negative for visual disturbance.  Respiratory: Negative for shortness of breath.   Cardiovascular: Negative for chest pain.  Gastrointestinal: Negative for abdominal pain.  Genitourinary: Negative for dysuria.  Musculoskeletal:  Negative for neck pain.  Skin: Negative for rash.  Neurological: Negative for headaches.     Physical Exam Updated Vital Signs BP (!) 151/62   Pulse 61   Resp 16   SpO2 98%   Physical Exam  Constitutional: She appears well-developed and well-nourished.  HENT:  Head: Normocephalic and atraumatic.  Eyes: Conjunctivae are normal.  Neck: Neck supple.  Cardiovascular: Normal rate, regular rhythm, normal heart sounds and intact distal pulses.  Pulmonary/Chest: Effort normal. No stridor. She has no wheezes. She has no rales.  Abdominal: Soft. She exhibits no mass. There is no tenderness. There is no guarding.  Musculoskeletal: She exhibits no tenderness or deformity.  Neurological: She is alert. She has normal strength. She is disoriented (To time). No sensory deficit. GCS eye subscore is 4. GCS verbal subscore is 5. GCS motor subscore is 6.  Skin: Skin is warm and dry.  Psychiatric: She has a normal mood and affect.  Nursing note and vitals reviewed.    ED Treatments / Results  Labs (all labs ordered are listed, but only abnormal results are displayed) Labs Reviewed - No data to display  EKG None  Radiology Ct Head Wo Contrast  Result Date: 01/29/2018 CLINICAL DATA:  84 year old female fell from wheelchair hitting back of head. No complaint of pain at this time. Initial encounter. EXAM: CT HEAD WITHOUT CONTRAST CT CERVICAL SPINE WITHOUT CONTRAST TECHNIQUE: Multidetector CT imaging of the head and cervical spine was performed following the standard protocol without intravenous contrast. Multiplanar CT image reconstructions of the cervical spine were also generated. COMPARISON:  12/13/2017 CT. FINDINGS: CT HEAD FINDINGS Brain: No intracranial hemorrhage or CT evidence of large acute infarct. Prominent chronic microvascular changes. Moderate global atrophy. No intracranial mass lesion noted on this unenhanced exam. Vascular: Vascular calcifications Skull: No skull fracture  Sinuses/Orbits: No acute orbital abnormality. Opacification left sphenoid sinus and posterior right ethmoid sinus air cell. Other: Mastoid air cells and middle ear cavities are clear. CT CERVICAL SPINE FINDINGS Alignment: Stable alignment. Skull base and vertebrae: No cervical spine fracture. Soft tissues and spinal canal: No abnormal prevertebral soft tissue swelling. Disc levels: Degenerative changes with various degrees of spinal stenosis and foraminal narrowing most notable C3-4 through C6-7. Prominent anterior osteophyte/DISH. Upper chest: No worrisome apical lung mass. Other: Right thyroid 1.5 cm nodule unchanged. Carotid bifurcation calcifications. IMPRESSION: 1. No skull fracture or intracranial hemorrhage. 2. No cervical spine fracture, malalignment or abnormal prevertebral soft tissue swelling. 3. Chronic changes as detailed above. Electronically Signed   By: Lacy Duverney M.D.   On: 01/29/2018 15:43   Ct Cervical Spine Wo Contrast  Result Date: 01/29/2018 CLINICAL DATA:  84 year old female fell from wheelchair hitting back of head. No complaint of pain at this time. Initial encounter. EXAM: CT HEAD WITHOUT CONTRAST CT CERVICAL SPINE WITHOUT CONTRAST TECHNIQUE: Multidetector CT imaging of the head and cervical spine was performed following the standard protocol without intravenous contrast. Multiplanar CT image reconstructions of the cervical spine were also generated. COMPARISON:  12/13/2017 CT. FINDINGS: CT HEAD FINDINGS Brain: No intracranial hemorrhage or CT evidence of large acute infarct. Prominent chronic microvascular changes. Moderate global atrophy. No intracranial mass lesion noted on this unenhanced exam. Vascular: Vascular calcifications Skull: No skull fracture Sinuses/Orbits: No acute orbital abnormality. Opacification left sphenoid sinus and posterior right ethmoid sinus air cell. Other: Mastoid air cells and middle ear cavities are clear. CT CERVICAL SPINE FINDINGS Alignment: Stable  alignment. Skull base and vertebrae: No cervical spine fracture. Soft tissues and spinal canal: No abnormal prevertebral soft tissue swelling. Disc levels: Degenerative changes with various degrees of spinal stenosis and foraminal narrowing most notable C3-4 through C6-7. Prominent anterior osteophyte/DISH. Upper chest: No worrisome apical lung mass. Other: Right thyroid 1.5 cm nodule unchanged. Carotid bifurcation calcifications. IMPRESSION: 1. No skull fracture or intracranial hemorrhage. 2. No cervical spine fracture, malalignment or abnormal prevertebral soft tissue swelling. 3. Chronic changes as detailed above. Electronically Signed   By: Lacy Duverney M.D.   On: 01/29/2018 15:43    Procedures Procedures (  including critical care time)  Medications Ordered in ED Medications - No data to display   Initial Impression / Assessment and Plan / ED Course  I have reviewed the triage vital signs and the nursing notes.  Pertinent labs & imaging results that were available during my care of the patient were reviewed by me and considered in my medical decision making (see chart for details).  Clinical Course as of Jan 29 2326  Mon Jan 29, 2018  [id number] 84 year old female with history of frequent falls last here about a month and a half ago for same.  She had what sounds like a mechanical fall at her facility.  There is no obvious signs of trauma to her.  Due to her age would get C-spines and head CT.  Daughter comfortable with her return to facility if no significant injury identified.   [MB]  1548 CT of head and C-spine do not show any acute findings.  Reviewed this with the patient and her daughter and the daughter is comfortable taking her back to the facility.   [MB]    Clinical Course User Index [MB] Terrilee FilesButler, Marjo Grosvenor C, MD    Final Clinical Impressions(s) / ED Diagnoses   Final diagnoses:  Fall, initial encounter    ED Discharge Orders    None       Terrilee FilesButler, Jaliya Siegmann C, MD 01/29/18  2328

## 2018-01-29 NOTE — ED Triage Notes (Signed)
Pt to room 7 by ems, per ems pt found on floor at [facility]. Pt states "I think I fell." pt is pleasantly confused, states "I was on my way to the car to go to the doctor for a check up." pt is smiling in nad, denies any pain or other c/o. Pt is collared, unable to clear c-spines in the field due to baseline ms confused.

## 2018-01-29 NOTE — Discharge Instructions (Signed)
You were evaluated in the emergency department after a fall at home.  You had a CAT scan of your head and cervical spine that did not show any obvious injury.  It will be important for you to follow-up with your regular doctor, and return to the emergency department if any concerns.

## 2018-01-31 DIAGNOSIS — F333 Major depressive disorder, recurrent, severe with psychotic symptoms: Secondary | ICD-10-CM | POA: Diagnosis not present

## 2018-01-31 DIAGNOSIS — R296 Repeated falls: Secondary | ICD-10-CM | POA: Diagnosis not present

## 2018-01-31 DIAGNOSIS — F419 Anxiety disorder, unspecified: Secondary | ICD-10-CM | POA: Diagnosis not present

## 2018-01-31 DIAGNOSIS — F0281 Dementia in other diseases classified elsewhere with behavioral disturbance: Secondary | ICD-10-CM | POA: Diagnosis not present

## 2018-02-15 DIAGNOSIS — S80219A Abrasion, unspecified knee, initial encounter: Secondary | ICD-10-CM | POA: Diagnosis not present

## 2018-02-15 DIAGNOSIS — F333 Major depressive disorder, recurrent, severe with psychotic symptoms: Secondary | ICD-10-CM | POA: Diagnosis not present

## 2018-02-15 DIAGNOSIS — R296 Repeated falls: Secondary | ICD-10-CM | POA: Diagnosis not present

## 2018-02-15 DIAGNOSIS — F0281 Dementia in other diseases classified elsewhere with behavioral disturbance: Secondary | ICD-10-CM | POA: Diagnosis not present

## 2018-02-21 DIAGNOSIS — D631 Anemia in chronic kidney disease: Secondary | ICD-10-CM | POA: Diagnosis not present

## 2018-02-21 DIAGNOSIS — S80219A Abrasion, unspecified knee, initial encounter: Secondary | ICD-10-CM | POA: Diagnosis not present

## 2018-02-21 DIAGNOSIS — E785 Hyperlipidemia, unspecified: Secondary | ICD-10-CM | POA: Diagnosis not present

## 2018-02-21 DIAGNOSIS — G8929 Other chronic pain: Secondary | ICD-10-CM | POA: Diagnosis not present

## 2018-02-21 DIAGNOSIS — E559 Vitamin D deficiency, unspecified: Secondary | ICD-10-CM | POA: Diagnosis not present

## 2018-02-21 DIAGNOSIS — R05 Cough: Secondary | ICD-10-CM | POA: Diagnosis not present

## 2018-02-21 DIAGNOSIS — F0281 Dementia in other diseases classified elsewhere with behavioral disturbance: Secondary | ICD-10-CM | POA: Diagnosis not present

## 2018-02-21 DIAGNOSIS — K219 Gastro-esophageal reflux disease without esophagitis: Secondary | ICD-10-CM | POA: Diagnosis not present

## 2018-02-21 DIAGNOSIS — E039 Hypothyroidism, unspecified: Secondary | ICD-10-CM | POA: Diagnosis not present

## 2018-02-21 DIAGNOSIS — R197 Diarrhea, unspecified: Secondary | ICD-10-CM | POA: Diagnosis not present

## 2018-02-21 DIAGNOSIS — R296 Repeated falls: Secondary | ICD-10-CM | POA: Diagnosis not present

## 2018-02-21 DIAGNOSIS — G4700 Insomnia, unspecified: Secondary | ICD-10-CM | POA: Diagnosis not present

## 2018-02-22 DIAGNOSIS — E039 Hypothyroidism, unspecified: Secondary | ICD-10-CM | POA: Diagnosis not present

## 2018-02-22 DIAGNOSIS — R300 Dysuria: Secondary | ICD-10-CM | POA: Diagnosis not present

## 2018-02-26 ENCOUNTER — Other Ambulatory Visit: Payer: Self-pay

## 2018-02-26 ENCOUNTER — Encounter (HOSPITAL_BASED_OUTPATIENT_CLINIC_OR_DEPARTMENT_OTHER): Payer: Self-pay | Admitting: *Deleted

## 2018-02-26 ENCOUNTER — Emergency Department (HOSPITAL_BASED_OUTPATIENT_CLINIC_OR_DEPARTMENT_OTHER)
Admission: EM | Admit: 2018-02-26 | Discharge: 2018-02-26 | Disposition: A | Discharge status: Home / Self Care | Payer: Medicare Other | Attending: Emergency Medicine | Admitting: Emergency Medicine

## 2018-02-26 ENCOUNTER — Emergency Department (HOSPITAL_BASED_OUTPATIENT_CLINIC_OR_DEPARTMENT_OTHER): Payer: Medicare Other

## 2018-02-26 DIAGNOSIS — E039 Hypothyroidism, unspecified: Secondary | ICD-10-CM | POA: Diagnosis not present

## 2018-02-26 DIAGNOSIS — S0990XA Unspecified injury of head, initial encounter: Secondary | ICD-10-CM | POA: Diagnosis not present

## 2018-02-26 DIAGNOSIS — F39 Unspecified mood [affective] disorder: Secondary | ICD-10-CM | POA: Diagnosis not present

## 2018-02-26 DIAGNOSIS — F0281 Dementia in other diseases classified elsewhere with behavioral disturbance: Secondary | ICD-10-CM | POA: Diagnosis not present

## 2018-02-26 DIAGNOSIS — Y92129 Unspecified place in nursing home as the place of occurrence of the external cause: Secondary | ICD-10-CM | POA: Insufficient documentation

## 2018-02-26 DIAGNOSIS — D631 Anemia in chronic kidney disease: Secondary | ICD-10-CM | POA: Diagnosis not present

## 2018-02-26 DIAGNOSIS — Y9389 Activity, other specified: Secondary | ICD-10-CM | POA: Insufficient documentation

## 2018-02-26 DIAGNOSIS — N3000 Acute cystitis without hematuria: Secondary | ICD-10-CM

## 2018-02-26 DIAGNOSIS — G301 Alzheimer's disease with late onset: Secondary | ICD-10-CM | POA: Diagnosis not present

## 2018-02-26 DIAGNOSIS — Z7982 Long term (current) use of aspirin: Secondary | ICD-10-CM | POA: Insufficient documentation

## 2018-02-26 DIAGNOSIS — Z9049 Acquired absence of other specified parts of digestive tract: Secondary | ICD-10-CM | POA: Diagnosis not present

## 2018-02-26 DIAGNOSIS — I10 Essential (primary) hypertension: Secondary | ICD-10-CM | POA: Diagnosis not present

## 2018-02-26 DIAGNOSIS — W0110XA Fall on same level from slipping, tripping and stumbling with subsequent striking against unspecified object, initial encounter: Secondary | ICD-10-CM | POA: Insufficient documentation

## 2018-02-26 DIAGNOSIS — E785 Hyperlipidemia, unspecified: Secondary | ICD-10-CM | POA: Diagnosis not present

## 2018-02-26 DIAGNOSIS — F064 Anxiety disorder due to known physiological condition: Secondary | ICD-10-CM | POA: Diagnosis not present

## 2018-02-26 DIAGNOSIS — Y998 Other external cause status: Secondary | ICD-10-CM | POA: Insufficient documentation

## 2018-02-26 DIAGNOSIS — F329 Major depressive disorder, single episode, unspecified: Secondary | ICD-10-CM | POA: Insufficient documentation

## 2018-02-26 DIAGNOSIS — F0390 Unspecified dementia without behavioral disturbance: Secondary | ICD-10-CM | POA: Diagnosis not present

## 2018-02-26 DIAGNOSIS — Z87891 Personal history of nicotine dependence: Secondary | ICD-10-CM | POA: Insufficient documentation

## 2018-02-26 DIAGNOSIS — S199XXA Unspecified injury of neck, initial encounter: Secondary | ICD-10-CM | POA: Diagnosis not present

## 2018-02-26 DIAGNOSIS — Z96641 Presence of right artificial hip joint: Secondary | ICD-10-CM | POA: Diagnosis not present

## 2018-02-26 DIAGNOSIS — F419 Anxiety disorder, unspecified: Secondary | ICD-10-CM | POA: Diagnosis not present

## 2018-02-26 DIAGNOSIS — W19XXXA Unspecified fall, initial encounter: Secondary | ICD-10-CM

## 2018-02-26 DIAGNOSIS — Z79899 Other long term (current) drug therapy: Secondary | ICD-10-CM | POA: Insufficient documentation

## 2018-02-26 DIAGNOSIS — E559 Vitamin D deficiency, unspecified: Secondary | ICD-10-CM | POA: Diagnosis not present

## 2018-02-26 DIAGNOSIS — F5105 Insomnia due to other mental disorder: Secondary | ICD-10-CM | POA: Diagnosis not present

## 2018-02-26 DIAGNOSIS — F333 Major depressive disorder, recurrent, severe with psychotic symptoms: Secondary | ICD-10-CM | POA: Diagnosis not present

## 2018-02-26 DIAGNOSIS — G3184 Mild cognitive impairment, so stated: Secondary | ICD-10-CM | POA: Diagnosis not present

## 2018-02-26 DIAGNOSIS — G4700 Insomnia, unspecified: Secondary | ICD-10-CM | POA: Diagnosis not present

## 2018-02-26 LAB — URINALYSIS, MICROSCOPIC (REFLEX)

## 2018-02-26 LAB — URINALYSIS, ROUTINE W REFLEX MICROSCOPIC
Bilirubin Urine: NEGATIVE
Glucose, UA: NEGATIVE mg/dL
Hgb urine dipstick: NEGATIVE
Ketones, ur: NEGATIVE mg/dL
NITRITE: NEGATIVE
PH: 6 (ref 5.0–8.0)
Protein, ur: NEGATIVE mg/dL
SPECIFIC GRAVITY, URINE: 1.015 (ref 1.005–1.030)

## 2018-02-26 MED ORDER — CIPROFLOXACIN HCL 500 MG PO TABS
500.0000 mg | ORAL_TABLET | Freq: Once | ORAL | Status: AC
  Administered 2018-02-26: 500 mg via ORAL
  Filled 2018-02-26: qty 1

## 2018-02-26 MED ORDER — CIPROFLOXACIN HCL 500 MG PO TABS: 500.0000 mg | ORAL_TABLET | Freq: Two times a day (BID) | ORAL | 0 refills | Status: DC

## 2018-02-26 NOTE — ED Notes (Signed)
ED Provider at bedside. 

## 2018-02-26 NOTE — ED Notes (Signed)
Pts daughter came to nursing station to ask when the CT would be done. Daughter said "It doesn't look like you are very busy."  Stated that the pt is picking at the collar and wants it off. EDP said we cannot take it off until after the CT.  CT staff said they have someone on the scanner and this pt is next. Pt and daughter notified.

## 2018-02-26 NOTE — Discharge Instructions (Signed)
Work-up with a fall without evidence of any significant head injury skull fracture or brain injury.  Also did CT scan of neck without any bony injuries there.  Patient's urine here just of urinary tract infection.  Culture sent.  In the meantime we will start on Cipro 500 mg twice a day for the next 7 days.

## 2018-02-26 NOTE — ED Triage Notes (Signed)
Per ems pt had witnessed fall at [facility]. Pt is smiling, denies any c/o. c collar in place due to history of dementia.

## 2018-02-26 NOTE — ED Notes (Signed)
ED Provider at bedside. C-Collar removed. Discussing test results and plan of care.

## 2018-02-26 NOTE — ED Notes (Signed)
Patient transported to CT 

## 2018-02-26 NOTE — ED Provider Notes (Signed)
[facility] EMERGENCY DEPARTMENT Provider Note   CSN: [id number] Arrival date & time: 02/26/18  1451     History   Chief Complaint Chief Complaint  Patient presents with  . Fall    HPI Diamond [name] is a 84 y.o. female.  Patient brought in by EMS [facility].  Patient currently in assisted living but she is in the process of being moved to memory care due to worsening dementia.  Patient had c-collar on upon arrival.  Patient has had a witnessed fall.  Apparently did hit her head.  Patient here without any specific complaints.     Past Medical History:  Diagnosis Date  . Arthritis    "everywhere"  . Dementia   . High cholesterol   . History of blood transfusion    "related to my hip OR"  . History of gout   . Hypertension   . Hypothyroidism   . Migraine    "q 5-6 years" (06/06/2014)    Patient Active Problem List   Diagnosis Date Noted  . High cholesterol   . UTI (lower urinary tract infection)   . Anemia 06/06/2014  . E. coli UTI 06/06/2014  . Generalized weakness 06/06/2014  . Left humeral fracture 06/06/2014  . Depression 06/06/2014  . Hypothyroidism 06/06/2014  . Overactive bladder 06/06/2014  . Normocytic anemia 06/06/2014  . Frequent falls   . Bleeding gastrointestinal     Past Surgical History:  Procedure Laterality Date  . ABDOMINAL HYSTERECTOMY    . ANKLE FRACTURE SURGERY Right 1988   "crushed it"  . CHOLECYSTECTOMY  1970's  . DILATION AND CURETTAGE OF UTERUS    . FRACTURE SURGERY    . JOINT REPLACEMENT    . TOTAL HIP ARTHROPLASTY Right 1980's  . TUBAL LIGATION       OB History   None      Home Medications    Prior to Admission medications   Medication Sig Start Date End Date Taking? Authorizing Provider  acetaminophen (TYLENOL) 650 MG CR tablet Take 650 mg by mouth every 8 (eight) hours as needed for pain.    [provider]  aspirin EC 81 MG tablet Take 1 tablet (81 mg total) by mouth  daily. 03/29/17   Pricilla LovelessGoldston, Lexianna Weinrich, MD  calcium-vitamin D (OSCAL WITH D) 500-200 MG-UNIT tablet Take 1 tablet by mouth 2 (two) times daily.    [provider]  Cholecalciferol (VITAMIN D) 2000 units CAPS Take 2,000 Units by mouth daily.    [provider]  ciprofloxacin (CIPRO) 500 MG tablet Take 1 tablet (500 mg total) by mouth 2 (two) times daily. 02/26/18   Vanetta MuldersZackowski, Ladiamond Gallina, MD  citalopram (CELEXA) 20 MG tablet Take 20 mg by mouth daily.    [provider]  diphenhydramine-acetaminophen (TYLENOL PM) 25-500 MG TABS tablet Take 1 tablet by mouth at bedtime as needed.    [provider]  divalproex (DEPAKOTE SPRINKLE) 125 MG capsule Take 125 mg by mouth 2 (two) times daily.    [provider]  divalproex (DEPAKOTE) 125 MG DR tablet Take 125 mg by mouth 3 (three) times daily.    [provider]  donepezil (ARICEPT) 10 MG tablet Take 10 mg by mouth at bedtime.    [provider]  guaiFENesin (ROBITUSSIN) 100 MG/5ML liquid Take 200 mg by mouth every 6 (six) hours as needed for cough.    [provider]  hydrocortisone 2.5 % cream Apply 1 application topically 2 (two)  times daily.    [provider]  iron polysaccharides (NIFEREX) 150 MG capsule Take 150 mg by mouth daily.    [provider]  levothyroxine (SYNTHROID, LEVOTHROID) 50 MCG tablet Take 50 mcg by mouth daily before breakfast.    [provider]  loperamide (IMODIUM A-D) 2 MG tablet Take 2 mg by mouth 4 (four) times daily as needed for diarrhea or loose stools.    [provider]  meloxicam (MOBIC) 7.5 MG tablet Take 7.5 mg by mouth every 12 (twelve) hours as needed for pain.    [provider]  Multiple Vitamin (MULTIVITAMIN WITH MINERALS) TABS tablet Take 1 tablet by mouth daily.    [provider]  oxycodone-acetaminophen (PERCOCET) 2.5-325 MG tablet Take 1 tablet by mouth every 12 (twelve) hours as needed for pain.     [provider]  ranitidine (ZANTAC) 75 MG tablet Take 75 mg by mouth daily.    [provider]  simvastatin (ZOCOR) 40 MG tablet Take 40 mg by mouth at bedtime.     [provider]    Family History Family History  Problem Relation Age of Onset  . Heart attack Father     Social History Social History   Tobacco Use  . Smoking status: Former Smoker    Packs/day: 2.00    Years: 40.00    Pack years: 80.00    Types: Cigarettes    Last attempt to quit: 05/05/1987    Years since quitting: 30.8  . Smokeless tobacco: Never Used  Substance Use Topics  . Alcohol use: Yes    Alcohol/week: 7.0 standard drinks    Types: 7 Glasses of wine per week  . Drug use: No     Allergies   Codeine; Aripiprazole; Penicillins; Prednisone; and Tramadol   Review of Systems Review of Systems  Unable to perform ROS: Dementia     Physical Exam Updated Vital Signs BP (!) 182/72 (BP Location: Right Arm)   Pulse 69   Temp 97.9 F (36.6 C) (Oral)   Resp 20   SpO2 99%   Physical Exam  Constitutional: She appears well-developed and well-nourished. No distress.  HENT:  Head: Normocephalic and atraumatic.  Mouth/Throat: Oropharynx is clear and moist.  No obvious evidence of trauma.  Eyes: Pupils are equal, round, and reactive to light. Conjunctivae and EOM are normal.  Neck:  Cervical collar on.  Cardiovascular: Normal rate, regular rhythm and normal heart sounds.  Pulmonary/Chest: Effort normal and breath sounds normal. No respiratory distress. She exhibits no tenderness.  Abdominal: Soft. Bowel sounds are normal. There is no tenderness.  Musculoskeletal: Normal range of motion. She exhibits no edema, tenderness or deformity.  Range of motion of upper extremities and lower extremities without evidence of any injury.  Legs in particular no pain at the hips with range of motion no shortening of 1 of the legs.  Dorsalis pedis pulse distally is 1+ bilaterally.    Neurological: She is alert. No cranial nerve deficit or sensory deficit. She exhibits normal muscle tone. Coordination normal.  Skin: Skin is warm.  Nursing note and vitals reviewed.    ED Treatments / Results  Labs (all labs ordered are listed, but only abnormal results are displayed) Labs Reviewed  URINALYSIS, ROUTINE W REFLEX MICROSCOPIC - Abnormal; Notable for the following components:      Result Value   APPearance CLOUDY (*)    Leukocytes, UA TRACE (*)    All other components within normal limits  URINALYSIS, MICROSCOPIC (REFLEX) - Abnormal; Notable for the following components:   Bacteria, UA MANY (*)    All other components within normal limits  URINE CULTURE    EKG None  Radiology Ct Head Wo Contrast  Result Date: 02/26/2018 CLINICAL DATA:  Patient arrived with dementia. Witnessed fall. No current complaints. Possible cervical spine injury. EXAM: CT HEAD WITHOUT CONTRAST CT CERVICAL SPINE WITHOUT CONTRAST TECHNIQUE: Multidetector CT imaging of the head and cervical spine was performed following the standard protocol without intravenous contrast. Multiplanar CT image reconstructions of the cervical spine were also generated. COMPARISON:  Multiple priors, most recent 01/29/2018. FINDINGS: The patient was unable to remain motionless for the exam. Small or subtle lesions could be overlooked. CT HEAD FINDINGS Brain: No intracranial hemorrhage or CT evidence of large acute infarct. Prominent chronic microvascular changes. Moderate global atrophy. No intracranial mass lesion noted on this unenhanced exam. Vascular: Vascular calcifications Skull: No skull fracture Sinuses/Orbits: No acute orbital abnormality. Opacification left sphenoid sinus and posterior right ethmoid sinus air cell. Other: Mastoid air cells and middle ear cavities are clear. CT CERVICAL SPINE FINDINGS Alignment: Stable alignment. Skull base and vertebrae: No cervical spine fracture. Soft tissues and spinal canal: No  abnormal prevertebral soft tissue swelling.  Mild pannus. Disc levels: Degenerative changes with various degrees of spinal stenosis and foraminal narrowing most notable C3-4 through C6-7. Prominent anterior osteophyte/DISH.  Multilevel facet arthropathy. Upper chest: No worrisome apical lung mass. Other: Right thyroid 1.5 cm nodule unchanged. Carotid bifurcation calcifications. IMPRESSION: Global atrophy.  No skull fracture or intracranial hemorrhage. Advanced cervical spondylosis. No cervical spine fracture or traumatic subluxation. Electronically Signed   By: Elsie Stain M.D.   On: 02/26/2018 17:12   Ct Cervical Spine Wo Contrast  Result Date: 02/26/2018 CLINICAL DATA:  Patient arrived with dementia. Witnessed fall. No current complaints. Possible cervical spine injury. EXAM: CT HEAD WITHOUT CONTRAST CT CERVICAL SPINE WITHOUT CONTRAST TECHNIQUE: Multidetector CT imaging of the head and cervical spine was performed following the standard protocol without intravenous contrast. Multiplanar CT image reconstructions of the cervical spine were also generated. COMPARISON:  Multiple priors, most recent 01/29/2018. FINDINGS: The patient was unable to remain motionless for the exam. Small or subtle lesions could be overlooked. CT HEAD FINDINGS Brain: No intracranial hemorrhage or CT evidence of large acute infarct. Prominent chronic microvascular changes. Moderate global atrophy. No intracranial mass lesion noted on this unenhanced exam. Vascular: Vascular calcifications Skull: No skull fracture Sinuses/Orbits: No acute orbital abnormality. Opacification left sphenoid sinus and posterior right ethmoid sinus air cell. Other: Mastoid air cells and middle ear cavities are clear. CT CERVICAL SPINE FINDINGS Alignment: Stable alignment. Skull base and vertebrae: No cervical spine fracture. Soft tissues and spinal canal: No abnormal prevertebral soft tissue swelling.  Mild pannus. Disc levels: Degenerative changes with  various degrees of spinal stenosis and foraminal narrowing most notable C3-4 through C6-7. Prominent anterior osteophyte/DISH.  Multilevel facet arthropathy. Upper chest: No worrisome apical lung mass. Other: Right thyroid 1.5 cm nodule unchanged. Carotid bifurcation calcifications. IMPRESSION: Global atrophy.  No skull fracture or intracranial hemorrhage. Advanced cervical spondylosis. No cervical spine fracture or traumatic subluxation. Electronically Signed   By: Elsie Stain M.D.   On: 02/26/2018 17:12    Procedures Procedures (including critical care time)  Medications Ordered in ED Medications  ciprofloxacin (CIPRO) tablet 500 mg (has no administration in time range)     Initial Impression / Assessment and Plan / ED Course  I have reviewed the triage  vital signs and the nursing notes.  Pertinent labs & imaging results that were available during my care of the patient were reviewed by me and considered in my medical decision making (see chart for details).     Patient without any evidence of any specific trauma.  But based on the history of the witnessed fall hitting her head.  Based on this she had CT head neck.  Both were negative.  No outward evidence of any injury.  Patient's urine here seems strong so it was sent for urinalysis and culture.  Raises some concerns for possible urinary tract infection.  So will be started on Cipro here is a strong penicillin allergy.  First dose of Cipro here prescription provided for [facility] to give her at the facility.  Patient nontoxic no acute distress.  Final Clinical Impressions(s) / ED Diagnoses   Final diagnoses:  Fall, initial encounter  Injury of head, initial encounter  Acute cystitis without hematuria    ED Discharge Orders         Ordered    ciprofloxacin (CIPRO) 500 MG tablet  2 times daily     02/26/18 1725           Vanetta Mulders, MD 02/26/18 1736

## 2018-03-01 ENCOUNTER — Encounter (HOSPITAL_BASED_OUTPATIENT_CLINIC_OR_DEPARTMENT_OTHER): Payer: Self-pay | Admitting: *Deleted

## 2018-03-01 ENCOUNTER — Emergency Department (HOSPITAL_BASED_OUTPATIENT_CLINIC_OR_DEPARTMENT_OTHER)
Admission: EM | Admit: 2018-03-01 | Discharge: 2018-03-01 | Disposition: A | Discharge status: Home / Self Care | Payer: Medicare Other | Attending: Emergency Medicine | Admitting: Emergency Medicine

## 2018-03-01 ENCOUNTER — Other Ambulatory Visit: Payer: Self-pay

## 2018-03-01 DIAGNOSIS — E039 Hypothyroidism, unspecified: Secondary | ICD-10-CM | POA: Diagnosis not present

## 2018-03-01 DIAGNOSIS — Z9049 Acquired absence of other specified parts of digestive tract: Secondary | ICD-10-CM | POA: Diagnosis not present

## 2018-03-01 DIAGNOSIS — Z7982 Long term (current) use of aspirin: Secondary | ICD-10-CM | POA: Insufficient documentation

## 2018-03-01 DIAGNOSIS — Z87891 Personal history of nicotine dependence: Secondary | ICD-10-CM | POA: Insufficient documentation

## 2018-03-01 DIAGNOSIS — Z79899 Other long term (current) drug therapy: Secondary | ICD-10-CM | POA: Insufficient documentation

## 2018-03-01 DIAGNOSIS — S0990XA Unspecified injury of head, initial encounter: Secondary | ICD-10-CM | POA: Diagnosis not present

## 2018-03-01 DIAGNOSIS — I10 Essential (primary) hypertension: Secondary | ICD-10-CM | POA: Diagnosis not present

## 2018-03-01 DIAGNOSIS — Y9389 Activity, other specified: Secondary | ICD-10-CM | POA: Diagnosis not present

## 2018-03-01 DIAGNOSIS — W06XXXA Fall from bed, initial encounter: Secondary | ICD-10-CM | POA: Insufficient documentation

## 2018-03-01 DIAGNOSIS — Z96641 Presence of right artificial hip joint: Secondary | ICD-10-CM | POA: Insufficient documentation

## 2018-03-01 DIAGNOSIS — Y92003 Bedroom of unspecified non-institutional (private) residence as the place of occurrence of the external cause: Secondary | ICD-10-CM | POA: Insufficient documentation

## 2018-03-01 DIAGNOSIS — W19XXXA Unspecified fall, initial encounter: Secondary | ICD-10-CM | POA: Diagnosis not present

## 2018-03-01 DIAGNOSIS — F0390 Unspecified dementia without behavioral disturbance: Secondary | ICD-10-CM | POA: Insufficient documentation

## 2018-03-01 DIAGNOSIS — Y998 Other external cause status: Secondary | ICD-10-CM | POA: Diagnosis not present

## 2018-03-01 DIAGNOSIS — G3184 Mild cognitive impairment, so stated: Secondary | ICD-10-CM | POA: Diagnosis not present

## 2018-03-01 LAB — URINE CULTURE: Culture: >=100000 — AB

## 2018-03-01 NOTE — ED Notes (Signed)
Pt daughter states she can't wait all day and is concerned about the wait time. I explained to her that the patient just got here less than 20 min ago and the doctor will be in shortly.

## 2018-03-01 NOTE — ED Provider Notes (Signed)
[facility] EMERGENCY DEPARTMENT Provider Note   CSN: [id number] Arrival date & time: 03/01/18  1059     History   Chief Complaint Chief Complaint  Patient presents with  . Fall    HPI Diamond [name] is a 84 y.o. female. Level 5 caveat due to dementia. HPI Patient presents after fall.  Reportedly rolled out of her bed.  Hematoma to back of her head.  History of falls and was seen in the ER 2 days ago.  No change in her baseline confusion.  No other complaints.  She is not on anticoagulation. Past Medical History:  Diagnosis Date  . Arthritis    "everywhere"  . Dementia   . High cholesterol   . History of blood transfusion    "related to my hip OR"  . History of gout   . Hypertension   . Hypothyroidism   . Migraine    "q 5-6 years" (06/06/2014)    Patient Active Problem List   Diagnosis Date Noted  . High cholesterol   . UTI (lower urinary tract infection)   . Anemia 06/06/2014  . E. coli UTI 06/06/2014  . Generalized weakness 06/06/2014  . Left humeral fracture 06/06/2014  . Depression 06/06/2014  . Hypothyroidism 06/06/2014  . Overactive bladder 06/06/2014  . Normocytic anemia 06/06/2014  . Frequent falls   . Bleeding gastrointestinal     Past Surgical History:  Procedure Laterality Date  . ABDOMINAL HYSTERECTOMY    . ANKLE FRACTURE SURGERY Right 1988   "crushed it"  . CHOLECYSTECTOMY  1970's  . DILATION AND CURETTAGE OF UTERUS    . FRACTURE SURGERY    . JOINT REPLACEMENT    . TOTAL HIP ARTHROPLASTY Right 1980's  . TUBAL LIGATION       OB History   None      Home Medications    Prior to Admission medications   Medication Sig Start Date End Date Taking? Authorizing Provider  acetaminophen (TYLENOL) 650 MG CR tablet Take 650 mg by mouth every 8 (eight) hours as needed for pain.    [provider]  aspirin EC 81 MG tablet Take 1 tablet (81 mg total) by mouth daily. 03/29/17   Pricilla Loveless, MD  calcium-vitamin D (OSCAL  WITH D) 500-200 MG-UNIT tablet Take 1 tablet by mouth 2 (two) times daily.    [provider]  Cholecalciferol (VITAMIN D) 2000 units CAPS Take 2,000 Units by mouth daily.    [provider]  ciprofloxacin (CIPRO) 500 MG tablet Take 1 tablet (500 mg total) by mouth 2 (two) times daily. 02/26/18   Vanetta Mulders, MD  citalopram (CELEXA) 20 MG tablet Take 20 mg by mouth daily.    [provider]  diphenhydramine-acetaminophen (TYLENOL PM) 25-500 MG TABS tablet Take 1 tablet by mouth at bedtime as needed.    [provider]  divalproex (DEPAKOTE SPRINKLE) 125 MG capsule Take 125 mg by mouth 2 (two) times daily.    [provider]  divalproex (DEPAKOTE) 125 MG DR tablet Take 125 mg by mouth 3 (three) times daily.    [provider]  donepezil (ARICEPT) 10 MG tablet Take 10 mg by mouth at bedtime.    [provider]  guaiFENesin (ROBITUSSIN) 100 MG/5ML liquid Take 200 mg by mouth every 6 (six) hours as needed for cough.    [provider]  hydrocortisone 2.5 % cream Apply 1 application topically 2 (two) times daily.    [provider]  iron polysaccharides (NIFEREX) 150 MG capsule Take 150 mg by mouth daily.    [provider]  levothyroxine (SYNTHROID, LEVOTHROID) 50 MCG tablet Take 50 mcg by mouth daily before breakfast.    [provider]  loperamide (IMODIUM A-D) 2 MG tablet Take 2 mg by mouth 4 (four) times daily as needed for diarrhea or loose stools.    [provider]  meloxicam (MOBIC) 7.5 MG tablet Take 7.5 mg by mouth every 12 (twelve) hours as needed for pain.    [provider]  Multiple Vitamin (MULTIVITAMIN WITH MINERALS) TABS tablet Take 1 tablet by mouth daily.    [provider]  oxycodone-acetaminophen (PERCOCET) 2.5-325 MG tablet Take 1 tablet by mouth every 12 (twelve) hours as needed for pain.    [provider]  ranitidine (ZANTAC) 75 MG tablet Take  75 mg by mouth daily.    [provider]  simvastatin (ZOCOR) 40 MG tablet Take 40 mg by mouth at bedtime.     [provider]    Family History Family History  Problem Relation Age of Onset  . Heart attack Father     Social History Social History   Tobacco Use  . Smoking status: Former Smoker    Packs/day: 2.00    Years: 40.00    Pack years: 80.00    Types: Cigarettes    Last attempt to quit: 05/05/1987    Years since quitting: 30.8  . Smokeless tobacco: Never Used  Substance Use Topics  . Alcohol use: Yes    Alcohol/week: 7.0 standard drinks    Types: 7 Glasses of wine per week  . Drug use: No     Allergies   Codeine; Aripiprazole; Penicillins; Prednisone; and Tramadol   Review of Systems Review of Systems  Unable to perform ROS: Dementia     Physical Exam Updated Vital Signs BP (!) 158/69 (BP Location: Right Arm)   Pulse 62   Temp 98 F (36.7 C) (Oral)   Resp 18   SpO2 100%   Physical Exam  Constitutional: She appears well-developed.  HENT:  Hematoma to occipital area.  No underlying tenderness.  Eyes: Pupils are equal, round, and reactive to light.  Pupils mildly constricted but reactive.  Neck: Neck supple.  Cardiovascular: Normal rate.  Pulmonary/Chest: Effort normal.  Abdominal: There is no tenderness.  Musculoskeletal: She exhibits no tenderness.  Neurological: She is alert.  Awake and pleasant but at her baseline mild confusion.  Skin: Skin is warm. Capillary refill takes less than 2 seconds.     ED Treatments / Results  Labs (all labs ordered are listed, but only abnormal results are displayed) Labs Reviewed - No data to display  EKG None  Radiology No results found.  Procedures Procedures (including critical care time)  Medications Ordered in ED Medications - No data to display   Initial Impression / Assessment and Plan / ED Course  I have reviewed the triage vital signs and the nursing  notes.  Pertinent labs & imaging results that were available during my care of the patient were reviewed by me and considered in my medical decision making (see chart for details).     Patient with fall.  Hematoma in the back of her head.  Discussed with patient's daughter, is her power of attorney.  She is at her baseline and since it is around noon she can be monitored at the assisted living for a while longer.  No head CT at this time.  Reviewed labs from recent visit including culture of her urine.  Will discharge back.  Final Clinical Impressions(s) / ED Diagnoses   Final diagnoses:  Minor head injury, initial encounter  Fall, initial encounter    ED Discharge Orders    None       Benjiman Core, MD 03/01/18 1230

## 2018-03-01 NOTE — ED Triage Notes (Signed)
Pt to room 11 by ems, per ems pt rolled out of her bed at [facility], hematoma noted to posterior head by ems. Pt is a/a/o to baseline, pleasantly confused.

## 2018-03-02 ENCOUNTER — Telehealth: Payer: Self-pay | Admitting: *Deleted

## 2018-03-02 NOTE — Telephone Encounter (Signed)
Post ED Visit - Positive Culture Follow-up: Unsuccessful Patient Follow-up  Culture assessed and recommendations reviewed by:  []  Enzo BiNathan Batchelder, Pharm.D. []  Celedonio MiyamotoJeremy Frens, Pharm.D., BCPS AQ-ID []  Garvin FilaMike Maccia, Pharm.D., BCPS []  Georgina PillionElizabeth Martin, Pharm.D., BCPS []  DawnMinh Pham, [address]Pharm.D., BCPS, AAHIVP []  Estella HuskMichelle Turner, Pharm.D., BCPS, AAHIVP []  Sherlynn CarbonAustin Lucas, PharmD []  Pollyann SamplesAndy Johnston, PharmD, BCPS Jairo Benachel Rumberger, PharmD  Positive urine culture, reviewed by Harlene SaltsBrandon Morelli, PA-C  []  Patient discharged without antimicrobial prescription and treatment is now indicated [x]  Organism is resistant to prescribed ED discharge antimicrobial []  Patient with positive blood cultures  Plan: D/C Cipro and begin Cephalexin 500mg  PO BID x 7 days  Unable to contact patient after 3 attempts, letter will be sent to address on file  Lysle PearlRobertson, Momo Braun Talley 03/02/2018, 10:23 AM

## 2018-03-02 NOTE — Progress Notes (Signed)
ED Antimicrobial Stewardship Positive Culture Follow Up   Deloise A Milinda CaveDesanto is an [age] y.o. female who [facility] with a chief complaint of  Chief Complaint  Patient presents with  . Fall    Recent Results (from the past 720 hour(s))  Urine Culture     Status: Abnormal   Collection Time: 02/26/18  4:45 PM  Result Value Ref Range Status   Specimen Description   Final    URINE, RANDOM Performed at [facility], [address] [place], [place] [address]    Special Requests   Final    NONE Performed at [facility], [address] [place], [place] [address]    Culture >=100,000 COLONIES/mL VIRIDANS STREPTOCOCCUS (A)  Final   Report Status 03/01/2018 FINAL  Final    [x]  Treated with cipro, organism resistant to prescribed antimicrobial []  Patient discharged originally without antimicrobial agent and treatment is now indicated  New antibiotic prescription: DC ciprofloxacin, start cephalexin 500mg  PO BID x 7 days  ED Provider: Harlene SaltsBrandon Morelli, PA   Khadim Lundberg, Drake LeachRachel Lynn 03/02/2018, 9:26 AM Clinical Pharmacist Monday - Friday phone -  [phone] Saturday - Sunday phone - [phone]

## 2018-03-08 DIAGNOSIS — G4700 Insomnia, unspecified: Secondary | ICD-10-CM | POA: Diagnosis not present

## 2018-03-08 DIAGNOSIS — F0281 Dementia in other diseases classified elsewhere with behavioral disturbance: Secondary | ICD-10-CM | POA: Diagnosis not present

## 2018-03-08 DIAGNOSIS — S80219A Abrasion, unspecified knee, initial encounter: Secondary | ICD-10-CM | POA: Diagnosis not present

## 2018-03-08 DIAGNOSIS — K219 Gastro-esophageal reflux disease without esophagitis: Secondary | ICD-10-CM | POA: Diagnosis not present

## 2018-03-08 DIAGNOSIS — G8929 Other chronic pain: Secondary | ICD-10-CM | POA: Diagnosis not present

## 2018-03-08 DIAGNOSIS — E039 Hypothyroidism, unspecified: Secondary | ICD-10-CM | POA: Diagnosis not present

## 2018-03-08 DIAGNOSIS — R296 Repeated falls: Secondary | ICD-10-CM | POA: Diagnosis not present

## 2018-03-08 DIAGNOSIS — E559 Vitamin D deficiency, unspecified: Secondary | ICD-10-CM | POA: Diagnosis not present

## 2018-03-08 DIAGNOSIS — R05 Cough: Secondary | ICD-10-CM | POA: Diagnosis not present

## 2018-03-08 DIAGNOSIS — R197 Diarrhea, unspecified: Secondary | ICD-10-CM | POA: Diagnosis not present

## 2018-03-08 DIAGNOSIS — D631 Anemia in chronic kidney disease: Secondary | ICD-10-CM | POA: Diagnosis not present

## 2018-03-08 DIAGNOSIS — E785 Hyperlipidemia, unspecified: Secondary | ICD-10-CM | POA: Diagnosis not present

## 2018-03-14 DIAGNOSIS — G8929 Other chronic pain: Secondary | ICD-10-CM | POA: Diagnosis not present

## 2018-03-14 DIAGNOSIS — E785 Hyperlipidemia, unspecified: Secondary | ICD-10-CM | POA: Diagnosis not present

## 2018-03-14 DIAGNOSIS — F333 Major depressive disorder, recurrent, severe with psychotic symptoms: Secondary | ICD-10-CM | POA: Diagnosis not present

## 2018-03-14 DIAGNOSIS — D631 Anemia in chronic kidney disease: Secondary | ICD-10-CM | POA: Diagnosis not present

## 2018-03-14 DIAGNOSIS — E559 Vitamin D deficiency, unspecified: Secondary | ICD-10-CM | POA: Diagnosis not present

## 2018-03-14 DIAGNOSIS — E039 Hypothyroidism, unspecified: Secondary | ICD-10-CM | POA: Diagnosis not present

## 2018-03-14 DIAGNOSIS — G4700 Insomnia, unspecified: Secondary | ICD-10-CM | POA: Diagnosis not present

## 2018-03-14 DIAGNOSIS — K219 Gastro-esophageal reflux disease without esophagitis: Secondary | ICD-10-CM | POA: Diagnosis not present

## 2018-03-14 DIAGNOSIS — R05 Cough: Secondary | ICD-10-CM | POA: Diagnosis not present

## 2018-03-14 DIAGNOSIS — R197 Diarrhea, unspecified: Secondary | ICD-10-CM | POA: Diagnosis not present

## 2018-03-14 DIAGNOSIS — R296 Repeated falls: Secondary | ICD-10-CM | POA: Diagnosis not present

## 2018-03-14 DIAGNOSIS — F0281 Dementia in other diseases classified elsewhere with behavioral disturbance: Secondary | ICD-10-CM | POA: Diagnosis not present

## 2018-03-14 DIAGNOSIS — S80219A Abrasion, unspecified knee, initial encounter: Secondary | ICD-10-CM | POA: Diagnosis not present

## 2018-03-14 DIAGNOSIS — F0391 Unspecified dementia with behavioral disturbance: Secondary | ICD-10-CM | POA: Diagnosis not present

## 2018-03-21 DIAGNOSIS — W1839XA Other fall on same level, initial encounter: Secondary | ICD-10-CM | POA: Diagnosis not present

## 2018-03-21 DIAGNOSIS — M6281 Muscle weakness (generalized): Secondary | ICD-10-CM | POA: Diagnosis not present

## 2018-03-30 DIAGNOSIS — F419 Anxiety disorder, unspecified: Secondary | ICD-10-CM | POA: Diagnosis not present

## 2018-03-30 DIAGNOSIS — F333 Major depressive disorder, recurrent, severe with psychotic symptoms: Secondary | ICD-10-CM | POA: Diagnosis not present

## 2018-03-30 DIAGNOSIS — G4700 Insomnia, unspecified: Secondary | ICD-10-CM | POA: Diagnosis not present

## 2018-03-30 DIAGNOSIS — E039 Hypothyroidism, unspecified: Secondary | ICD-10-CM | POA: Diagnosis not present

## 2018-03-30 DIAGNOSIS — F0281 Dementia in other diseases classified elsewhere with behavioral disturbance: Secondary | ICD-10-CM | POA: Diagnosis not present

## 2018-03-30 DIAGNOSIS — F064 Anxiety disorder due to known physiological condition: Secondary | ICD-10-CM | POA: Diagnosis not present

## 2018-03-30 DIAGNOSIS — E559 Vitamin D deficiency, unspecified: Secondary | ICD-10-CM | POA: Diagnosis not present

## 2018-03-30 DIAGNOSIS — G3184 Mild cognitive impairment, so stated: Secondary | ICD-10-CM | POA: Diagnosis not present

## 2018-03-30 DIAGNOSIS — F5105 Insomnia due to other mental disorder: Secondary | ICD-10-CM | POA: Diagnosis not present

## 2018-03-30 DIAGNOSIS — D631 Anemia in chronic kidney disease: Secondary | ICD-10-CM | POA: Diagnosis not present

## 2018-03-30 DIAGNOSIS — F39 Unspecified mood [affective] disorder: Secondary | ICD-10-CM | POA: Diagnosis not present

## 2018-03-30 DIAGNOSIS — E785 Hyperlipidemia, unspecified: Secondary | ICD-10-CM | POA: Diagnosis not present

## 2018-04-09 ENCOUNTER — Other Ambulatory Visit: Payer: Self-pay

## 2018-04-09 ENCOUNTER — Encounter: Payer: Self-pay | Admitting: Neurology

## 2018-04-09 ENCOUNTER — Ambulatory Visit (INDEPENDENT_AMBULATORY_CARE_PROVIDER_SITE_OTHER): Payer: Medicare Other | Admitting: Neurology

## 2018-04-09 VITALS — BP 184/86 | HR 93 | Ht 60.0 in | Wt 115.0 lb

## 2018-04-09 DIAGNOSIS — F0390 Unspecified dementia without behavioral disturbance: Secondary | ICD-10-CM | POA: Diagnosis not present

## 2018-04-09 DIAGNOSIS — F03C0 Unspecified dementia, severe, without behavioral disturbance, psychotic disturbance, mood disturbance, and anxiety: Secondary | ICD-10-CM

## 2018-04-09 DIAGNOSIS — F0281 Dementia in other diseases classified elsewhere with behavioral disturbance: Secondary | ICD-10-CM | POA: Diagnosis not present

## 2018-04-09 DIAGNOSIS — F333 Major depressive disorder, recurrent, severe with psychotic symptoms: Secondary | ICD-10-CM | POA: Diagnosis not present

## 2018-04-09 DIAGNOSIS — F064 Anxiety disorder due to known physiological condition: Secondary | ICD-10-CM | POA: Diagnosis not present

## 2018-04-09 DIAGNOSIS — G301 Alzheimer's disease with late onset: Secondary | ICD-10-CM | POA: Diagnosis not present

## 2018-04-09 DIAGNOSIS — F5105 Insomnia due to other mental disorder: Secondary | ICD-10-CM | POA: Diagnosis not present

## 2018-04-09 NOTE — Progress Notes (Signed)
NEUROLOGY FOLLOW UP OFFICE NOTE  Diamond [name] [id number] 05-18-1927  HISTORY OF PRESENT ILLNESS: I had the pleasure of seeing Diamond [name] in follow-up in the neurology clinic on 04/09/2018.  The patient was last seen 6 months ago for mild dementia and is accompanied by her 2 daughters who help supplement the history today. MMSE 19/30 in March 2019 (MOCA score 16/30 in September 2018). Since her last visit, she has had progressive decline with significant changes since her last visit. She is having more difficulty communicating and getting her words out. She is more confused, unable to use a phone now. She knows her daughters but does not call them by their names. She relates with stuffed animals and toys. She was having an increase in falls, and moved to Memory Care last 03/17/18. Donepezil and oxycodone have been discontinued. Her daughters feel that with discontinuation of these medications, she is a lot more calm, rested, and happier. She has a routine and does activities throughout the day. She has more supervision getting out of bed and has not had any significant falls since the end of August. Family has noticed more restlessness, with occasional tremor on her right hand, she keeps touching different things. She needs help with dressing and bathing. She is now wheelchair-bound, she stopped using her walker last May. She denies any headaches, dizziness, vision changes, focal numbness/tingling/weakness, neck/back pain. Her daughters have not heard of any hallucinations. She is on Depakote 125mg  BID without side effects. Sleep is good.   HPI 03/27/2017: This is a pleasant 84 yo RH woman with a history of hypertension, hyperlipidemia, hypothyroidism, gout, with worsening memory. She thinks her memory is pretty good. Her daughter started noticing memory changes since around January to March 2018, she would forget little things and was moving a little slower. Short-term memory was mostly affected,  her long-term memory is excellent. She has trouble with dates, names, phone numbers. Her husband of 47 years was moved to a Memory Care facility at [facility] last July, then she moved to assisted living last month. She does not like it there, stating she cannot get information about her husband, "nobody knows anything." She reports that since she has been there, she has not had a discussion with any doctor about her husband, "they are disorganized." When she was living at home, her daughter feels she was not taking her medications as prescribed, finding full bottles of medications such as the Donepezil at home. Medications are now being administered by staff at [facility]. She denies any side effects on Aricept 10mg  daily. Her daughter reports that the house had been clean and laundry was always done, but reports concern that her hygiene is not good. She refuses help from staff with dressing and bathing. She fusses at her husband and has become more agitated and irritable in the past 6-8 weeks. Her daughter is concerned about her negativity. They are also reporting visual hallucinations, she sees her husband in the room with her, even before moving to [facility], while he was at rehab, she reported seeing him in the room. She is upset that someone was in her room at [facility] watching TV, they woke her up and cut her TV on. She asked who they were and they gave her a name, then she fell back to sleep. She states staff knocked on her door the next morning and gave her her key, but she says it is always on her neck. Last night she dreamed her husband and daughter  were in the room with her. Her daughter started taking care of bills after the patient's husband moved to Memory Care. She stopped driving 3-5 years ago. She denies leaving the stove on, her daughter reports one time the crockpot was left on. She states she still plans to go back home and that she had help at home.   She denies any headaches, dizziness,  diplopia, dysarthria, dysphagia, neck pain, focal numbness/tingling/weakness, bowel/bladder dysfunction. No anosmia, tremors, no recent falls. She denies any significant head injuries. No family history of dementia. She was in the ER after a fall in 06/2014 and had a head CT which I personally reviewed, no acute changes, there was diffuse atrophy and chronic microvascular disease.   PAST MEDICAL HISTORY: Past Medical History:  Diagnosis Date  . Arthritis    "everywhere"  . Dementia   . High cholesterol   . History of blood transfusion    "related to my hip OR"  . History of gout   . Hypertension   . Hypothyroidism   . Migraine    "q 5-6 years" (06/06/2014)    MEDICATIONS: Current Outpatient Medications on File Prior to Visit  Medication Sig Dispense Refill  . acetaminophen (TYLENOL) 650 MG CR tablet Take 650 mg by mouth every 8 (eight) hours as needed for pain.    Marland Kitchen aspirin EC 81 MG tablet Take 1 tablet (81 mg total) by mouth daily. 30 tablet 0  . calcium-vitamin D (OSCAL WITH D) 500-200 MG-UNIT tablet Take 1 tablet by mouth 2 (two) times daily.    . Cholecalciferol (VITAMIN D) 2000 units CAPS Take 2,000 Units by mouth daily.    . ciprofloxacin (CIPRO) 500 MG tablet Take 1 tablet (500 mg total) by mouth 2 (two) times daily. 14 tablet 0  . citalopram (CELEXA) 20 MG tablet Take 20 mg by mouth daily.    . diphenhydramine-acetaminophen (TYLENOL PM) 25-500 MG TABS tablet Take 1 tablet by mouth at bedtime as needed.    . divalproex (DEPAKOTE SPRINKLE) 125 MG capsule Take 125 mg by mouth 2 (two) times daily.    . divalproex (DEPAKOTE) 125 MG DR tablet Take 125 mg by mouth 3 (three) times daily.    Marland Kitchen donepezil (ARICEPT) 10 MG tablet Take 10 mg by mouth at bedtime.    Marland Kitchen guaiFENesin (ROBITUSSIN) 100 MG/5ML liquid Take 200 mg by mouth every 6 (six) hours as needed for cough.    . hydrocortisone 2.5 % cream Apply 1 application topically 2 (two) times daily.    . iron polysaccharides (NIFEREX)  150 MG capsule Take 150 mg by mouth daily.    Marland Kitchen levothyroxine (SYNTHROID, LEVOTHROID) 50 MCG tablet Take 50 mcg by mouth daily before breakfast.    . loperamide (IMODIUM A-D) 2 MG tablet Take 2 mg by mouth 4 (four) times daily as needed for diarrhea or loose stools.    . meloxicam (MOBIC) 7.5 MG tablet Take 7.5 mg by mouth every 12 (twelve) hours as needed for pain.    . Multiple Vitamin (MULTIVITAMIN WITH MINERALS) TABS tablet Take 1 tablet by mouth daily.    Marland Kitchen oxycodone-acetaminophen (PERCOCET) 2.5-325 MG tablet Take 1 tablet by mouth every 12 (twelve) hours as needed for pain.    . ranitidine (ZANTAC) 75 MG tablet Take 75 mg by mouth daily.    . simvastatin (ZOCOR) 40 MG tablet Take 40 mg by mouth at bedtime.      No current facility-administered medications on file prior to visit.  ALLERGIES: Allergies  Allergen Reactions  . Codeine Swelling    Lips  . Aripiprazole Other (See Comments)    Mental issues  . Penicillins Swelling    Lips swelled with PCN; tolerating Cephalosporins 05/2014\ Has patient had a PCN reaction causing immediate rash, facial/tongue/throat swelling, SOB or lightheadedness with hypotension: No Has patient had a PCN reaction causing severe rash involving mucus membranes or skin necrosis: No Has patient had a PCN reaction that required hospitalization: No Has patient had a PCN reaction occurring within the last 10 years: No If all of the above answers are "NO", then may proceed with Cephalosporin use.  . Prednisone Other (See Comments)    Mental issues  . Tramadol Other (See Comments)    Pt does not know    FAMILY HISTORY: Family History  Problem Relation Age of Onset  . Heart attack Father     SOCIAL HISTORY: Social History   Socioeconomic History  . Marital status: Married    Spouse name: Not on file  . Number of children: Not on file  . Years of education: Not on file  . Highest education level: Not on file  Occupational History  . Not on  file  Social Needs  . Financial resource strain: Not on file  . Food insecurity:    Worry: Not on file    Inability: Not on file  . Transportation needs:    Medical: Not on file    Non-medical: Not on file  Tobacco Use  . Smoking status: Former Smoker    Packs/day: 2.00    Years: 40.00    Pack years: 80.00    Types: Cigarettes    Last attempt to quit: 05/05/1987    Years since quitting: 30.9  . Smokeless tobacco: Never Used  Substance and Sexual Activity  . Alcohol use: Yes    Alcohol/week: 7.0 standard drinks    Types: 7 Glasses of wine per week  . Drug use: No  . Sexual activity: Not Currently  Lifestyle  . Physical activity:    Days per week: Not on file    Minutes per session: Not on file  . Stress: Not on file  Relationships  . Social connections:    Talks on phone: Not on file    Gets together: Not on file    Attends religious service: Not on file    Active member of club or organization: Not on file    Attends meetings of clubs or organizations: Not on file    Relationship status: Not on file  . Intimate partner violence:    Fear of current or ex partner: Not on file    Emotionally abused: Not on file    Physically abused: Not on file    Forced sexual activity: Not on file  Other Topics Concern  . Not on file  Social History Narrative  . Not on file    REVIEW OF SYSTEMS: Constitutional: No fevers, chills, or sweats, no generalized fatigue, change in appetite Eyes: No visual changes, double vision, eye pain Ear, nose and throat: No hearing loss, ear pain, nasal congestion, sore throat Cardiovascular: No chest pain, palpitations Respiratory:  No shortness of breath at rest or with exertion, wheezes GastrointestinaI: No nausea, vomiting, diarrhea, abdominal pain, fecal incontinence Genitourinary:  No dysuria, urinary retention or frequency Musculoskeletal:  No neck pain, back pain Integumentary: No rash, pruritus, skin lesions Neurological: as  above Psychiatric: No depression, insomnia, anxiety Endocrine: No palpitations, fatigue, diaphoresis, mood swings,  change in appetite, change in weight, increased thirst Hematologic/Lymphatic:  No anemia, purpura, petechiae. Allergic/Immunologic: no itchy/runny eyes, nasal congestion, recent allergic reactions, rashes  PHYSICAL EXAM: Vitals:   04/09/18 1614  BP: (!) 184/86  Pulse: 93  SpO2: 99%   General: No acute distress, sitting in wheelchair Head:  Normocephalic/atraumatic Neck: supple, no paraspinal tenderness, full range of motion Heart:  Regular rate and rhythm Lungs:  Clear to auscultation bilaterally Back: No paraspinal tenderness Skin/Extremities: No rash, no edema Neurological Exam: alert and oriented to person, states it is the 28ths, 1987, Christmas season, she is in [place]. She has word-finding difficulties, no dysarthria. Fund of knowledge is reduced.  Recent and remote memory are impaired.  Attention and concentration are reduced.    Able to name objects and repeat phrases. CDT 0/5 MMSE - Mini Mental State Exam 04/09/2018 09/26/2017  Orientation to time 1 1  Orientation to Place 2 5  Registration 3 3  Attention/ Calculation 0 2  Recall 0 0  Language- name 2 objects 2 2  Language- repeat 1 1  Language- follow 3 step command 1 3  Language- read & follow direction 0 1  Write a sentence 0 1  Copy design 0 0  Total score 10 19   Cranial nerves: Pupils equal, round, reactive to light.  Extraocular movements intact with no nystagmus. Visual fields full. Facial sensation intact. No facial asymmetry. Tongue, uvula, palate midline.  Motor: cogwheeling bilaterally. Muscle strength 5/5 throughout with no pronator drift.  Sensation to light touch intact.  No extinction to double simultaneous stimulation.  Deep tendon reflexes +1 throughout, toes downgoing.  Finger to nose testing intact.  Gait not tested, she is now wheelchairbound, could not stand without 2-person assist. No  tremor on exam today.   IMPRESSION: This is an 84 yo RH woman with a history of hypertension, hyperlipidemia, hypothyroidism, gout, with progressive dementia. There has been a significant decline since her last visit, she has more word-finding difficulties and confusion today, MMSE 10/30 (19/30 in March 2019). Her family has decided to stop Donepezil and feels she is much calmer. She is on Depakote 125mg  BID for mood stabilization, no hallucinations reported. She is now in Memory Care. We discussed goals of care, quality of life, which her family is satisfied with currently. She will follow-up on a prn basis and knows to call our office for any changes.   Thank you for allowing me to participate in her care.  Please do not hesitate to call for any questions or concerns.  The duration of this appointment visit was 27 minutes of face-to-face time with the patient.  Greater than 50% of this time was spent in counseling, explanation of diagnosis, planning of further management, and coordination of care.   Patrcia Dolly, M.D.

## 2018-04-09 NOTE — Patient Instructions (Signed)
1. Continue all your current medications 2. Follow-up in 6 months, call for any changes  FALL PRECAUTIONS: Be cautious when walking. Scan the area for obstacles that may increase the risk of trips and falls. When getting up in the mornings, sit up at the edge of the bed for a few minutes before getting out of bed. Consider elevating the bed at the head end to avoid drop of blood pressure when getting up. Walk always in a well-lit room (use night lights in the walls). Avoid area rugs or power cords from appliances in the middle of the walkways. Use a walker or a cane if necessary and consider physical therapy for balance exercise. Get your eyesight checked regularly.  ABILITY TO BE LEFT ALONE: If patient is unable to contact 911 operator, consider using LifeLine, or when the need is there, arrange for someone to stay with patients. Smoking is a fire hazard, consider supervision or cessation. Risk of wandering should be assessed by caregiver and if detected at any point, supervision and safe proof recommendations should be instituted.  RECOMMENDATIONS FOR ALL PATIENTS WITH MEMORY PROBLEMS: 1. Continue to exercise (Recommend 30 minutes of walking everyday, or 3 hours every week) 2. Increase social interactions - continue going to Church and enjoy social gatherings with friends and family 3. Eat healthy, avoid fried foods and eat more fruits and vegetables 4. Maintain adequate blood pressure, blood sugar, and blood cholesterol level. Reducing the risk of stroke and cardiovascular disease also helps promoting better memory. 5. Avoid stressful situations. Live a simple life and avoid aggravations. Organize your time and prepare for the next day in anticipation. 6. Sleep well, avoid any interruptions of sleep and avoid any distractions in the bedroom that may interfere with adequate sleep quality 7. Avoid sugar, avoid sweets as there is a strong link between excessive sugar intake, diabetes, and cognitive  impairment The Mediterranean diet has been shown to help patients reduce the risk of progressive memory disorders and reduces cardiovascular risk. This includes eating fish, eat fruits and green leafy vegetables, nuts like almonds and hazelnuts, walnuts, and also use olive oil. Avoid fast foods and fried foods as much as possible. Avoid sweets and sugar as sugar use has been linked to worsening of memory function.  There is always a concern of gradual progression of memory problems. If this is the case, then we may need to adjust level of care according to patient needs. Support, both to the patient and caregiver, should then be put into place.  

## 2018-04-27 DIAGNOSIS — R300 Dysuria: Secondary | ICD-10-CM | POA: Diagnosis not present

## 2018-05-07 DIAGNOSIS — F333 Major depressive disorder, recurrent, severe with psychotic symptoms: Secondary | ICD-10-CM | POA: Diagnosis not present

## 2018-05-07 DIAGNOSIS — F0281 Dementia in other diseases classified elsewhere with behavioral disturbance: Secondary | ICD-10-CM | POA: Diagnosis not present

## 2018-05-07 DIAGNOSIS — G301 Alzheimer's disease with late onset: Secondary | ICD-10-CM | POA: Diagnosis not present

## 2018-05-07 DIAGNOSIS — F5105 Insomnia due to other mental disorder: Secondary | ICD-10-CM | POA: Diagnosis not present

## 2018-05-07 DIAGNOSIS — F064 Anxiety disorder due to known physiological condition: Secondary | ICD-10-CM | POA: Diagnosis not present

## 2018-05-23 DIAGNOSIS — E785 Hyperlipidemia, unspecified: Secondary | ICD-10-CM | POA: Diagnosis not present

## 2018-05-23 DIAGNOSIS — R05 Cough: Secondary | ICD-10-CM | POA: Diagnosis not present

## 2018-05-23 DIAGNOSIS — F0281 Dementia in other diseases classified elsewhere with behavioral disturbance: Secondary | ICD-10-CM | POA: Diagnosis not present

## 2018-05-23 DIAGNOSIS — K219 Gastro-esophageal reflux disease without esophagitis: Secondary | ICD-10-CM | POA: Diagnosis not present

## 2018-05-23 DIAGNOSIS — G4700 Insomnia, unspecified: Secondary | ICD-10-CM | POA: Diagnosis not present

## 2018-05-23 DIAGNOSIS — R197 Diarrhea, unspecified: Secondary | ICD-10-CM | POA: Diagnosis not present

## 2018-05-23 DIAGNOSIS — E039 Hypothyroidism, unspecified: Secondary | ICD-10-CM | POA: Diagnosis not present

## 2018-05-23 DIAGNOSIS — G8929 Other chronic pain: Secondary | ICD-10-CM | POA: Diagnosis not present

## 2018-05-23 DIAGNOSIS — R296 Repeated falls: Secondary | ICD-10-CM | POA: Diagnosis not present

## 2018-05-23 DIAGNOSIS — D631 Anemia in chronic kidney disease: Secondary | ICD-10-CM | POA: Diagnosis not present

## 2018-05-23 DIAGNOSIS — E559 Vitamin D deficiency, unspecified: Secondary | ICD-10-CM | POA: Diagnosis not present

## 2018-05-23 DIAGNOSIS — S80219A Abrasion, unspecified knee, initial encounter: Secondary | ICD-10-CM | POA: Diagnosis not present

## 2018-05-30 DIAGNOSIS — E782 Mixed hyperlipidemia: Secondary | ICD-10-CM | POA: Diagnosis not present

## 2018-05-30 DIAGNOSIS — I10 Essential (primary) hypertension: Secondary | ICD-10-CM | POA: Diagnosis not present

## 2018-05-30 DIAGNOSIS — E039 Hypothyroidism, unspecified: Secondary | ICD-10-CM | POA: Diagnosis not present

## 2018-06-04 DIAGNOSIS — F333 Major depressive disorder, recurrent, severe with psychotic symptoms: Secondary | ICD-10-CM | POA: Diagnosis not present

## 2018-06-04 DIAGNOSIS — F0281 Dementia in other diseases classified elsewhere with behavioral disturbance: Secondary | ICD-10-CM | POA: Diagnosis not present

## 2018-06-04 DIAGNOSIS — F064 Anxiety disorder due to known physiological condition: Secondary | ICD-10-CM | POA: Diagnosis not present

## 2018-06-04 DIAGNOSIS — G301 Alzheimer's disease with late onset: Secondary | ICD-10-CM | POA: Diagnosis not present

## 2018-06-04 DIAGNOSIS — F5105 Insomnia due to other mental disorder: Secondary | ICD-10-CM | POA: Diagnosis not present

## 2018-06-18 DIAGNOSIS — F333 Major depressive disorder, recurrent, severe with psychotic symptoms: Secondary | ICD-10-CM | POA: Diagnosis not present

## 2018-06-18 DIAGNOSIS — F064 Anxiety disorder due to known physiological condition: Secondary | ICD-10-CM | POA: Diagnosis not present

## 2018-06-18 DIAGNOSIS — F0281 Dementia in other diseases classified elsewhere with behavioral disturbance: Secondary | ICD-10-CM | POA: Diagnosis not present

## 2018-06-18 DIAGNOSIS — F5105 Insomnia due to other mental disorder: Secondary | ICD-10-CM | POA: Diagnosis not present

## 2018-06-18 DIAGNOSIS — G301 Alzheimer's disease with late onset: Secondary | ICD-10-CM | POA: Diagnosis not present

## 2018-06-20 DIAGNOSIS — I2510 Atherosclerotic heart disease of native coronary artery without angina pectoris: Secondary | ICD-10-CM | POA: Diagnosis not present

## 2018-06-20 DIAGNOSIS — E559 Vitamin D deficiency, unspecified: Secondary | ICD-10-CM | POA: Diagnosis not present

## 2018-06-20 DIAGNOSIS — F0281 Dementia in other diseases classified elsewhere with behavioral disturbance: Secondary | ICD-10-CM | POA: Diagnosis not present

## 2018-06-20 DIAGNOSIS — E039 Hypothyroidism, unspecified: Secondary | ICD-10-CM | POA: Diagnosis not present

## 2018-06-20 DIAGNOSIS — G4700 Insomnia, unspecified: Secondary | ICD-10-CM | POA: Diagnosis not present

## 2018-06-20 DIAGNOSIS — S80219A Abrasion, unspecified knee, initial encounter: Secondary | ICD-10-CM | POA: Diagnosis not present

## 2018-06-20 DIAGNOSIS — G8929 Other chronic pain: Secondary | ICD-10-CM | POA: Diagnosis not present

## 2018-06-20 DIAGNOSIS — E785 Hyperlipidemia, unspecified: Secondary | ICD-10-CM | POA: Diagnosis not present

## 2018-06-20 DIAGNOSIS — K219 Gastro-esophageal reflux disease without esophagitis: Secondary | ICD-10-CM | POA: Diagnosis not present

## 2018-06-20 DIAGNOSIS — D631 Anemia in chronic kidney disease: Secondary | ICD-10-CM | POA: Diagnosis not present

## 2018-06-20 DIAGNOSIS — R296 Repeated falls: Secondary | ICD-10-CM | POA: Diagnosis not present

## 2018-06-20 DIAGNOSIS — R05 Cough: Secondary | ICD-10-CM | POA: Diagnosis not present

## 2018-06-21 DIAGNOSIS — F0281 Dementia in other diseases classified elsewhere with behavioral disturbance: Secondary | ICD-10-CM | POA: Diagnosis not present

## 2018-07-02 DIAGNOSIS — F0281 Dementia in other diseases classified elsewhere with behavioral disturbance: Secondary | ICD-10-CM | POA: Diagnosis not present

## 2018-07-02 DIAGNOSIS — F064 Anxiety disorder due to known physiological condition: Secondary | ICD-10-CM | POA: Diagnosis not present

## 2018-07-02 DIAGNOSIS — F5105 Insomnia due to other mental disorder: Secondary | ICD-10-CM | POA: Diagnosis not present

## 2018-07-02 DIAGNOSIS — F333 Major depressive disorder, recurrent, severe with psychotic symptoms: Secondary | ICD-10-CM | POA: Diagnosis not present

## 2018-07-02 DIAGNOSIS — G301 Alzheimer's disease with late onset: Secondary | ICD-10-CM | POA: Diagnosis not present

## 2019-06-14 IMAGING — CR DG CHEST 2V
2 series · 2 of 2 positions shown · non-contrast
Comparison: PA and lateral chest x-ray June 03, 2014

CLINICAL DATA: Medication change 5 days ago with altered mental
status since. Increased weakness over the past several days.

EXAM:
CHEST  2 VIEW

[w chest lat]
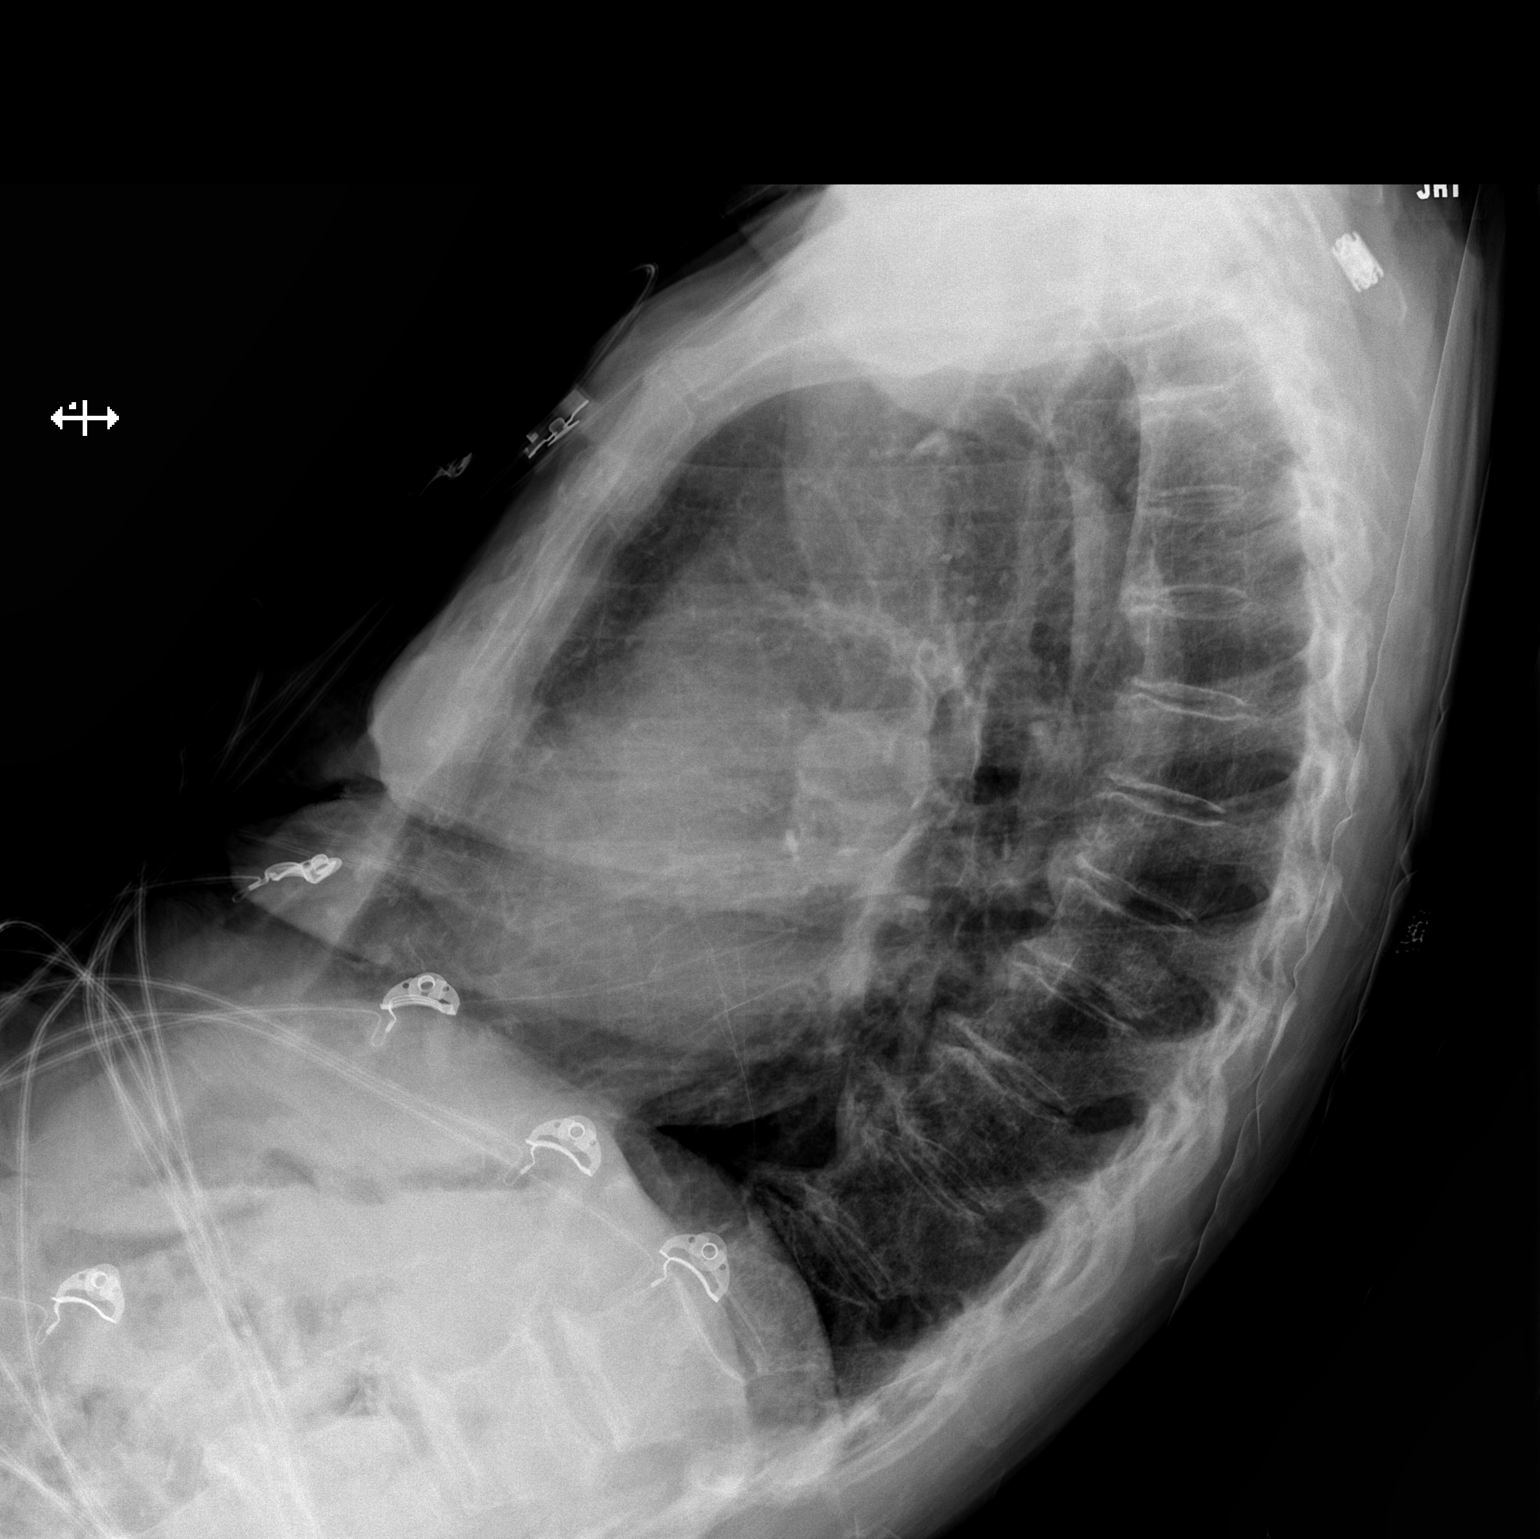

[x chest ap]
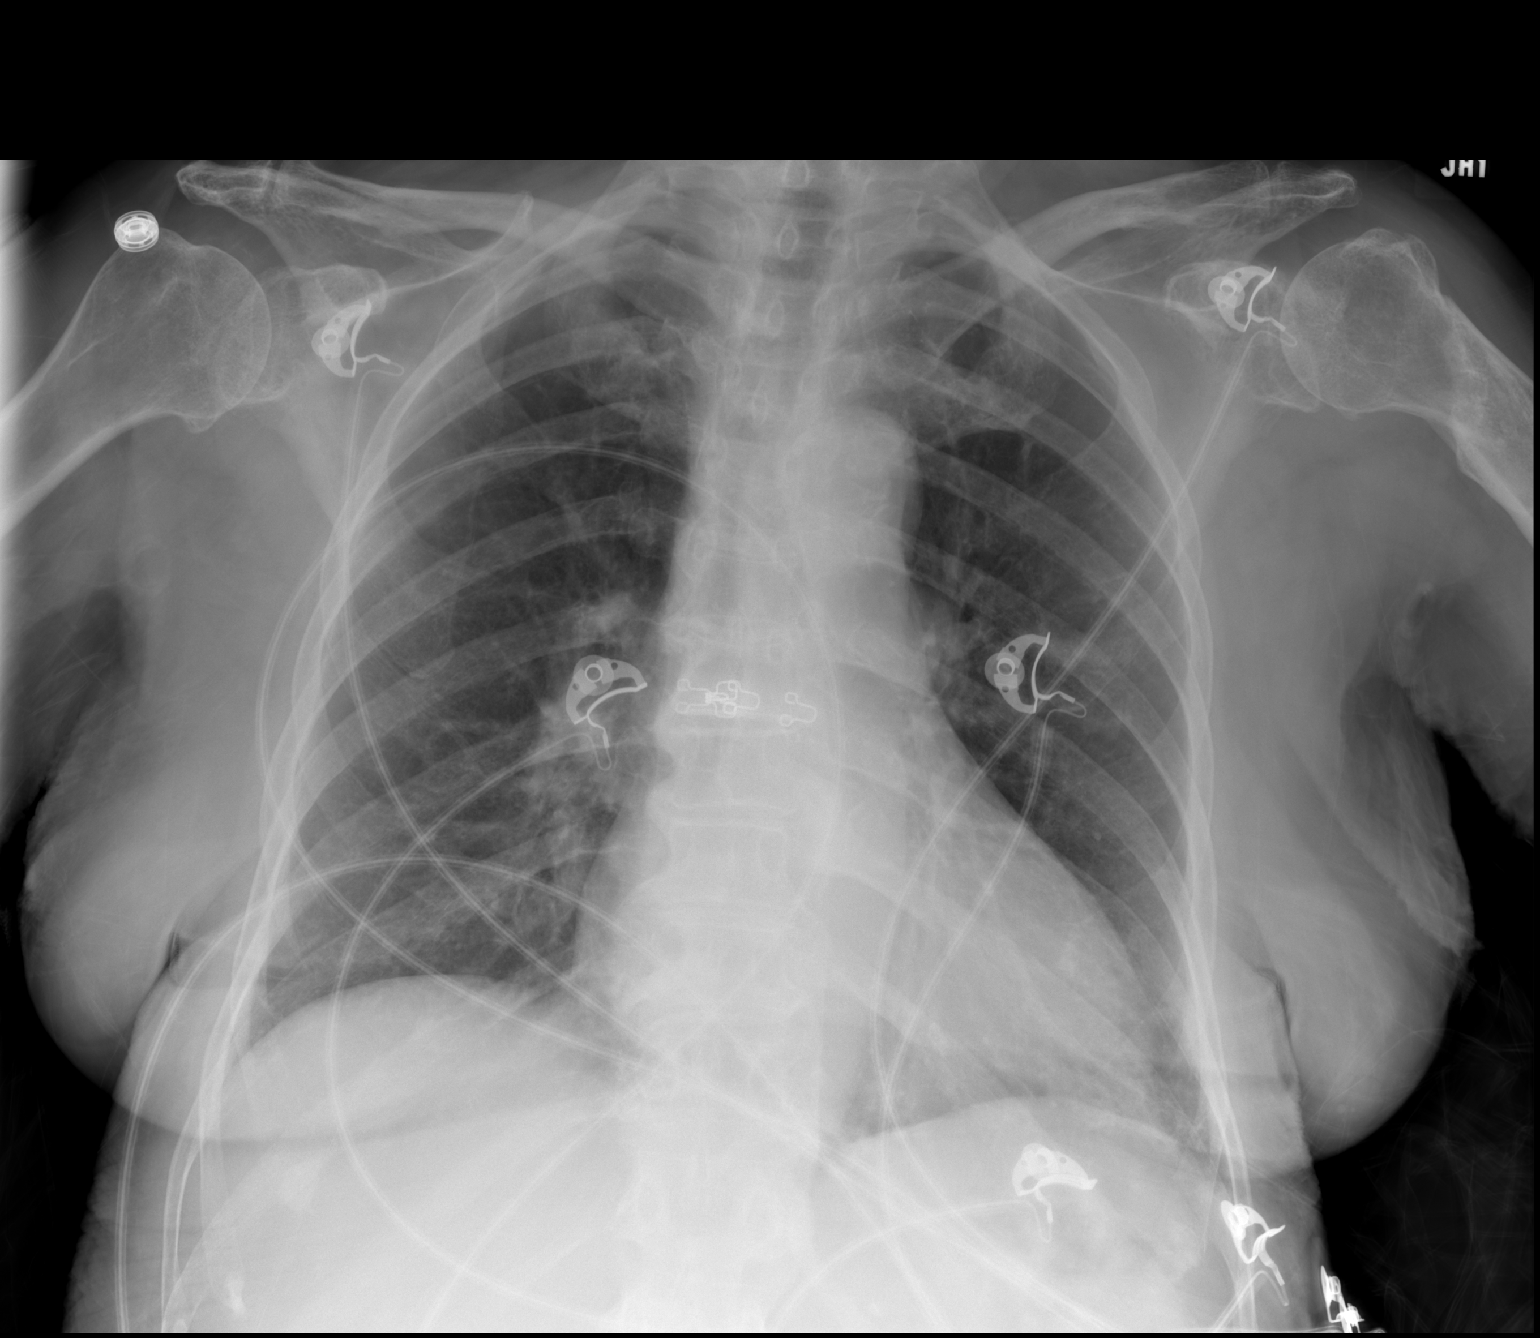

[2 of 2 positions shown; findings below may reference images not displayed]

FINDINGS: The lungs are well-expanded. There is no focal infiltrate. There is
no pleural effusion. The heart and pulmonary vascularity are normal.
The mediastinum is normal in width. The trachea is midline. There
calcification in the wall of the aortic arch. There is no pleural
effusion. There degenerative changes of both shoulders and an old
fracture of the proximal left humeral shaft.
IMPRESSION: There is no active cardiopulmonary disease.

Thoracic aortic atherosclerosis.

## 2019-07-19 ENCOUNTER — Encounter (HOSPITAL_COMMUNITY): Payer: Self-pay | Admitting: Pharmacy Technician

## 2019-07-19 ENCOUNTER — Inpatient Hospital Stay (HOSPITAL_COMMUNITY)
Admission: EM | Admit: 2019-07-19 | Discharge: 2019-07-22 | DRG: 640 | Disposition: A | Discharge status: Hospice | Payer: Medicare Other | Attending: Internal Medicine | Admitting: Internal Medicine

## 2019-07-19 ENCOUNTER — Emergency Department (HOSPITAL_COMMUNITY): Payer: Medicare Other

## 2019-07-19 DIAGNOSIS — G9340 Encephalopathy, unspecified: Secondary | ICD-10-CM | POA: Diagnosis present

## 2019-07-19 DIAGNOSIS — Z66 Do not resuscitate: Secondary | ICD-10-CM | POA: Diagnosis present

## 2019-07-19 DIAGNOSIS — N390 Urinary tract infection, site not specified: Secondary | ICD-10-CM | POA: Diagnosis present

## 2019-07-19 DIAGNOSIS — N179 Acute kidney failure, unspecified: Secondary | ICD-10-CM | POA: Diagnosis present

## 2019-07-19 DIAGNOSIS — Z87891 Personal history of nicotine dependence: Secondary | ICD-10-CM

## 2019-07-19 DIAGNOSIS — Z8249 Family history of ischemic heart disease and other diseases of the circulatory system: Secondary | ICD-10-CM | POA: Diagnosis not present

## 2019-07-19 DIAGNOSIS — Z515 Encounter for palliative care: Secondary | ICD-10-CM | POA: Diagnosis present

## 2019-07-19 DIAGNOSIS — Z993 Dependence on wheelchair: Secondary | ICD-10-CM

## 2019-07-19 DIAGNOSIS — R630 Anorexia: Secondary | ICD-10-CM | POA: Diagnosis present

## 2019-07-19 DIAGNOSIS — G9341 Metabolic encephalopathy: Secondary | ICD-10-CM | POA: Diagnosis present

## 2019-07-19 DIAGNOSIS — I10 Essential (primary) hypertension: Secondary | ICD-10-CM | POA: Diagnosis present

## 2019-07-19 DIAGNOSIS — E871 Hypo-osmolality and hyponatremia: Secondary | ICD-10-CM | POA: Diagnosis present

## 2019-07-19 DIAGNOSIS — R680 Hypothermia, not associated with low environmental temperature: Secondary | ICD-10-CM | POA: Diagnosis present

## 2019-07-19 DIAGNOSIS — Z7982 Long term (current) use of aspirin: Secondary | ICD-10-CM

## 2019-07-19 DIAGNOSIS — E7800 Pure hypercholesterolemia, unspecified: Secondary | ICD-10-CM | POA: Diagnosis present

## 2019-07-19 DIAGNOSIS — R7989 Other specified abnormal findings of blood chemistry: Secondary | ICD-10-CM | POA: Diagnosis present

## 2019-07-19 DIAGNOSIS — G43909 Migraine, unspecified, not intractable, without status migrainosus: Secondary | ICD-10-CM | POA: Diagnosis present

## 2019-07-19 DIAGNOSIS — Z885 Allergy status to narcotic agent status: Secondary | ICD-10-CM

## 2019-07-19 DIAGNOSIS — Z96641 Presence of right artificial hip joint: Secondary | ICD-10-CM | POA: Diagnosis present

## 2019-07-19 DIAGNOSIS — E870 Hyperosmolality and hypernatremia: Secondary | ICD-10-CM | POA: Diagnosis present

## 2019-07-19 DIAGNOSIS — Z79890 Hormone replacement therapy (postmenopausal): Secondary | ICD-10-CM

## 2019-07-19 DIAGNOSIS — R404 Transient alteration of awareness: Secondary | ICD-10-CM | POA: Diagnosis not present

## 2019-07-19 DIAGNOSIS — M109 Gout, unspecified: Secondary | ICD-10-CM | POA: Diagnosis present

## 2019-07-19 DIAGNOSIS — Z20822 Contact with and (suspected) exposure to covid-19: Secondary | ICD-10-CM | POA: Diagnosis present

## 2019-07-19 DIAGNOSIS — Z9049 Acquired absence of other specified parts of digestive tract: Secondary | ICD-10-CM | POA: Diagnosis not present

## 2019-07-19 DIAGNOSIS — F0280 Dementia in other diseases classified elsewhere without behavioral disturbance: Secondary | ICD-10-CM | POA: Diagnosis present

## 2019-07-19 DIAGNOSIS — Z880 Allergy status to penicillin: Secondary | ICD-10-CM

## 2019-07-19 DIAGNOSIS — Z888 Allergy status to other drugs, medicaments and biological substances status: Secondary | ICD-10-CM

## 2019-07-19 DIAGNOSIS — Z8616 Personal history of COVID-19: Secondary | ICD-10-CM | POA: Diagnosis not present

## 2019-07-19 DIAGNOSIS — E878 Other disorders of electrolyte and fluid balance, not elsewhere classified: Secondary | ICD-10-CM | POA: Diagnosis present

## 2019-07-19 DIAGNOSIS — Z79891 Long term (current) use of opiate analgesic: Secondary | ICD-10-CM

## 2019-07-19 DIAGNOSIS — E039 Hypothyroidism, unspecified: Secondary | ICD-10-CM | POA: Diagnosis present

## 2019-07-19 DIAGNOSIS — G301 Alzheimer's disease with late onset: Secondary | ICD-10-CM | POA: Diagnosis present

## 2019-07-19 DIAGNOSIS — Z79899 Other long term (current) drug therapy: Secondary | ICD-10-CM

## 2019-07-19 DIAGNOSIS — Z6821 Body mass index (BMI) 21.0-21.9, adult: Secondary | ICD-10-CM

## 2019-07-19 DIAGNOSIS — G309 Alzheimer's disease, unspecified: Secondary | ICD-10-CM | POA: Diagnosis not present

## 2019-07-19 LAB — LACTIC ACID, PLASMA
Lactic Acid, Venous: 3.9 mmol/L (ref 0.5–1.9)
Lactic Acid, Venous: 2.2 mmol/L (ref 0.5–1.9)

## 2019-07-19 LAB — CBC WITH DIFFERENTIAL/PLATELET
Abs Immature Granulocytes: 0.13 10*3/uL — ABNORMAL HIGH (ref 0.00–0.07)
Basophils Absolute: 0.1 10*3/uL (ref 0.0–0.1)
Basophils Relative: 1 %
Eosinophils Absolute: 0 10*3/uL (ref 0.0–0.5)
Eosinophils Relative: 0 %
HCT: 46.8 % — ABNORMAL HIGH (ref 36.0–46.0)
Hemoglobin: 13.9 g/dL (ref 12.0–15.0)
Immature Granulocytes: 1 %
Lymphocytes Relative: 16 %
Lymphs Abs: 1.8 10*3/uL (ref 0.7–4.0)
MCH: 30.2 pg (ref 26.0–34.0)
MCHC: 29.7 g/dL — ABNORMAL LOW (ref 30.0–36.0)
MCV: 101.7 fL — ABNORMAL HIGH (ref 80.0–100.0)
Monocytes Absolute: 0.9 10*3/uL (ref 0.1–1.0)
Monocytes Relative: 8 %
Neutro Abs: 8.4 10*3/uL — ABNORMAL HIGH (ref 1.7–7.7)
Neutrophils Relative %: 74 %
Platelets: 664 10*3/uL — ABNORMAL HIGH (ref 150–400)
RBC: 4.6 MIL/uL (ref 3.87–5.11)
RDW: 13.8 % (ref 11.5–15.5)
WBC: 11.2 10*3/uL — ABNORMAL HIGH (ref 4.0–10.5)
nRBC: 0 % (ref 0.0–0.2)

## 2019-07-19 LAB — CBG MONITORING, ED: Glucose-Capillary: 137 mg/dL — ABNORMAL HIGH (ref 70–99)

## 2019-07-19 LAB — URINALYSIS, ROUTINE W REFLEX MICROSCOPIC
Bilirubin Urine: NEGATIVE
Glucose, UA: NEGATIVE mg/dL
Hgb urine dipstick: NEGATIVE
Ketones, ur: NEGATIVE mg/dL
Nitrite: NEGATIVE
Protein, ur: 30 mg/dL — AB
Specific Gravity, Urine: 1.014 (ref 1.005–1.030)
pH: 7 (ref 5.0–8.0)

## 2019-07-19 LAB — COMPREHENSIVE METABOLIC PANEL
ALT: 54 U/L — ABNORMAL HIGH (ref 0–44)
AST: 68 U/L — ABNORMAL HIGH (ref 15–41)
Albumin: 3.6 g/dL (ref 3.5–5.0)
Alkaline Phosphatase: 107 U/L (ref 38–126)
Anion gap: 19 — ABNORMAL HIGH (ref 5–15)
BUN: 76 mg/dL — ABNORMAL HIGH (ref 8–23)
CO2: 23 mmol/L (ref 22–32)
Calcium: 10.6 mg/dL — ABNORMAL HIGH (ref 8.9–10.3)
Chloride: 117 mmol/L — ABNORMAL HIGH (ref 98–111)
Creatinine, Ser: 2.1 mg/dL — ABNORMAL HIGH (ref 0.44–1.00)
GFR calc Af Amer: 23 mL/min — ABNORMAL LOW (ref >60)
GFR calc non Af Amer: 20 mL/min — ABNORMAL LOW (ref >60)
Glucose, Bld: 147 mg/dL — ABNORMAL HIGH (ref 70–99)
Potassium: 4.4 mmol/L (ref 3.5–5.1)
Sodium: 159 mmol/L — ABNORMAL HIGH (ref 135–145)
Total Bilirubin: 0.9 mg/dL (ref 0.3–1.2)
Total Protein: 8.4 g/dL — ABNORMAL HIGH (ref 6.5–8.1)

## 2019-07-19 MED ORDER — CITALOPRAM HYDROBROMIDE 10 MG PO TABS
20.0000 mg | ORAL_TABLET | Freq: Every day | ORAL | Status: DC
Start: 2019-07-19 — End: 2019-07-21

## 2019-07-19 MED ORDER — SODIUM CHLORIDE 0.9 % IV SOLN
1.0000 g | Freq: Once | INTRAVENOUS | Status: AC
  Administered 2019-07-19: 1 g via INTRAVENOUS
  Filled 2019-07-19: qty 10

## 2019-07-19 MED ORDER — ASPIRIN EC 81 MG PO TBEC: 81.0000 mg | DELAYED_RELEASE_TABLET | Freq: Every day | ORAL | Status: DC

## 2019-07-19 MED ORDER — HYDROCORTISONE 1 % EX CREA
TOPICAL_CREAM | Freq: Two times a day (BID) | CUTANEOUS | Status: DC
  Filled 2019-07-19: qty 28

## 2019-07-19 MED ORDER — LEVOTHYROXINE SODIUM 50 MCG PO TABS
50.0000 ug | ORAL_TABLET | Freq: Every day | ORAL | Status: DC
  Administered 2019-07-20: 07:00:00 50 ug via ORAL
  Filled 2019-07-19: qty 1

## 2019-07-19 MED ORDER — SIMVASTATIN 20 MG PO TABS
40.0000 mg | ORAL_TABLET | Freq: Every day | ORAL | Status: DC
  Filled 2019-07-19: qty 2

## 2019-07-19 MED ORDER — ENOXAPARIN SODIUM 30 MG/0.3ML ~~LOC~~ SOLN
30.0000 mg | SUBCUTANEOUS | Status: DC
  Administered 2019-07-19 – 2019-07-20 (×2): 30 mg via SUBCUTANEOUS
  Filled 2019-07-19 (×2): qty 0.3

## 2019-07-19 MED ORDER — HYDROCORTISONE 0.5 % EX CREA
1.0000 "application " | TOPICAL_CREAM | Freq: Two times a day (BID) | CUTANEOUS | Status: DC
  Filled 2019-07-19: qty 28.35

## 2019-07-19 MED ORDER — SODIUM CHLORIDE 0.9 % IV SOLN
1.0000 g | INTRAVENOUS | Status: DC
  Administered 2019-07-20: 18:00:00 1 g via INTRAVENOUS
  Filled 2019-07-19: qty 10

## 2019-07-19 MED ORDER — SODIUM CHLORIDE 0.9 % IV BOLUS
1000.0000 mL | Freq: Once | INTRAVENOUS | Status: AC
  Administered 2019-07-19: 15:00:00 1000 mL via INTRAVENOUS

## 2019-07-19 MED ORDER — DEXTROSE 5 % IV SOLN: INTRAVENOUS | Status: DC

## 2019-07-19 MED ORDER — DIVALPROEX SODIUM 125 MG PO CSDR
125.0000 mg | DELAYED_RELEASE_CAPSULE | Freq: Two times a day (BID) | ORAL | Status: DC
Start: 2019-07-19 — End: 2019-07-21
  Filled 2019-07-19 (×2): qty 1

## 2019-07-19 NOTE — Progress Notes (Signed)
Patent examiner [facility])  Hospital Liaison: RN note   Notified by Sharen Heck, PA of possible family interest for [facility] services at [facility] after discharge.   Spoke with patient's daughter, Corrie Dandy to explain services and answer questions. Mary asked that we follow up with her over the weekend.  [facility] brochures emailed to [facility] to review. A hospital liaison will follow up this weekend.  Please call with any hospice related questions.  Thank you for this referral.  Elsie Saas, RN, CCM  [facility] Liaison (listed on AM[facility] under [facility] [address])  [phone]

## 2019-07-19 NOTE — ED Notes (Addendum)
Per MD, patient does not need covid test since patient tested positive on 12/30, but does not want to be placed aound covid patients since she not going to be treated for covid. Hence the admittion for acute encephalopathy and on asymptomatic screening protocol. Per MD, this is the best admitting order for the patient at this time.

## 2019-07-19 NOTE — H&P (Addendum)
Date: 07/19/2019               Patient Name:  Diamond [name] MRN: [id number]  DOB: 10-03-1926 Age / Sex: 86 y.o., female   PCP: Patient, No Pcp Per         Medical Service: Internal Medicine Teaching Service         Attending Physician: Dr. Burns Spain, MD    First Contact: Dr. Huel Cote Pager: [phone]  Second Contact: Dr. Chesley Mires Pager: [phone]       After Hours (After 5p/  First Contact Pager: [phone]  weekends / holidays): Second Contact Pager: [phone]   Chief Complaint: Altered mental status   History of Present Illness:   Diamond [name] is a 85 year old female with a past medical history of Alzheimer's dementia, hypothyroidism, hypertension, former alcohol use disorder, who presents to the ED with complaints of altered mental status.  Patient is unable to answer questions due to disorientation.  Her daughter, Diamond [name], is at bedside and provides history.  Diamond [name] states that her mom tested positive for Covid on 07/01/2019 and the memory care unit she lives at reported that she has been having decreased appetite.  For the past 3 to 5 days, her appetite has been severely decreased with with occasional refusal to eat or pocketing within the mouth and spitting out later.  No known complaints of pain at this time.  In addition, she has been leaning towards the left with gaze preference towards the left.  The facility has reported decreased urine output, but no reported fevers or chills.  Diamond [name] denies any known strokes with notes some had been noted on CT images in the past after falls at her facility.  Diamond [name] reports that her mom has been living in the memory care unit since August of this year, but previously was living in an assisted living facility since 2018.  She has been wheelchair-bound since 2019.  Her baseline is talkative with following commands.  Since October, she has noticed increased frailty and weight loss.  Limited records from facility show she has been  taking Eliquis for the past 10 days as well as azithromycin but unknown for what infectious cause.  ED course: Initial lactic acid elevated at 3.9 improved to 2.2 after fluid resuscitation.  CMP remarkable for hypernatremia at 159, hyperchloremia at 117, elevated BUN of 76, elevated creatinine of 2.10, mild hypercalcemia at 10.6 and mildly elevated anion gap at 19.  CBC remarkable for mild leukocytosis at 11.2 and elevated MCV of 101.7 with thrombocytosis of 664.  UA positive for small leukocytes and few bacteria.  Meds:  No current facility-administered medications on file prior to encounter.   Current Outpatient Medications on File Prior to Encounter  Medication Sig Dispense Refill  . acetaminophen (TYLENOL) 650 MG CR tablet Take 650 mg by mouth every 8 (eight) hours as needed for pain.    Marland Kitchen aspirin EC 81 MG tablet Take 1 tablet (81 mg total) by mouth daily. 30 tablet 0  . calcium-vitamin D (OSCAL WITH D) 500-200 MG-UNIT tablet Take 1 tablet by mouth 2 (two) times daily.    . Cholecalciferol (VITAMIN D) 2000 units CAPS Take 2,000 Units by mouth daily.    . ciprofloxacin (CIPRO) 500 MG tablet Take 1 tablet (500 mg total) by mouth 2 (two) times daily. 14 tablet 0  . citalopram (CELEXA) 20 MG tablet Take 20 mg by mouth daily.    . diphenhydramine-acetaminophen (TYLENOL PM) 25-500 MG  TABS tablet Take 1 tablet by mouth at bedtime as needed.    . divalproex (DEPAKOTE SPRINKLE) 125 MG capsule Take 125 mg by mouth 2 (two) times daily.    Marland Kitchen guaiFENesin (ROBITUSSIN) 100 MG/5ML liquid Take 200 mg by mouth every 6 (six) hours as needed for cough.    . hydrocortisone 2.5 % cream Apply 1 application topically 2 (two) times daily.    . iron polysaccharides (NIFEREX) 150 MG capsule Take 150 mg by mouth daily.    Marland Kitchen levothyroxine (SYNTHROID, LEVOTHROID) 50 MCG tablet Take 50 mcg by mouth daily before breakfast.    . loperamide (IMODIUM A-D) 2 MG tablet Take 2 mg by mouth 4 (four) times daily as needed for  diarrhea or loose stools.    . meloxicam (MOBIC) 7.5 MG tablet Take 7.5 mg by mouth every 12 (twelve) hours as needed for pain.    . Multiple Vitamin (MULTIVITAMIN WITH MINERALS) TABS tablet Take 1 tablet by mouth daily.    Marland Kitchen oxycodone-acetaminophen (PERCOCET) 2.5-325 MG tablet Take 1 tablet by mouth every 12 (twelve) hours as needed for pain.    . ranitidine (ZANTAC) 75 MG tablet Take 75 mg by mouth daily.    . simvastatin (ZOCOR) 40 MG tablet Take 40 mg by mouth at bedtime.      Allergies: Allergies as of 07/19/2019 - Review Complete 07/19/2019  Allergen Reaction Noted  . Codeine Swelling 06/07/2014  . Aripiprazole Other (See Comments) 09/14/2017  . Penicillins Swelling 04/17/2013  . Prednisone Other (See Comments) 09/14/2017  . Tramadol Other (See Comments) 09/14/2017   Past Medical History:  Diagnosis Date  . Arthritis    "everywhere"  . Dementia (HCC)   . High cholesterol   . History of blood transfusion    "related to my hip OR"  . History of gout   . Hypertension   . Hypothyroidism   . Migraine    "q 5-6 years" (06/06/2014)   Family History:  Family History  Problem Relation Age of Onset  . Heart attack Father    Social History:  Currently living at memory care unit at [facility] Former alcohol use disorder, no alcohol intake since 2018 Former smoker, quit 25 years ago No known drug use  Review of Systems: A complete ROS was negative except as per HPI.   Physical Exam: Blood pressure (!) 160/99, pulse 78, temperature (!) 97.4 F (36.3 C), temperature source Rectal, resp. rate 14, height 5' (1.524 m), weight 50 kg, SpO2 99 %.  Physical Exam Vitals and nursing note reviewed.  Constitutional:      General: She is not in acute distress.    Appearance: She is normal weight. She is not ill-appearing.     Comments: Cooperative at times, largely unable to follow commands  Eyes:     Pupils: Pupils are equal, round, and reactive to light.  Cardiovascular:      Rate and Rhythm: Normal rate and regular rhythm.     Heart sounds: No murmur.  Pulmonary:     Effort: Pulmonary effort is normal. No respiratory distress.     Breath sounds: Normal breath sounds. No wheezing or rales.  Abdominal:     General: Bowel sounds are normal. There is no distension.     Palpations: Abdomen is soft.     Tenderness: There is no abdominal tenderness.  Musculoskeletal:        General: No deformity or signs of injury.     Right lower leg: No edema.  Left lower leg: No edema.  Skin:    General: Skin is warm and dry.  Neurological:     Mental Status: She is disoriented.     Cranial Nerves: Cranial nerve deficit: Unable to obtain due to difficulty following commands at this point.     Motor: Weakness (Unable to move left arm on command.  Right arm able to squeeze.  Able to wiggle bilateral feet.) present.     Comments: Unable to move head towards the right, stays facing left side.  Able to move eyes towards right though.     EKG: personally reviewed my interpretation is: Rate 94. Regular sinus rhythm. Diffuse repolarization abnormalities, nonspecific. No ST segment elevation or depression.   CXR: personally reviewed my interpretation is: No opacities noted or pleural effusions.   Assessment & Plan by Problem: Active Problems:   Encephalopathy acute  Ms. Margaret Cockerill is a 86 year old female with a past medical history of Alzheimer's dementia, hypertension, hypothyroidism, currently living at a memory care unit who presents to the ED with complaints of altered mental status and currently admitted for acute encephalopathy.   # Acute Encephalopathy CT head is negative for acute abnormalities. Differential includes encephalopathy 2/2 hypernatremia in light of decreased PO intake vs UTI vs acute CVA given left arm weakness.  Discussed with healthcare proxy, patient's daughter, Stanton Kidney regarding MRI for further evaluation of acute CVA.  We reports they would not like to  pursue intensive work-up such as MRI at this time.  We will work on improving electrolyte status, in addition to obtaining blood and urine cultures.  Lactic acid may represent acute infectious cause.   -Telemetry -Blood cultures -Urine cultures -N.p.o. -Morning CBC, CMP, lactic acid -Rocephin   # Hypernatremia  Significantly hyponatremic with sodium of 159.  Likely secondary to decreased p.o. intake in the past 2 weeks, which has acutely worsened in the past 3-5 days.  Estimated free water deficit of 3.2 L.  We will work on gentle fluid resuscitation.  Unfortunately, patient received 1 L of normal saline in the ED for lactic acidosis, which likely worsened hypernatremia.  -IVF resuscitation: D5W at 150 cc/h for 24-hour -Repeat CMP in a.m.  # Elevated lactic acid  Differential includes 2/2 UTI given UA positive for leukocytes and bacteria. Initially 3.9 but improved to 2.2 after fluids.  CBC also shows mild leukocytosis.  Could be indicative of infectious source versus hypovolemia.   -Urine and blood cultures pending -Repeat lactic acid in the a.m. -Rocephin  # Alzheimer's Dementia  Baseline includes a more talkative demeanor with ability to follow commands.  She was treated with Depakote for behavioral disturbances.  Plan to continue at this time.  -Depakote 125 mg BID   # Hypothyroidism -Continue Synthyroid 50 mcg QD    Code: DNI/DNR DVT ppx: Lovenox  Diet: NPO  Dispo: Admit patient to Inpatient with expected length of stay greater than 2 midnights.  Signed: Dr. Jose Persia Internal Medicine PGY-1  Pager: [phone] 07/19/2019, 6:37 PM

## 2019-07-19 NOTE — ED Notes (Signed)
Pt dx with covid 07/05/19

## 2019-07-19 NOTE — ED Notes (Signed)
Tele   Paged admitting to RN Fayrene Fearing

## 2019-07-19 NOTE — ED Triage Notes (Signed)
Pt bib ems from [facility] with reports of AMS with L sided lean/gaze X4days. Pt with decrease po intake. Baseline confused but able to hold a conversation. Pt not following commands and not answering questions. 128/72 HR 120 ST with pvc;s 95% RA RR 14 CBG 251

## 2019-07-19 NOTE — ED Provider Notes (Signed)
[facility] EMERGENCY DEPARTMENT Provider Note   CSN: [id number] Arrival date & time: 07/19/19  1120     History Chief Complaint  Patient presents with  . Altered Mental Status    Diamond [name] is a 86 y.o. female presents to ER by EMS from [facility] for evaluation of altered mental status.  Level 5 caveat due to advanced dementia, patient non verbal during me exam. Patient is awake laying in bed with left arm curled up onto chest, looking left.  She groans with some questioning or repositioning.  Patient does not follow commands.  Will call POA and facility to obtain more information. Per EMS and triage note patient sent to ER for AMS with left sided lean and gaze for 4 days, baseline confused and now not following commands or answering questions which is different from her usual.   HPI     Past Medical History:  Diagnosis Date  . Arthritis    "everywhere"  . Dementia (HCC)   . High cholesterol   . History of blood transfusion    "related to my hip OR"  . History of gout   . Hypertension   . Hypothyroidism   . Migraine    "q 5-6 years" (06/06/2014)    Patient Active Problem List   Diagnosis Date Noted  . High cholesterol   . UTI (lower urinary tract infection)   . Anemia 06/06/2014  . E. coli UTI 06/06/2014  . Generalized weakness 06/06/2014  . Left humeral fracture 06/06/2014  . Depression 06/06/2014  . Hypothyroidism 06/06/2014  . Overactive bladder 06/06/2014  . Normocytic anemia 06/06/2014  . Frequent falls   . Bleeding gastrointestinal     Past Surgical History:  Procedure Laterality Date  . ABDOMINAL HYSTERECTOMY    . ANKLE FRACTURE SURGERY Right 1988   "crushed it"  . CHOLECYSTECTOMY  1970's  . DILATION AND CURETTAGE OF UTERUS    . FRACTURE SURGERY    . JOINT REPLACEMENT    . TOTAL HIP ARTHROPLASTY Right 1980's  . TUBAL LIGATION       OB History   No obstetric history on file.     Family History  Problem Relation  Age of Onset  . Heart attack Father     Social History   Tobacco Use  . Smoking status: Former Smoker    Packs/day: 2.00    Years: 40.00    Pack years: 80.00    Types: Cigarettes    Quit date: 05/05/1987    Years since quitting: 32.2  . Smokeless tobacco: Never Used  Substance Use Topics  . Alcohol use: Yes    Alcohol/week: 7.0 standard drinks    Types: 7 Glasses of wine per week  . Drug use: No    Home Medications Prior to Admission medications   Medication Sig Start Date End Date Taking? Authorizing Provider  acetaminophen (TYLENOL) 650 MG CR tablet Take 650 mg by mouth every 8 (eight) hours as needed for pain.    [provider]  aspirin EC 81 MG tablet Take 1 tablet (81 mg total) by mouth daily. 03/29/17   Pricilla LovelessGoldston, Scott, MD  calcium-vitamin D (OSCAL WITH D) 500-200 MG-UNIT tablet Take 1 tablet by mouth 2 (two) times daily.    [provider]  Cholecalciferol (VITAMIN D) 2000 units CAPS Take 2,000 Units by mouth daily.    [provider]  ciprofloxacin (CIPRO) 500 MG tablet Take 1 tablet (500 mg total) by mouth 2 (  two) times daily. Patient not taking: Reported on 04/09/2018 02/26/18   Vanetta Mulders, MD  citalopram (CELEXA) 20 MG tablet Take 20 mg by mouth daily.    [provider]  diphenhydramine-acetaminophen (TYLENOL PM) 25-500 MG TABS tablet Take 1 tablet by mouth at bedtime as needed.    [provider]  divalproex (DEPAKOTE SPRINKLE) 125 MG capsule Take 125 mg by mouth 2 (two) times daily.    [provider]  guaiFENesin (ROBITUSSIN) 100 MG/5ML liquid Take 200 mg by mouth every 6 (six) hours as needed for cough.    [provider]  hydrocortisone 2.5 % cream Apply 1 application topically 2 (two) times daily.    [provider]  iron polysaccharides (NIFEREX) 150 MG capsule Take 150 mg by mouth daily.    [provider]  levothyroxine (SYNTHROID, LEVOTHROID) 50 MCG tablet Take 50 mcg by  mouth daily before breakfast.    [provider]  loperamide (IMODIUM A-D) 2 MG tablet Take 2 mg by mouth 4 (four) times daily as needed for diarrhea or loose stools.    [provider]  meloxicam (MOBIC) 7.5 MG tablet Take 7.5 mg by mouth every 12 (twelve) hours as needed for pain.    [provider]  Multiple Vitamin (MULTIVITAMIN WITH MINERALS) TABS tablet Take 1 tablet by mouth daily.    [provider]  oxycodone-acetaminophen (PERCOCET) 2.5-325 MG tablet Take 1 tablet by mouth every 12 (twelve) hours as needed for pain.    [provider]  ranitidine (ZANTAC) 75 MG tablet Take 75 mg by mouth daily.    [provider]  simvastatin (ZOCOR) 40 MG tablet Take 40 mg by mouth at bedtime.     [provider]    Allergies    Codeine, Aripiprazole, Penicillins, Prednisone, and Tramadol  Review of Systems   Review of Systems  Unable to perform ROS: Mental status change    Physical Exam Updated Vital Signs BP (!) 134/91   Pulse 92   Temp (!) 97.4 F (36.3 C) (Rectal)   Resp (!) 23   SpO2 99%   Physical Exam Constitutional:      General: She is awake.     Comments: Chronically ill appearing, frail. Awake.   HENT:     Head: Normocephalic.     Comments: No visualized signs of trauma to face/scalp, no tenderness     Nose: Nose normal.     Mouth/Throat:     Comments: Dry lips and MMM, patient does not follow commands, cannot open her mouth. Food collected around teeth.  Eyes:     General: Lids are normal.     Comments: PERRL bilaterally. Patient with left sided gaze, occasionally looks midline and right. Head position to left, she winces and groans when I attempt to reposition head   Cardiovascular:     Rate and Rhythm: Normal rate.     Comments: No LE edema. No calf tenderness.  Pulmonary:     Effort: Pulmonary effort is normal. No respiratory distress.     Comments: Slightly diminished lung sounds to lower lobes, no  increased work of breathing. No wheezing, crackles. Abdominal:     General: Abdomen is flat.     Palpations: Abdomen is soft.     Comments: No perceived abd tenderness, palpation throughout without wincing, groaning   Musculoskeletal:        General: Normal range of motion.     Cervical back: Normal range of motion.  Comments: Passive ROM of upper/lower extremities without signs of pain, some resistance to movement throughout   Neurological:     Mental Status: She is confused.     Comments: PERRL bilaterally Attempts to groan/moan in response to questioning sometimes, non verbal Briefly looks at you but mostly gaze is left sided/midline Does not follow commands Withdraws to pain Resistance to passive ROM of upper/lower extremities, head/neck repositioning.  Psychiatric:        Behavior: Behavior normal.     ED Results / Procedures / Treatments   Labs (all labs ordered are listed, but only abnormal results are displayed) Labs Reviewed  CBC WITH DIFFERENTIAL/PLATELET - Abnormal; Notable for the following components:      Result Value   WBC 11.2 (*)    HCT 46.8 (*)    MCV 101.7 (*)    MCHC 29.7 (*)    Platelets 664 (*)    Neutro Abs 8.4 (*)    Abs Immature Granulocytes 0.13 (*)    All other components within normal limits  COMPREHENSIVE METABOLIC PANEL - Abnormal; Notable for the following components:   Sodium 159 (*)    Chloride 117 (*)    Glucose, Bld 147 (*)    BUN 76 (*)    Creatinine, Ser 2.10 (*)    Calcium 10.6 (*)    Total Protein 8.4 (*)    AST 68 (*)    ALT 54 (*)    GFR calc non Af Amer 20 (*)    GFR calc Af Amer 23 (*)    Anion gap 19 (*)    All other components within normal limits  LACTIC ACID, PLASMA - Abnormal; Notable for the following components:   Lactic Acid, Venous 3.9 (*)    All other components within normal limits  LACTIC ACID, PLASMA - Abnormal; Notable for the following components:   Lactic Acid, Venous 2.2 (*)    All other components  within normal limits  URINALYSIS, ROUTINE W REFLEX MICROSCOPIC - Abnormal; Notable for the following components:   Color, Urine AMBER (*)    APPearance HAZY (*)    Protein, ur 30 (*)    Leukocytes,Ua SMALL (*)    Bacteria, UA FEW (*)    All other components within normal limits  CBG MONITORING, ED - Abnormal; Notable for the following components:   Glucose-Capillary 137 (*)    All other components within normal limits  URINE CULTURE    EKG EKG Interpretation  Date/Time:  Friday July 19 2019 12:20:38 EST Ventricular Rate:  94 PR Interval:    QRS Duration: 85 QT Interval:  370 QTC Calculation: 463 R Axis:   61 Text Interpretation: Sinus rhythm Short PR interval Nonspecific repol abnormality, diffuse leads Confirmed by Kristine Royal [phone]) on 07/19/2019 12:27:30 PM   Radiology CT Head Wo Contrast  Result Date: 07/19/2019 CLINICAL DATA:  Gaze disturbance.  Altered mental status. EXAM: CT HEAD WITHOUT CONTRAST TECHNIQUE: Contiguous axial images were obtained from the base of the skull through the vertex without intravenous contrast. COMPARISON:  02/26/2018. FINDINGS: Brain: There is no evidence for acute hemorrhage, hydrocephalus, mass lesion, or abnormal extra-axial fluid collection. No definite CT evidence for acute infarction. Diffuse loss of parenchymal volume is consistent with atrophy. Patchy low attenuation in the deep hemispheric and periventricular white matter is nonspecific, but likely reflects chronic microvascular ischemic demyelination. Vascular: No hyperdense vessel or unexpected calcification. Skull: No evidence for fracture. No worrisome lytic or sclerotic lesion. Sinuses/Orbits: The visualized paranasal sinuses  and mastoid air cells are clear. Visualized portions of the globes and intraorbital fat are unremarkable. Other: None. IMPRESSION: 1. Stable exam.  No acute intracranial abnormality. 2. Atrophy with chronic small vessel white matter ischemic disease.  Electronically Signed   By: Misty Stanley M.D.   On: 07/19/2019 14:15   DG Chest Portable 1 View  Result Date: 07/19/2019 CLINICAL DATA:  Altered mental status. EXAM: PORTABLE CHEST 1 VIEW COMPARISON:  07/24/2017 FINDINGS: Cardiac silhouette is normal in size. No mediastinal or hilar masses. No evidence of adenopathy. Clear lungs.  No pleural effusion or pneumothorax. Skeletal structures are demineralized but grossly intact. IMPRESSION: No active disease. Electronically Signed   By: Lajean Manes M.D.   On: 07/19/2019 12:34    Procedures Procedures (including critical care time)  Medications Ordered in ED Medications  cefTRIAXone (ROCEPHIN) 1 g in sodium chloride 0.9 % 100 mL IVPB (has no administration in time range)  sodium chloride 0.9 % bolus 1,000 mL (1,000 mLs Intravenous New Bag/Given 07/19/19 1503)    ED Course  I have reviewed the triage vital signs and the nursing notes.  Pertinent labs & imaging results that were available during my care of the patient were reviewed by me and considered in my medical decision making (see chart for details).  Clinical Course as of Jul 18 1516  Fri Jul 19, 2019  1204 Patient evaluated.  Awake and moaning, spontaneously moving right extremity but unable to provide history. Will obtain more information from facility/POA   [CG]  1207 Spoke to POA daughter [facility] Reports h/o dementia August 2020 at [facility] Dec 2020 tested positive for COVID, past quarantine period  Since December after COVID decreased eating Yesterday afternoon from [facility] was told not eating at all, holding food in her mouth and not swallowing.  Decided 24 hr obs. This morning continued to not eat and holding food, only drinking orange juice. Noticed leaning to left side.  Daughter states in the past when she leans on side she is constipated. They tried to lay her down and patient noticed to be gasping for air. Paramedics called to evaluate decided to bring to ER.    Daughter was told not eating, drinking very little and gasping for air  Daughter spoke to patient end of October would answer with a few words and short sentences but struggle to get words out, but does not initiate conversation.  Daughter admits dementia has been progressing over last few months.  Discussed physical exam findings here including moaning but not speaking and not following commands, daughter states this is a change for her based on recent reports from facility.  Discussed work up for AMS is broad and extensive in ER including labs, CXR, UA, CT head.  Possibilities are broad including worsening dementia vs medical issue like infection, stroke given exam findings.  Daughter agreeable to start with labs, xray urine, head CT. Unsure about extensive stroke work up at this time, will await work up results and reassess. Daughter en route to ER now.   [CG]  1232 Temp(!): 94.2 F (34.6 C) [CG]  1232 WBC(!): 11.2 [CG]  1232 HCT(!): 46.8 [CG]  1232 MCV(!): 101.7 [CG]  1232 Platelets(!): 664 [CG]  1232 NEUT#(!): 8.4 [CG]  1232 Abs Immature Granulocytes(!): 0.13 [CG]  1240 Spoke to med tech at [facility]  States pt not as talkative, usually talking or talking to herself. Now lethargic not talking or answering or following commands. Leaning over Stuffy nose drooling. Not  eating food only fluids, drank ensure today, holding food/pills in her mouth. X 4 days  Usually patient moves upper arm, does not move lower extremities, speaks in 1-2 words and sometimes sentences but mostly confused  Patient tested positive for COVID 15 days ago. Patient completed antibiotics but still taking vitamins. Med tech to fax over paperwork  Asked    [CG]  1420 Like 2/2 dehydration. No infectious source found so far.   Lactic Acid, Venous(!!): 3.9 [CG]  1422 IMPRESSION: 1. Stable exam. No acute intracranial abnormality. 2. Atrophy with chronic small vessel white matter ischemic disease.  CT Head Wo Contrast  [CG]  1423 Diffuse non specific TW abnormalities   EKG 12-Lead [CG]  1512 Lactic Acid, Venous(!!): 2.2 [CG]  1514 Updated daughter regarding patient's work up. Discussed UA slightly suspicious for UTI.  Discussed standard to treat with antibiotics, patient's daughter is agreeable to try antibiotics for this. Discussed options to admit for IVF and antibiotics, hospice/palliative and SW consults vs discharge back to facility or home.  Daughter would like patient to be admitted to arrange long term car/goals of care .   [CG]    Clinical Course User Index [CG] Jerrell Mylar   MDM Rules/Calculators/A&P                      EMR reviewed.  I saw patient in ER 07/2019 for AMS. Mental status at that time was much better.  Per facility and daughter her mental status has quickly declined. Facility faxed positive COVID test on 07/02/2020.  Hypothermic here but otherwise HD stable in no signs of respiratory distress or imminent medical instability.   Ddx is broad for patient including rapid progression of dementia vs occult infection.  Daughter reports pt has had "mini" stroke in the past, CVA is a possibility although neuro exam today is very limited.    Contacted family and facility to obtain collateral information. Confirmed goals of care with daughter who is very reasonable.  She knows patient would not want heroic invasive efforts.  Patient is DNR.  Daughter in agreement with searching for obvious cause of AMS including labs, UA, CXR, head CT.  Discussed possibility of stroke that would require more extensive imaging, neuro consults, interventions but daughter unsure this is in alignment with patient's wishes and goals of care.  Daughter is open to hospice/palliative care.   1420: ER work up personally reviewed as above shows hypernatremia, hemoconcentration with AKI.    1511:   UA with small leuks, WBC and few bacteria. UTI could explain AMS.  Discussed work up with daughter. Discussed  options of treatment including admission for IVF, abx, hospice/palliative and SW services vs discharge to facility or home based on patient's goals of care.  Daughter comfortable with admission. She is open to hospice/palliatice care and supportive minimally invasive care.  Hospice has been consulted, [facility] RN has spoken to daughter.  Final Clinical Impression(s) / ED Diagnoses Final diagnoses:  Transient alteration of awareness    Rx / DC Orders ED Discharge Orders    None       Liberty Handy, PA-C 07/19/19 1518    Wynetta Fines, MD 07/22/19 1506

## 2019-07-20 ENCOUNTER — Other Ambulatory Visit: Payer: Self-pay

## 2019-07-20 ENCOUNTER — Encounter (HOSPITAL_COMMUNITY): Payer: Self-pay | Admitting: Internal Medicine

## 2019-07-20 DIAGNOSIS — G309 Alzheimer's disease, unspecified: Secondary | ICD-10-CM

## 2019-07-20 DIAGNOSIS — N390 Urinary tract infection, site not specified: Secondary | ICD-10-CM

## 2019-07-20 DIAGNOSIS — E039 Hypothyroidism, unspecified: Secondary | ICD-10-CM

## 2019-07-20 DIAGNOSIS — Z66 Do not resuscitate: Secondary | ICD-10-CM

## 2019-07-20 DIAGNOSIS — G9341 Metabolic encephalopathy: Secondary | ICD-10-CM

## 2019-07-20 DIAGNOSIS — I10 Essential (primary) hypertension: Secondary | ICD-10-CM

## 2019-07-20 DIAGNOSIS — Z79890 Hormone replacement therapy (postmenopausal): Secondary | ICD-10-CM

## 2019-07-20 DIAGNOSIS — E870 Hyperosmolality and hypernatremia: Principal | ICD-10-CM

## 2019-07-20 DIAGNOSIS — Z79899 Other long term (current) drug therapy: Secondary | ICD-10-CM

## 2019-07-20 DIAGNOSIS — N179 Acute kidney failure, unspecified: Secondary | ICD-10-CM

## 2019-07-20 DIAGNOSIS — Z8616 Personal history of COVID-19: Secondary | ICD-10-CM

## 2019-07-20 DIAGNOSIS — R7989 Other specified abnormal findings of blood chemistry: Secondary | ICD-10-CM

## 2019-07-20 LAB — COMPREHENSIVE METABOLIC PANEL
ALT: 57 U/L — ABNORMAL HIGH (ref 0–44)
AST: 67 U/L — ABNORMAL HIGH (ref 15–41)
Albumin: 3 g/dL — ABNORMAL LOW (ref 3.5–5.0)
Alkaline Phosphatase: 89 U/L (ref 38–126)
Anion gap: 13 (ref 5–15)
BUN: 61 mg/dL — ABNORMAL HIGH (ref 8–23)
CO2: 28 mmol/L (ref 22–32)
Calcium: 9.5 mg/dL (ref 8.9–10.3)
Chloride: 108 mmol/L (ref 98–111)
Creatinine, Ser: 1.44 mg/dL — ABNORMAL HIGH (ref 0.44–1.00)
GFR calc Af Amer: 37 mL/min — ABNORMAL LOW (ref >60)
GFR calc non Af Amer: 32 mL/min — ABNORMAL LOW (ref >60)
Glucose, Bld: 172 mg/dL — ABNORMAL HIGH (ref 70–99)
Potassium: 3.9 mmol/L (ref 3.5–5.1)
Sodium: 149 mmol/L — ABNORMAL HIGH (ref 135–145)
Total Bilirubin: 0.7 mg/dL (ref 0.3–1.2)
Total Protein: 7.1 g/dL (ref 6.5–8.1)

## 2019-07-20 LAB — CBC
HCT: 40.1 % (ref 36.0–46.0)
Hemoglobin: 12.2 g/dL (ref 12.0–15.0)
MCH: 30 pg (ref 26.0–34.0)
MCHC: 30.4 g/dL (ref 30.0–36.0)
MCV: 98.8 fL (ref 80.0–100.0)
Platelets: 462 10*3/uL — ABNORMAL HIGH (ref 150–400)
RBC: 4.06 MIL/uL (ref 3.87–5.11)
RDW: 13.4 % (ref 11.5–15.5)
WBC: 10.5 10*3/uL (ref 4.0–10.5)
nRBC: 0 % (ref 0.0–0.2)

## 2019-07-20 LAB — RESPIRATORY PANEL BY RT PCR (FLU A&B, COVID)
Influenza A by PCR: NEGATIVE
Influenza B by PCR: NEGATIVE
SARS Coronavirus 2 by RT PCR: NEGATIVE

## 2019-07-20 LAB — VALPROIC ACID LEVEL: Valproic Acid Lvl: <10 ug/mL — ABNORMAL LOW (ref 50.0–100.0)

## 2019-07-20 LAB — LACTIC ACID, PLASMA: Lactic Acid, Venous: 2 mmol/L (ref 0.5–1.9)

## 2019-07-20 LAB — MRSA PCR SCREENING: MRSA by PCR: NEGATIVE

## 2019-07-20 LAB — TSH: TSH: 4.128 u[IU]/mL (ref 0.350–4.500)

## 2019-07-20 MED ORDER — CHLORHEXIDINE GLUCONATE 0.12 % MT SOLN
15.0000 mL | Freq: Two times a day (BID) | OROMUCOSAL | Status: DC
  Administered 2019-07-20 – 2019-07-22 (×4): 15 mL via OROMUCOSAL
  Filled 2019-07-20 (×3): qty 15

## 2019-07-20 NOTE — ED Notes (Addendum)
Patient is Covid + per family on 07/01/19 at care facility.  Patient does not have any documentation in her chart as to this result.  Patient was not going to have a bed assignment due to no result in chart.  This RN spoke with [facility] about this, we decided that since patient would not be assigned a bed until the result was back, the order was placed.

## 2019-07-20 NOTE — Progress Notes (Signed)
Date: 07/20/2019  Patient name: Diamond [name]  Medical record number: [id number]  Date of birth: [birth year]   I have seen and evaluated Diamond [name] and discussed their care with the Residency Team. Diamond [name] is a 86 yo resident of a memory care unit who was admitted to our service yesterday for acute metabolic encephalopathy.  She is a resident of a memory care unit due to Alzheimer's dementia and had tested positive for Covid at the facility on December 28.  Since then, the patient reportedly had anorexia with an acute worsening 3 to 5 days prior to admission.  She had no fever noted at the facility.  Her baseline is talkative and able to follow commands.    Hospital work-up included a CT of the head which was negative for any acute abnormalities.  She is found to have hypernatremia, lactic acidosis, and a UA showing small leukocyte esterase and few bacteria.  This morning, no family was at bedside.  The patient was somnolent so all HPI information was obtained from Dr. Renaee Munda H&P.  PMHx includes Alzheimer's dementia, hypertension, hypothyroidism, nonambulatory since 2019  Vitals:   07/20/19 1015 07/20/19 1045  BP: 125/68 140/67  Pulse: 62 (!) 59  Resp:    Temp:    SpO2: 97% 100%  Temperature 97.4, respiratory rate 16 O2 sat 97% on room air General elderly female sleeping and snoring, no overt respiratory distress H RRR no MRG Lungs clear anteriorly Abdomen positive bowel sounds Extremities no edema Neuro somnolent at 7 AM  Pertinent labs Sodium 159 - 149 Chloride 117 - 108 Creatinine 2.1 - 1.44 Calcium 10.7 - 9.5 Albumin 3.6 - 3.0 AST 68 - 67  ALT 54 - 57 Lactic acid 3.9 - 2.0 Platelets 664 - 462  UA small leukocyte Estrace, negative nitrite, 6-10 WBCs  I personally viewed the CXR images and confirmed my reading with the official read.  1 view, AP, rotated, good inspiration, no overt abnormality  I personally viewed the EKG and confirmed my reading with  the official read.  Sinus rhythm, normal axis, no ischemic changes  Assessment and Plan: I have seen and evaluated the patient as outlined above. I agree with the formulated Assessment and Plan as detailed in the residents' note, with the following changes:  Diamond [name] is a 86 yo resident of a memory care unit who was admitted to our service yesterday for acute metabolic encephalopathy.  The etiology of her acute metabolic encephalopathy is likely multifactorial including hypernatremia due to decreased oral intake, mild hypercalcemia, and a possible UTI.  Additionally, she is on Depakote so we will check a Depakote level and on Synthroid so we will check a TSH as a high Depakote level or hypothyroidism could definitely contribute to her altered mental status.  Dr. Huel Cote spoke to the patient's daughter and the patient is DNR/DNI, no intensive or invasive work-up, but intervening on underlying issues that are easily correctable.  1.  Acute metabolic encephalopathy in setting of Alzheimer's dementia -NPO. until more alert -TSH & Depakote level -Blood & urine cxs -Empiric Rocephin for 3 days  2.  Hyponatremia secondary to decreased oral intake -Continue D5W  3.  AKI secondary to decreased oral intake -Continue IV fluids  4.  Mild hypercalcemia -Avoid dehydration -Work-up at this time unlikely to contribute to patient's overall prognosis  Goals  -bedside swallow evaluation once more alert so that we can start oral intake -Return to memory care unit likely on the 17th or  18th pending improvement in mental status and ability to take oral intake-  Bartholomew Crews, MD 1/16/202111:27 AM

## 2019-07-20 NOTE — Progress Notes (Signed)
Subjective:   Diamond [name] was seen and evaluated on morning rounds. She was sleeping comfortably on our evaluation. Did not push too hard to wake her up. No significant events overnight.   Objective:  Vital signs in last 24 hours: Vitals:   07/20/19 0245 07/20/19 0330 07/20/19 0345 07/20/19 0519  BP: (!) 141/93 (!) 142/60 (!) 157/65 131/68  Pulse: 61 61 65 (!) 57  Resp: 13 17 14 20   Temp:      TempSrc:      SpO2: 96% 99% 95% 99%  Weight:      Height:        Physical Exam Vitals and nursing note reviewed.  Constitutional:      General: She is sleeping. She is not in acute distress. Cardiovascular:     Rate and Rhythm: Normal rate and regular rhythm.     Heart sounds: No murmur.  Pulmonary:     Effort: Pulmonary effort is normal. No respiratory distress.     Breath sounds: No wheezing.  Abdominal:     General: Bowel sounds are normal. There is no distension.     Palpations: Abdomen is soft.     Tenderness: There is no abdominal tenderness.  Musculoskeletal:     Right lower leg: No edema.     Left lower leg: No edema.  Skin:    General: Skin is warm and dry.  Neurological:     Mental Status: Mental status is at baseline. She is disoriented.     Motor: Weakness: unable to assess as patient difficult to wake.    Assessment/Plan:  Active Problems:   Encephalopathy acute  Diamond [name] is a 86 year old female with a past medical history of Alzheimer's dementia, hypertension, hypothyroidism, currently living at a memory care unit who presents to the ED with complaints of altered mental status and currently admitted for acute encephalopathy.   # Acute Encephalopathy CT head is negative for acute abnormalities. Differential includes encephalopathy 2/2 hypernatremia in light of decreased PO intake vs UTI vs acute CVA given left arm weakness.  Family does not want to pursue MRI. Unable to assess mental status as patient asleep during evaluation this AM. Will continue  to current management.   -Telemetry -Blood cultures pending -Urine cultures pending -N.p.o. -Rocephin   # Hypernatremia  Improving overnight with sodium of 149 this AM.   -IVF resuscitation: D5W at 150 cc/h for 24-hour -Repeat CMP in a.m.  # UTI UA positive for small leukocytes and few bacteria. Given altered mental status, antibiotics were started on 1/15. Urine culture pending still.   - Rocephin   # Acute Kidney Injury Likely in the setting of hypovolemia due to decreased PO intake. Improving this AM. Will continue IV fluids   - IVF resuscitation - CMP in the AM   # Elevated LFTs  Mildly elevated AST and ALT on admission that are stable and unchanged this AM. Patient has a distant history of alcohol use disorder, but past LFTs were normal. Unknown etiology at this point, so will monitor closely.   - CMP in the AM  # Alzheimer's Dementia  Baseline includes a more talkative demeanor with ability to follow commands.  She was treated with Depakote for behavioral disturbances.  Plan to continue at this time.  -Depakote 125 mg BID   # Hypothyroidism -Continue Synthyroid 50 mcg QD   Dispo: Anticipated discharge pending clinical improvement.   Dr. 2/15 Internal Medicine PGY-1  Pager: [phone] 07/20/2019, 6:49 AM

## 2019-07-21 DIAGNOSIS — Z515 Encounter for palliative care: Secondary | ICD-10-CM

## 2019-07-21 DIAGNOSIS — G9340 Encephalopathy, unspecified: Secondary | ICD-10-CM

## 2019-07-21 DIAGNOSIS — F0280 Dementia in other diseases classified elsewhere without behavioral disturbance: Secondary | ICD-10-CM

## 2019-07-21 DIAGNOSIS — G301 Alzheimer's disease with late onset: Secondary | ICD-10-CM

## 2019-07-21 LAB — CBC WITH DIFFERENTIAL/PLATELET
Abs Immature Granulocytes: 0.1 10*3/uL — ABNORMAL HIGH (ref 0.00–0.07)
Basophils Absolute: 0.1 10*3/uL (ref 0.0–0.1)
Basophils Relative: 1 %
Eosinophils Absolute: 0.3 10*3/uL (ref 0.0–0.5)
Eosinophils Relative: 3 %
HCT: 33.1 % — ABNORMAL LOW (ref 36.0–46.0)
Hemoglobin: 10.7 g/dL — ABNORMAL LOW (ref 12.0–15.0)
Immature Granulocytes: 1 %
Lymphocytes Relative: 19 %
Lymphs Abs: 1.8 10*3/uL (ref 0.7–4.0)
MCH: 30.3 pg (ref 26.0–34.0)
MCHC: 32.3 g/dL (ref 30.0–36.0)
MCV: 93.8 fL (ref 80.0–100.0)
Monocytes Absolute: 0.8 10*3/uL (ref 0.1–1.0)
Monocytes Relative: 8 %
Neutro Abs: 6.6 10*3/uL (ref 1.7–7.7)
Neutrophils Relative %: 68 %
Platelets: 394 10*3/uL (ref 150–400)
RBC: 3.53 MIL/uL — ABNORMAL LOW (ref 3.87–5.11)
RDW: 13.4 % (ref 11.5–15.5)
WBC: 9.6 10*3/uL (ref 4.0–10.5)
nRBC: 0 % (ref 0.0–0.2)

## 2019-07-21 LAB — COMPREHENSIVE METABOLIC PANEL
ALT: 36 U/L (ref 0–44)
AST: 40 U/L (ref 15–41)
Albumin: 2.6 g/dL — ABNORMAL LOW (ref 3.5–5.0)
Alkaline Phosphatase: 74 U/L (ref 38–126)
Anion gap: 11 (ref 5–15)
BUN: 36 mg/dL — ABNORMAL HIGH (ref 8–23)
CO2: 23 mmol/L (ref 22–32)
Calcium: 8.7 mg/dL — ABNORMAL LOW (ref 8.9–10.3)
Chloride: 106 mmol/L (ref 98–111)
Creatinine, Ser: 1.24 mg/dL — ABNORMAL HIGH (ref 0.44–1.00)
GFR calc Af Amer: 44 mL/min — ABNORMAL LOW (ref >60)
GFR calc non Af Amer: 38 mL/min — ABNORMAL LOW (ref >60)
Glucose, Bld: 158 mg/dL — ABNORMAL HIGH (ref 70–99)
Potassium: 3.1 mmol/L — ABNORMAL LOW (ref 3.5–5.1)
Sodium: 140 mmol/L (ref 135–145)
Total Bilirubin: 0.6 mg/dL (ref 0.3–1.2)
Total Protein: 6.1 g/dL — ABNORMAL LOW (ref 6.5–8.1)

## 2019-07-21 LAB — URINE CULTURE

## 2019-07-21 LAB — LACTIC ACID, PLASMA: Lactic Acid, Venous: 1.6 mmol/L (ref 0.5–1.9)

## 2019-07-21 MED ORDER — ONDANSETRON 4 MG PO TBDP: 4.0000 mg | ORAL_TABLET | Freq: Four times a day (QID) | ORAL | Status: DC | PRN

## 2019-07-21 MED ORDER — HALOPERIDOL LACTATE 2 MG/ML PO CONC: 0.5000 mg | ORAL | Status: DC | PRN

## 2019-07-21 MED ORDER — HALOPERIDOL 0.5 MG PO TABS: 0.5000 mg | ORAL_TABLET | ORAL | Status: DC | PRN

## 2019-07-21 MED ORDER — POLYVINYL ALCOHOL 1.4 % OP SOLN
1.0000 [drp] | Freq: Four times a day (QID) | OPHTHALMIC | Status: DC | PRN
  Filled 2019-07-21: qty 15

## 2019-07-21 MED ORDER — LORAZEPAM 2 MG/ML PO CONC: 0.5000 mg | ORAL | Status: DC | PRN

## 2019-07-21 MED ORDER — HALOPERIDOL LACTATE 5 MG/ML IJ SOLN: 0.5000 mg | INTRAMUSCULAR | Status: DC | PRN

## 2019-07-21 MED ORDER — LORAZEPAM 2 MG/ML IJ SOLN: 0.5000 mg | INTRAMUSCULAR | Status: DC | PRN

## 2019-07-21 MED ORDER — ONDANSETRON HCL 4 MG/2ML IJ SOLN: 4.0000 mg | Freq: Four times a day (QID) | INTRAMUSCULAR | Status: DC | PRN

## 2019-07-21 MED ORDER — LORAZEPAM 2 MG/ML IJ SOLN
0.2500 mg | Freq: Two times a day (BID) | INTRAMUSCULAR | Status: DC
  Administered 2019-07-21 – 2019-07-22 (×3): 0.25 mg via INTRAVENOUS
  Filled 2019-07-21 (×3): qty 1

## 2019-07-21 MED ORDER — GLYCOPYRROLATE 0.2 MG/ML IJ SOLN
0.2000 mg | INTRAMUSCULAR | Status: DC | PRN
  Administered 2019-07-22 (×2): 0.2 mg via INTRAVENOUS
  Filled 2019-07-21 (×3): qty 1

## 2019-07-21 MED ORDER — GLYCOPYRROLATE 1 MG PO TABS: 1.0000 mg | ORAL_TABLET | ORAL | Status: DC | PRN

## 2019-07-21 MED ORDER — BIOTENE DRY MOUTH MT LIQD: 15.0000 mL | OROMUCOSAL | Status: DC | PRN

## 2019-07-21 MED ORDER — GLYCOPYRROLATE 0.2 MG/ML IJ SOLN: 0.2000 mg | INTRAMUSCULAR | Status: DC | PRN

## 2019-07-21 MED ORDER — FENTANYL CITRATE (PF) 100 MCG/2ML IJ SOLN: 12.5000 ug | INTRAMUSCULAR | Status: DC | PRN

## 2019-07-21 NOTE — Consult Note (Signed)
Consultation Note Date: 07/21/2019   Patient Name: Diamond [name]  DOB: January 08, 1927  MRN: [id number]  Age / Sex: 86 y.o., female  PCP: Patient, No Pcp Per Referring Physician: Bartholomew Crews, MD  Reason for Consultation: Establishing goals of care and Psychosocial/spiritual support  HPI/Patient Profile: 86 y.o.  female  with past medical history of alzheimer's dementia who was admitted on 07/19/2019 with anorexia after not eating or drinking for approximately 3-5 days per history given on admission.  At the time of admission her sodium was 159, BUN 61, creatinine 2.1 and lactic acid was 3.9.  CXR was clear, CT head showed no acute changes, U/A showed few bacteria.  She tested negative for Coronavirus on admission but had tested positive at her facility on 12/28.  She is not on COVID restrictions in the hospital.  Clinical Assessment and Goals of Care:  I have reviewed medical records including EPIC notes, labs and imaging, received report from the RN, examined the patient, and then met at bedside with her Chauncey Reading, daughter Stanton Kidney.  Stanton Kidney added her sister Helene Kelp by speaker phone to discuss diagnosis prognosis, [place], EOL wishes, disposition and options.  I introduced Palliative Medicine as specialized medical care for people living with serious illness. It focuses on providing relief from the symptoms and stress of a serious illness.   We discussed a brief life review of the patient.  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   She enjoyed golf and bowling.  She had 5 children.  1 child died at 71 yo from a congenital heart defect.  Several years ago she moved into memory care.  Approximately 1 year ago she moved from [place] in [place] to [employer] in [place].  As far as functional and nutritional status, the patient's appetite  became poor when she was dx'd with COVID and never improved.  She was drinking for a period of time but in the last 3-5 days has stopped all PO intake.  Now she will stir and mumble a few mutterings but no longer speaks or opens her eyes.  We discussed her current illness and what it means in the larger context of her on-going co-morbidities.  Natural disease trajectory and expectations at EOL were discussed.  I attempted to elicit values and goals of care important to the patient.  Stanton Kidney and Helene Kelp were in agreement that her mother would not want to live this way.  Helene Kelp felt that she has had no dignity since she moved into memory care years ago.   We talked about COVID speeding up the progression of dementia - as their mother has not suffered - the daughters felt it was a blessing to progress the dementia quickly (kind of a silver lining).  The difference between aggressive medical intervention and comfort care was considered in light of the patient's goals of care.  Stanton Kidney and Helene Kelp want to focus only on their mother's comfort.  They request that  all of her medications be discontinued unless they are necessary for comfort.  Hospice and Palliative Care services outpatient were explained and offered.  Questions and concerns were addressed.  The family was encouraged to call with questions or concerns.   Primary Decision Maker:  HCPOA Dtr Stanton Kidney.  Dtr. Hoyle Sauer is the back up HCPOA.    SUMMARY OF RECOMMENDATIONS    Code Status/Advance Care Planning:  DNR   Symptom Management:   Low dose morphine PRN for SOB or pain.  Low dose haldol for agitation PRN  Additional Recommendations (Limitations, Scope, Preferences):  Full Comfort Care  Palliative Prophylaxis:   Turn Reposition and Mouth care is very important  Psycho-social/Spiritual:   Desire for further Chaplaincy support: No discussed.  Prognosis:  Less than two weeks given she is barely waking, no longer taking any PO.  given she is barely waking, no longer taking any PO.  Discharge  Planning: Hospice facility      Primary Diagnoses: Present on Admission: . Encephalopathy acute   I have reviewed the medical record, interviewed the patient and family, and examined the patient. The following aspects are pertinent.  Past Medical History:  Diagnosis Date  . Arthritis    "everywhere"  . Dementia ([place])   . High cholesterol   . History of blood transfusion    "related to my hip OR"  . History of gout   . Hypertension   . Hypothyroidism   . Migraine    "q 5-6 years" (06/06/2014)   Social History   Socioeconomic History  . Marital status: Married    Spouse name: Not on file  . Number of children: Not on file  . Years of education: Not on file  . Highest education level: Not on file  Occupational History  . Not on file  Tobacco Use  . Smoking status: Former Smoker    Packs/day: 2.00    Years: 40.00    Pack years: 80.00    Types: Cigarettes    Quit date: 05/05/1987    Years since quitting: 32.2  . Smokeless tobacco: Never Used  Substance and Sexual Activity  . Alcohol use: Yes    Alcohol/week: 7.0 standard drinks    Types: 7 Glasses of wine per week  . Drug use: No  . Sexual activity: Not Currently  Other Topics Concern  . Not on file  Social History Narrative  . Not on file   Social Determinants of Health   Financial Resource Strain:   . Difficulty of Paying Living Expenses: Not on file  Food Insecurity:   . Worried About Charity fundraiser in the Last Year: Not on file  . Ran Out of Food in the Last Year: Not on file  Transportation Needs:   . Lack of Transportation (Medical): Not on file  . Lack of Transportation (Non-Medical): Not on file  Physical Activity:   . Days of Exercise per Week: Not on file  . Minutes of Exercise per Session: Not on file  Stress:   . Feeling of Stress : Not on file  Social Connections:   . Frequency of Communication with Friends and Family: Not on file  . Frequency of Social Gatherings with Friends and  Family: Not on file  . Attends Religious Services: Not on file  . Active Member of Clubs or Organizations: Not on file  . Attends Archivist Meetings: Not on file  . Marital Status: Not on file   Family History  Problem Relation Age of Onset  . Heart attack Father  Scheduled Meds: . aspirin EC  81 mg Oral Daily  . chlorhexidine  15 mL Mouth/Throat BID  . citalopram  20 mg Oral Daily  . divalproex  125 mg Oral BID  . enoxaparin (LOVENOX) injection  30 mg Subcutaneous Q24H  . hydrocortisone cream   Topical BID  . levothyroxine  50 mcg Oral Q0600  . simvastatin  40 mg Oral QHS   Continuous Infusions: . cefTRIAXone (ROCEPHIN)  IV 1 g (07/20/19 1747)   PRN Meds:. Allergies  Allergen Reactions  . Codeine Swelling and Other (See Comments)    Lips swell  . Aripiprazole Other (See Comments)    Mental status changes  . Penicillins Swelling and Other (See Comments)    Lips became swollen Did it involve swelling of the face/tongue/throat, SOB, or low BP? Yes Did it involve sudden or severe rash/hives, skin peeling, or any reaction on the inside of your mouth or nose? Unk Did you need to seek medical attention at a hospital or doctor's office? Unk When did it last happen?1st recorded in 2015 If all above answers are "NO", may proceed with cephalosporin use.   . Prednisone Other (See Comments)    Mental status changes  . Tramadol Other (See Comments)    Reaction not recalled   Review of Systems unable to obtain.  Physical Exam  Elderly female, sleeping, wakes to gentle touch and voice.   Mumbles but does not become alert CV rrr no m/r/g resp no distress Abdomen soft, nt,nd Extremities are without edema.  Vital Signs: BP 123/73 (BP Location: Left Arm)   Pulse 81   Temp (!) 97.4 F (36.3 C) (Oral)   Resp 18   Ht 5' (1.524 m)   Wt 50.8 kg   SpO2 98%   BMI 21.87 kg/m  Pain Scale: 0-10   Pain Score: 0-No pain   SpO2: SpO2: 98 % O2 Device:SpO2: 98 % O2  Flow Rate: .   IO: Intake/output summary:   Intake/Output Summary (Last 24 hours) at 07/21/2019 1047 Last data filed at 07/21/2019 0600 Gross per 24 hour  Intake 2852.94 ml  Output --  Net 2852.94 ml    LBM: Last BM Date: 07/19/19 Baseline Weight: Weight: 50 kg Most recent weight: Weight: 50.8 kg     Palliative Assessment/Data:       Time In:  2:30 Time Out:  3:30 Time Total: 1 hour. Visit consisted of counseling and education dealing with the complex and emotionally intense issues surrounding the need for palliative care and symptom management in the setting of serious and potentially life-threatening illness. Greater than 50%  of this time was spent counseling and coordinating care related to the above assessment and plan.  Signed by:  , PA-C Palliative Medicine Pager: [phone]  Please contact Palliative Medicine Team phone at [phone] for questions and concerns.  For individual provider: See Amion               

## 2019-07-21 NOTE — Progress Notes (Signed)
   Subjective: Nursing reports no change in patient's mentation. On my exam she intermittently responds to questions, but primarily repeats what I say. Does not appear in any pain or distress.   Objective:  Vital signs in last 24 hours: Vitals:   07/20/19 1836 07/20/19 2020 07/20/19 2328 07/21/19 0332  BP: 125/68 (!) 110/59 (!) 112/53 135/62  Pulse: 75 80 73 73  Resp: 18 16 17 17   Temp: (!) 97.5 F (36.4 C) 97.7 F (36.5 C) (!) 97.4 F (36.3 C) (!) 97.4 F (36.3 C)  TempSrc: Oral Oral Oral Oral  SpO2: 95% 96% 98% 97%  Weight:      Height:       General: elderly, frail female resting comfortably in bed CV: RRR Pulm: normal work of breathing on room air; lungs CTA anteriorly Abd: soft, non-tender, non-distended Neuro: does not open eyes, mumbles, answers questions and follows commands intermittently   Assessment/Plan:  Active Problems:   Encephalopathy acute  [name] is a 86 year old female with a past medical history of Alzheimer's dementia, hypertension, hypothyroidism, currently living at a memory care unit who presents to the ED with complaints of altered mental status and currently admitted for acute encephalopathy.   # Acute metabolic encephalopathy in the setting of Alzheimer's Dementia  Etiology likely multifactorial including hypernatremia from decreased PO intake, mild hypercalcemia, both of which are improving with IVF. We are also treating for possible UTI with Rocephin. TSH was normal and depakote level was not supratherapeutic. CT head was negative. After speaking with daughter, decision was made to not pursue MRI. Her overall wishes for her mother's care are to avoid intensive or invasive work-up, but to intervene on issues that are easily correctable. Unfortunately, she has not shown much improvement despite correcting hypernatremia and treating UTI. Greatly appreciate palliative care consult and assistance with family discussion. After extensive  discussion with both daughters, decision was made to transition [name] to full comfort care. Consult has been placed for transition to [facility] when a bed is available.  - d/c antibiotics and fluids - no lab draws - comfort measures per palliative    Dispo: Anticipated discharge to [facility] when bed becomes available.   [facility], DO 07/21/2019, 7:48 AM Pager: [phone]

## 2019-07-21 NOTE — Progress Notes (Signed)
The chaplain responded to a consult and spoke with the patient's nurse.  The consult requested support for the daughter at bedside.  The daughter had already left by the time the chaplain had arrived.  The chaplain does not assess a need to follow-up.  Lavone Neri Chaplain Resident For questions concerning this note please contact me by pager [phone]

## 2019-07-21 NOTE — Progress Notes (Signed)
[facility] - Civil engineer, contracting  Received request from [employer], [place] for family interest in [facility]. Chart reviewed and attempted contact with daughter Corrie Dandy. Left voice message requesting call back. [facility] manager Marisue Ivan is aware of this referral.   [facility] liaison will provide updates tomorrow.   Please do not hesitate to call with [facility] questions.   Hospital team listed daily on AMION under [facility] [place].  Thank you,  Forrestine Him, LCSW [phone]

## 2019-07-22 DIAGNOSIS — Z885 Allergy status to narcotic agent status: Secondary | ICD-10-CM

## 2019-07-22 DIAGNOSIS — Z888 Allergy status to other drugs, medicaments and biological substances status: Secondary | ICD-10-CM

## 2019-07-22 DIAGNOSIS — Z880 Allergy status to penicillin: Secondary | ICD-10-CM

## 2019-07-22 NOTE — Progress Notes (Signed)
CSW notified by [facility] admissions that there are no beds available today. CSW to follow.  Blenda Nicely, [place] Clinical Social Worker [phone]

## 2019-07-22 NOTE — Progress Notes (Signed)
Report called to staff at [facility] place.

## 2019-07-22 NOTE — Progress Notes (Signed)
[facility] Documentation   Pt has been approved for [facility] transfer and all paperwork has been completed. [facility] does have a bed available for pt today.   Please call [facility] at [phone] to give charge nurse report and fax discharge summary to [phone].   Please dc any lines. May leave catheter in place if pt has one. Please send pt to [facility] with DNR paperwork.    Please call with any questions.    Thank you,  Jennifer Love, RN [facility] Liaison [phone] 

## 2019-07-22 NOTE — Progress Notes (Signed)
  Date: 07/22/2019  Patient name: Diamond [name]  Medical record number: [id number]  Date of birth: 01/16/1927        I have seen and evaluated this patient and I have discussed the plan of care with the house staff. Please see their note for complete details. I concur with their findings with the following additions/corrections: Ms Falter has been changed to comfort care and is being transferred to [facility] today.  Burns Spain, MD 07/22/2019, 4:40 PM

## 2019-07-22 NOTE — Progress Notes (Addendum)
Nutrition Brief Note  Chart reviewed. Pt currently full comfort care.  No nutrition interventions warranted at this time.  Please consult as needed.   Roslyn Smiling, MS, RD, LDN Pager # [phone] After hours/ weekend pager # [phone]

## 2019-07-22 NOTE — Progress Notes (Signed)
[facility] does not have bed availability for pt today. TOC and pt's daughter notified. [facility] will inform both TOC and pt's daughter as soon as a bed does become available.   Thank you,  Trena Platt, RN [facility] Liaison [phone]

## 2019-07-22 NOTE — Progress Notes (Signed)
   Subjective:   Resting comfortably. We did not push to wake her up at this time.   Objective:  Vital signs in last 24 hours: Vitals:   07/21/19 0806 07/21/19 1128 07/22/19 0810 07/22/19 1027  BP: 123/73 111/69 (!) 118/56 130/65  Pulse: 81 89 (!) 104 95  Resp: 18 17 (!) 32   Temp: (!) 97.4 F (36.3 C) 98.7 F (37.1 C) 98.2 F (36.8 C)   TempSrc: Oral Oral Oral   SpO2: 98% 95% 96% 93%  Weight:      Height:        Physical Exam Vitals and nursing note reviewed.  Constitutional:      General: She is sleeping. She is not in acute distress. Pulmonary:     Effort: Pulmonary effort is normal. No respiratory distress.    Assessment/Plan:  Active Problems:   Encephalopathy acute   Late onset Alzheimer's disease without behavioral disturbance (HCC)   Palliative care encounter   Comfort measures only status  Diamond [name] is a 86 year old female with a past medical history of Alzheimer's dementia, hypertension, hypothyroidism, currently living at a memory care unit who presents to the ED with complaints of altered mental status and currently admitted for acute encephalopathy.   # Acute metabolic encephalopathy in the setting of Alzheimer's Dementia  Likely progression of Alzheimer's, as reversible causes have been treated. Family has met with Palliative and have decided to move forward with Comfort care, as of yesterday. No further work up or interventions at this time, other than needed to provide comfort.   - comfort measures per palliative   Dispo: Anticipated discharge to [facility] once bed is available.   Dr. Jose Persia Internal Medicine PGY-1  Pager: [phone] 07/22/2019, 11:41 AM

## 2019-07-22 NOTE — TOC Transition Note (Signed)
Transition of Care [facility]) - CM/SW Discharge Note   Patient Details  Name: MORIA BROPHY MRN: [id number] Date of Birth: 1927-01-10  [facility] of [facility]) CM/SW Contact:  Baldemar Lenis, LCSW Phone Number: 07/22/2019, 3:26 PM   Clinical Narrative:   Nurse to call report to [phone]    Final next level of care: Hospice Medical Facility Barriers to Discharge: Barriers Resolved   Patient Goals and CMS Choice        Discharge Placement              Patient chooses bed at: [facility]) Patient to be transferred to facility by: [facility] Name of family member notified: Daughter Patient and family notified of of transfer: 07/22/19  Discharge Plan and Services                                     Social Determinants of Health (SDOH) Interventions     Readmission Risk Interventions No flowsheet data found.

## 2019-07-22 NOTE — Discharge Summary (Signed)
Name: Diamond [name] MRN: [id number] DOB: 1927/02/02 86 y.o. PCP: Patient, No Pcp Per  Date of Admission: 07/19/2019 11:20 AM Date of Discharge: 07/22/2019 Attending Physician: Burns Spain, MD  Discharge Diagnosis: 1. Acute Encephalopathy  Discharge Medications: Allergies as of 07/22/2019      Reactions   Codeine Swelling, Other (See Comments)   Lips swell   Aripiprazole Other (See Comments)   Mental status changes   Penicillins Swelling, Other (See Comments)   Lips became swollen Did it involve swelling of the face/tongue/throat, SOB, or low BP? Yes Did it involve sudden or severe rash/hives, skin peeling, or any reaction on the inside of your mouth or nose? Unk Did you need to seek medical attention at a hospital or doctor's office? Unk When did it last happen?1st recorded in 2015 If all above answers are "NO", may proceed with cephalosporin use.   Prednisone Other (See Comments)   Mental status changes   Tramadol Other (See Comments)   Reaction not recalled      Medication List    STOP taking these medications   acetaminophen 500 MG tablet Commonly known as: TYLENOL   aspirin 81 MG chewable tablet   BOOST PO   calcium-vitamin D 500-200 MG-UNIT tablet Commonly known as: OSCAL WITH D   ciprofloxacin 500 MG tablet Commonly known as: CIPRO   citalopram 20 MG tablet Commonly known as: CELEXA   Daily-Vite Multivitamin Tabs   diphenhydramine-acetaminophen 25-500 MG Tabs tablet Commonly known as: TYLENOL PM   divalproex 125 MG DR tablet Commonly known as: DEPAKOTE   Dulcolax 10 MG suppository Generic drug: bisacodyl   Ferrex 150 150 MG capsule Generic drug: iron polysaccharides   guaiFENesin 100 MG/5ML liquid Commonly known as: ROBITUSSIN   hydrocortisone 2.5 % cream   Imodium A-D 2 MG tablet Generic drug: loperamide   levothyroxine 50 MCG tablet Commonly known as: SYNTHROID   Melatonin 3 MG Tabs   meloxicam 7.5 MG tablet Commonly  known as: MOBIC   oxycodone-acetaminophen 2.5-325 MG tablet Commonly known as: PERCOCET   simvastatin 40 MG tablet Commonly known as: ZOCOR   vitamin C 1000 MG tablet   Vitamin D 50 MCG (2000 UT) Caps   Vitamin D3 50 MCG (2000 UT) Tabs   Zinc 100 MG Tabs       Disposition and follow-up:   Ms.Diamond [name] was discharged from [facility] in Stable condition.  At the hospital follow up visit please address:  1.  Comfort Measures only   2.  Labs / imaging needed at time of follow-up: None  3.  Pending labs/ test needing follow-up: None   Hospital Course by problem list: 1. Acute Encephalopathy Patient presented to the ED with altered mental status after being diagnosed with Covid 2 weeks ago.  In this time, patient's appetite had decreased substantially with new onset left-sided leaning. Etiology likely multifactorial including hypernatremia from decreased PO intake, mild hypercalcemia, both of which improved with IVF. We were also treating for possible UTI with Rocephin. TSH was normal and depakote level was not supratherapeutic. CT head was negative.  Unfortunately, patient's mentation had not improved after reversible causes were treated.  Palliative care consult was placed.  After meeting with family, decision was made to place patient on comfort care as changes in mentation may be progression from advanced Alzheimer's dementia.  Patient was discharged on hosp[facility] to [facility].  Discharge Vitals:   BP 130/65   Pulse 95  Temp 98.2 F (36.8 C) (Oral)   Resp (!) 32   Ht 5' (1.524 m)   Wt 50.8 kg   SpO2 93%   BMI 21.87 kg/m   Pertinent Labs, Studies, and Procedures:  CBC Latest Ref Rng & Units 07/21/2019 07/20/2019 07/19/2019  WBC 4.0 - 10.5 K/uL 9.6 10.5 11.2(H)  Hemoglobin 12.0 - 15.0 g/dL 10.7(L) 12.2 13.9  Hematocrit 36.0 - 46.0 % 33.1(L) 40.1 46.8(H)  Platelets 150 - 400 K/uL 394 462(H) 664(H)   BMP Latest Ref Rng & Units 07/21/2019  07/20/2019 07/19/2019  Glucose 70 - 99 mg/dL 158(H) 172(H) 147(H)  BUN 8 - 23 mg/dL 36(H) 61(H) 76(H)  Creatinine 0.44 - 1.00 mg/dL 1.24(H) 1.44(H) 2.10(H)  Sodium 135 - 145 mmol/L 140 149(H) 159(H)  Potassium 3.5 - 5.1 mmol/L 3.1(L) 3.9 4.4  Chloride 98 - 111 mmol/L 106 108 117(H)  CO2 22 - 32 mmol/L 23 28 23   Calcium 8.9 - 10.3 mg/dL 8.7(L) 9.5 10.6(H)     Discharge Instructions: Discharge Instructions    Discharge instructions   Complete by: As directed    Thank you for allowing [place] to care for you during your hospital stay.      Signed: Dr. Jose Persia Internal Medicine PGY-1  Pager: [phone] 07/22/2019, 1:45 PM

## 2019-08-05 DEATH — deceased

## 2019-08-14 IMAGING — CT CT HEAD W/O CM
4 series · 15 of 47 positions shown, 17 images · non-contrast
Comparison: 07/23/2017

CLINICAL DATA: Fall from couch striking head on refrigerator.

EXAM:
CT HEAD WITHOUT CONTRAST
TECHNIQUE: Contiguous axial images were obtained from the base of the skull
through the vertex without intravenous contrast.

[Series 3: head wo · axial · 0.41mm/px · z∈[-192,-72]mm · 7 of 33 slices shown, 9 images]
[im 5/33  brain]
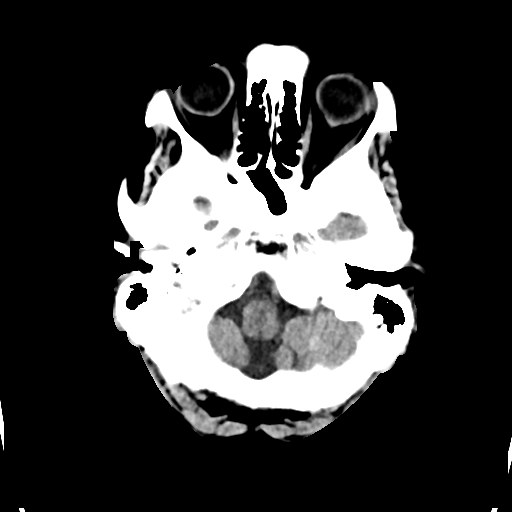
[im 5/33  bone]
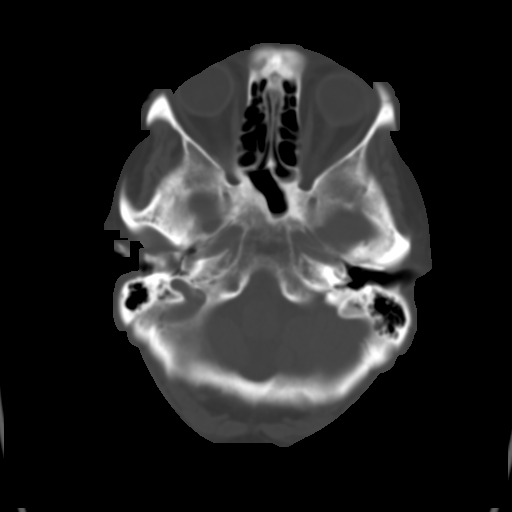
[im 9/33  brain]
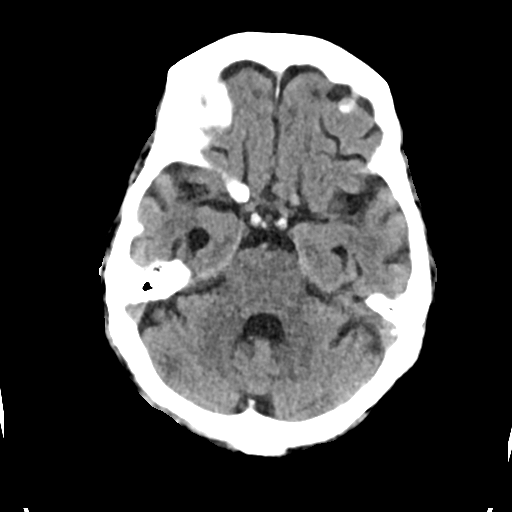
[im 13/33  brain]
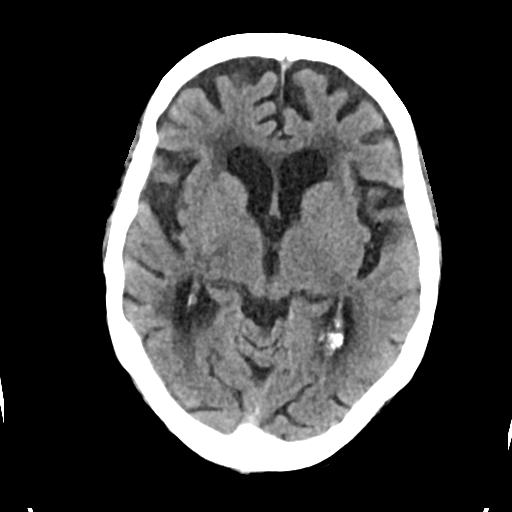
[im 17/33  brain]
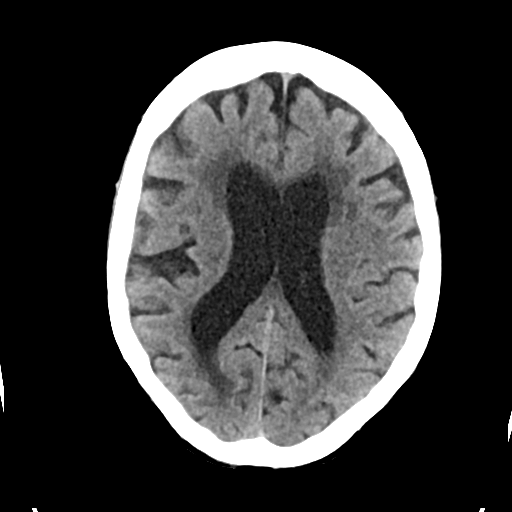
[im 21/33  brain]
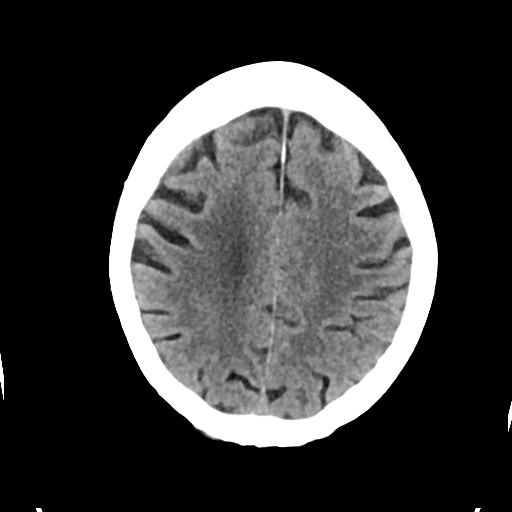
[im 21/33  bone]
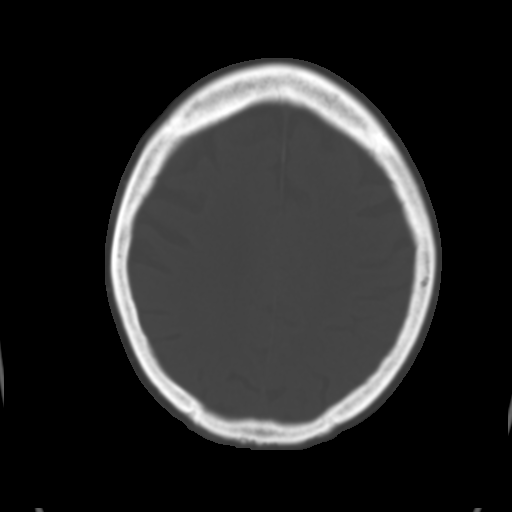
[im 25/33  brain]
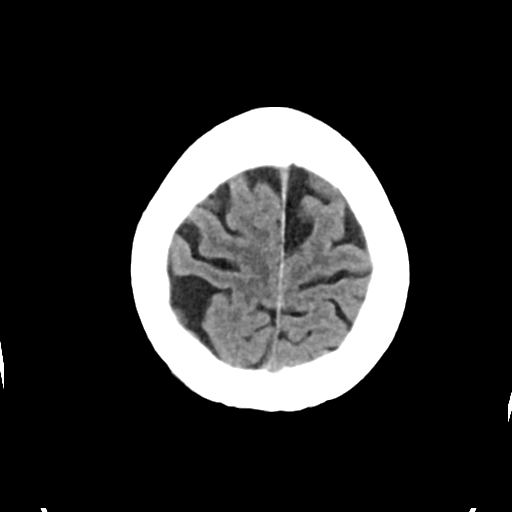
[im 29/33  brain]
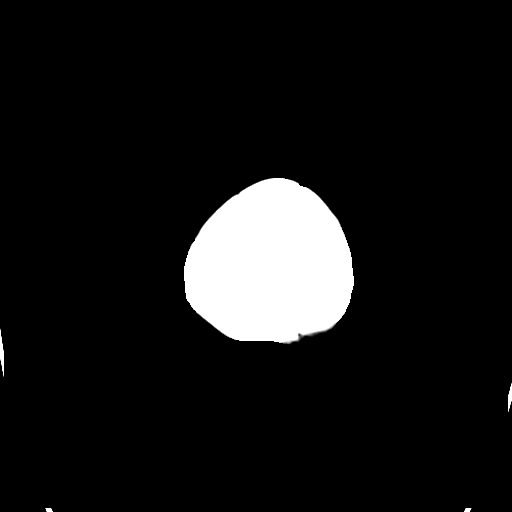

[Series 4: head bone · axial · 0.41mm/px · z∈[-196,-180]mm · 2 of 81 slices shown]
[im 9/81  bone]
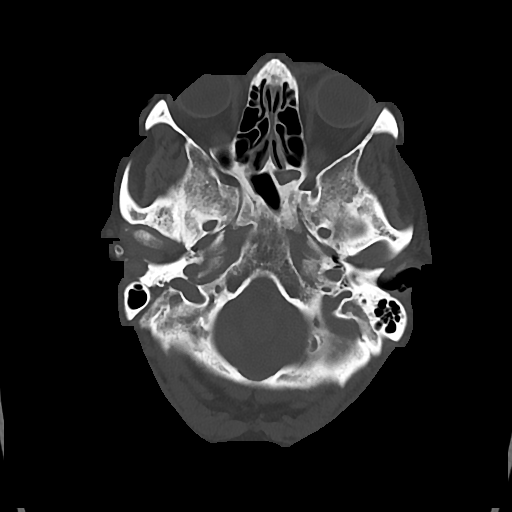
[im 17/81  bone]
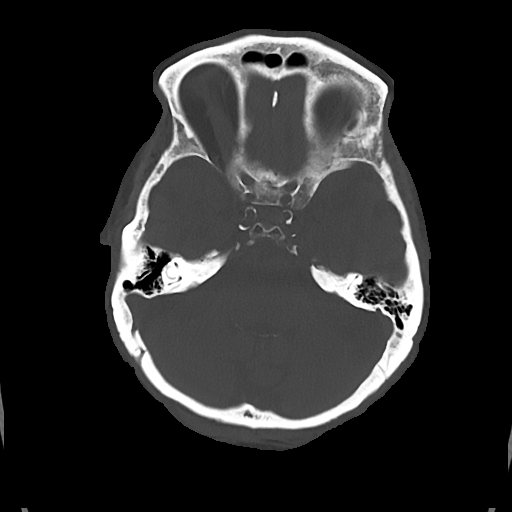

[Series 5: cor soft · coronal · 0.31mm/px · 3 of 61 slices shown]
[im 21/61  brain]
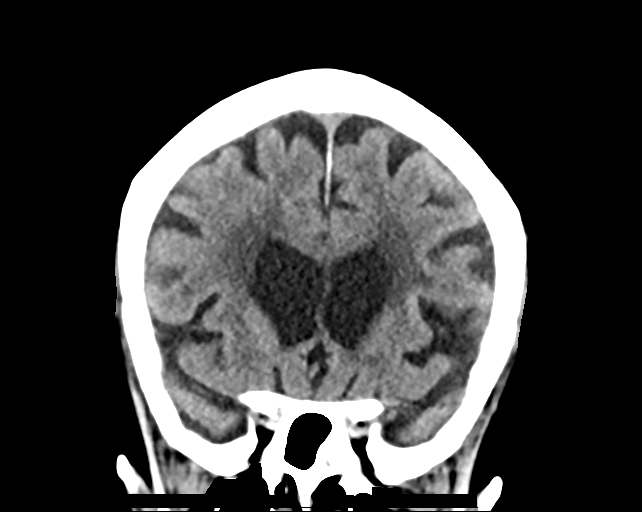
[im 27/61  brain]
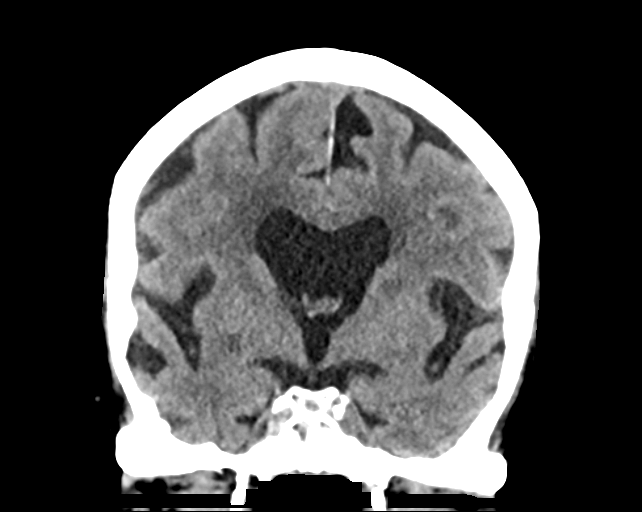
[im 34/61  brain]
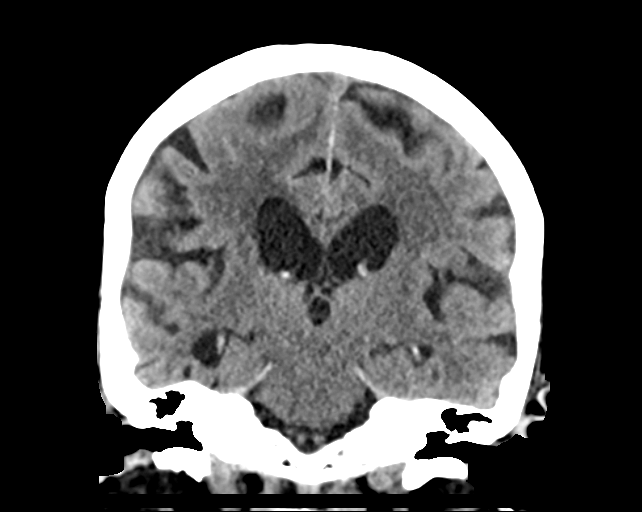

[Series 6: sag soft · sagittal · 0.31mm/px · 3 of 49 slices shown]
[im 17/49  brain]
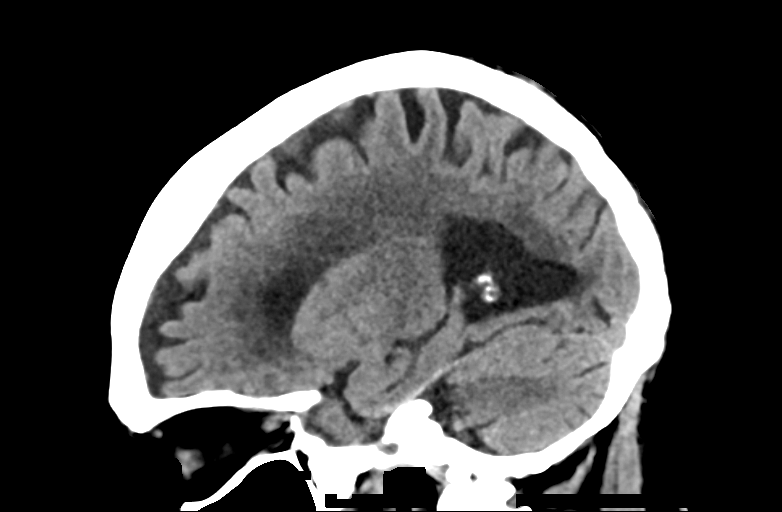
[im 25/49  brain]
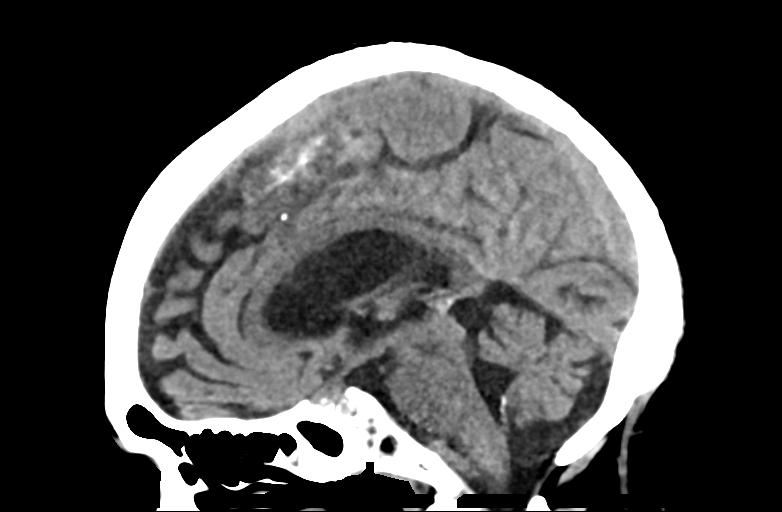
[im 33/49  brain]
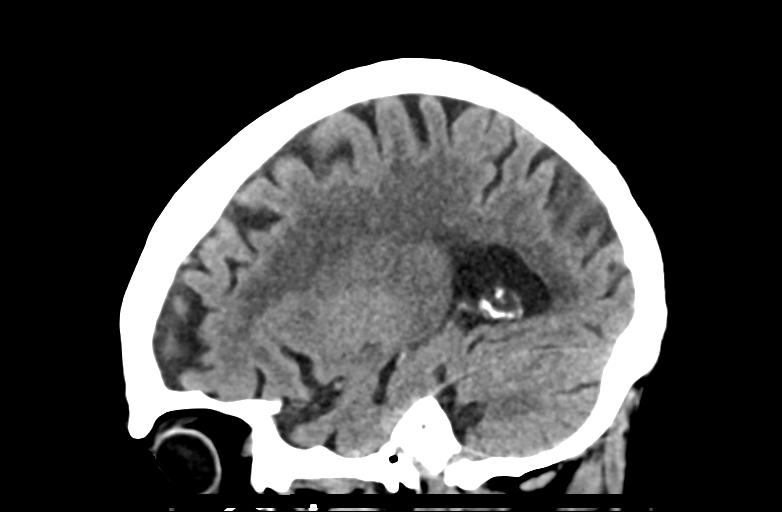

[15 of 47 positions shown; findings below may reference images not displayed]

FINDINGS: Brain: Examination demonstrates the ventricles, cisterns and CSF
spaces to be mildly prominent but within normal for patient's age
and compatible with age related atrophic change. There is chronic
ischemic microvascular disease. There is no mass, mass effect, shift
of midline structures or acute hemorrhage.

Vascular: No hyperdense vessel or unexpected calcification.

Skull: Normal. Negative for fracture or focal lesion.

Sinuses/Orbits: No acute finding.

Other: None.
IMPRESSION: No acute findings.

Chronic ischemic microvascular disease and age related atrophic
change.
